# Patient Record
Sex: Female | Born: 1960
Health system: Southern US, Community
[De-identification: ages and names within clinical notes are randomized; demographics above are authoritative.]

## PROBLEM LIST (undated history)

## (undated) DIAGNOSIS — Z8742 Personal history of other diseases of the female genital tract: Secondary | ICD-10-CM

## (undated) DIAGNOSIS — F419 Anxiety disorder, unspecified: Secondary | ICD-10-CM

## (undated) DIAGNOSIS — G4730 Sleep apnea, unspecified: Secondary | ICD-10-CM

## (undated) DIAGNOSIS — E785 Hyperlipidemia, unspecified: Secondary | ICD-10-CM

## (undated) DIAGNOSIS — M1990 Unspecified osteoarthritis, unspecified site: Secondary | ICD-10-CM

## (undated) DIAGNOSIS — J45909 Unspecified asthma, uncomplicated: Secondary | ICD-10-CM

## (undated) DIAGNOSIS — F32A Depression, unspecified: Secondary | ICD-10-CM

## (undated) DIAGNOSIS — Z9889 Other specified postprocedural states: Secondary | ICD-10-CM

## (undated) DIAGNOSIS — T7840XA Allergy, unspecified, initial encounter: Secondary | ICD-10-CM

## (undated) DIAGNOSIS — M5030 Other cervical disc degeneration, unspecified cervical region: Secondary | ICD-10-CM

## (undated) DIAGNOSIS — Z9089 Acquired absence of other organs: Secondary | ICD-10-CM

## (undated) HISTORY — PX: ADENOIDECTOMY: SUR15

## (undated) HISTORY — DX: Unspecified asthma, uncomplicated: J45.909

## (undated) HISTORY — PX: TONSILLECTOMY: SUR1361

## (undated) HISTORY — DX: Unspecified osteoarthritis, unspecified site: M19.90

## (undated) HISTORY — DX: Other specified postprocedural states: Z87.42

## (undated) HISTORY — DX: Hyperlipidemia, unspecified: E78.5

## (undated) HISTORY — DX: Anxiety disorder, unspecified: F41.9

## (undated) HISTORY — PX: APPENDECTOMY: SHX54

## (undated) HISTORY — DX: Allergy, unspecified, initial encounter: T78.40XA

## (undated) HISTORY — DX: Other specified postprocedural states: Z98.890

## (undated) HISTORY — DX: Sleep apnea, unspecified: G47.30

## (undated) HISTORY — DX: Depression, unspecified: F32.A

## (undated) HISTORY — DX: Other cervical disc degeneration, unspecified cervical region: M50.30

## (undated) HISTORY — DX: Acquired absence of other organs: Z90.89

---

## 1999-11-20 ENCOUNTER — Other Ambulatory Visit: Admission: RE | Admit: 1999-11-20 | Discharge: 1999-11-20 | Payer: Self-pay | Admitting: Family Medicine

## 2002-02-10 ENCOUNTER — Other Ambulatory Visit: Admission: RE | Admit: 2002-02-10 | Discharge: 2002-02-10 | Payer: Self-pay | Admitting: Obstetrics and Gynecology

## 2004-07-26 ENCOUNTER — Inpatient Hospital Stay (HOSPITAL_COMMUNITY): Admission: EM | Admit: 2004-07-26 | Discharge: 2004-07-27 | Payer: Self-pay | Admitting: Internal Medicine

## 2005-02-19 ENCOUNTER — Other Ambulatory Visit: Admission: RE | Admit: 2005-02-19 | Discharge: 2005-02-19 | Payer: Self-pay | Admitting: Obstetrics and Gynecology

## 2005-04-01 ENCOUNTER — Encounter: Admission: RE | Admit: 2005-04-01 | Discharge: 2005-04-01 | Payer: Self-pay | Admitting: Obstetrics and Gynecology

## 2009-03-12 ENCOUNTER — Encounter: Admission: RE | Admit: 2009-03-12 | Discharge: 2009-03-12 | Payer: Self-pay | Admitting: Family Medicine

## 2009-03-12 ENCOUNTER — Other Ambulatory Visit: Admission: RE | Admit: 2009-03-12 | Discharge: 2009-03-12 | Payer: Self-pay | Admitting: Obstetrics and Gynecology

## 2009-04-30 ENCOUNTER — Emergency Department (HOSPITAL_COMMUNITY): Admission: EM | Admit: 2009-04-30 | Discharge: 2009-04-30 | Payer: Self-pay | Admitting: Emergency Medicine

## 2010-05-24 ENCOUNTER — Emergency Department (HOSPITAL_COMMUNITY): Admission: EM | Admit: 2010-05-24 | Discharge: 2010-05-24 | Payer: Self-pay | Admitting: Emergency Medicine

## 2010-12-07 ENCOUNTER — Encounter: Payer: Self-pay | Admitting: Obstetrics and Gynecology

## 2011-04-03 NOTE — Discharge Summary (Signed)
Connie Cook, Connie Cook                  ACCOUNT NO.:  [id number]   MEDICAL RECORD NO.:  [id number]                   PATIENT TYPE:  INP   LOCATION:  0473                                 FACILITY:  [facility]   PHYSICIAN:  Connie Quarry, MD                   DATE OF BIRTH:  [birth year]   DATE OF ADMISSION:  07/25/2004  DATE OF DISCHARGE:                                 DISCHARGE SUMMARY   Connie Cook is a 50 year old lady who initially presented to [facility] on July 25, 2004, with a one-week history of increasing  shortness of breath, wheezing, and cough which was productive of yellowish  phlegm. This was associated with chills, but no apparent objective fever.  The patient had been treated as an outpatient with antibiotic therapy and  albuterol with very limited improvement. A chest x-ray was obtained which  showed no evidence of pneumonia. On July 25, 2004, the patient was  admitted for inpatient treatment of these symptoms.  Physical exam at the  time of admission is described by Dr. Julio Cook to have a blood pressure of  136/86, pulse 91. The patient was afebrile. HEENT exam was within normal  limits. Examination of the chest revealed diffuse wheezing with prolongation  of expiratory phase. There were scattered rhonchi. Cardiovascular exam  revealed a mild tachycardia. There were no murmurs, rubs, or gallops. The  abdomen was benign. Normal bowel sounds without masses, tenderness, or  organomegaly. Neurologic testing and examination of extremities was normal.   On laboratory testing his sodium was 140, potassium 3.6, creatinine 0.8, BUN  12. Liver profile was normal. Hemoglobin was 12.3. BNP 44.6.   Chest x-ray was suggestive of bronchitis on admission. The patient was  placed on nebulizer treatments with albuterol. She was given Solu-Medrol  initially at a dose of 125 mg q.8h. Zithromax was administered one IV dose  and then 250 mg orally. The patient was  given Protonix 40 mg daily as  empiric therapy. The patient's condition gradually improved. I noted on her  physical examination that she had a grade 2/6 systolic murmur best heard at  the left sternal border. I suggested that an echocardiogram be performed to  characterize this, but this could not be done over the weekend. This might  be a consideration as an outpatient. By July 27, 2004, the patient was  much improved, and it was felt reasonable to go ahead and discharge her.   DISCHARGE DIAGNOSES:  1.  Acute asthmatic bronchitis.  2.  History of allergic rhinitis.  3.  Chronic anxiety.   DISCHARGE MEDICATIONS:  The patient will take Zithromax 250 mg daily for  five additional days. She will continue albuterol meter dose inhaler three  puffs q.i.d. She will follow a tapering schedule of prednisone 40 times  three, 30 times three, 20 times three, 10 times three, and then stop. She  was encouraged to follow up with Dr. Kevan Cook  in three to five days. I suggested that she could discuss her status with  Dr. Kevan Cook at that time and decision could be made about return to work.  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   CONDITION AT THE TIME OF DISCHARGE:  Good.                                               Connie Quarry, MD    SY/MEDQ  D:  07/27/2004  T:  07/27/2004  Job:  [id number]   cc:   Connie Cook, M.D.  [address]  [place]  [place] [address]  Fax: [phone]

## 2011-07-11 IMAGING — CT CT EXTREM LOW W/O CM*L*
2 of 3 series · 9 of 14 positions shown, 10 images · non-contrast
Comparison: Plain films earlier this same date.

CLINICAL DATA: Twisting injury and fall.  Pain.

CT LEFT FOOT WITHOUT CONTRAST
TECHNIQUE: Multidetector CT imaging of the left foot was performed
according to the standard protocol without intravenous contrast.
Multiplanar CT image reconstructions were also generated.

[Series 7: extremity 3.0 u90u bone · axial · 0.37mm/px · z∈[-1016,-946]mm · 4 of 39 slices shown, 5 images]
[im 8/39  soft-tissue]
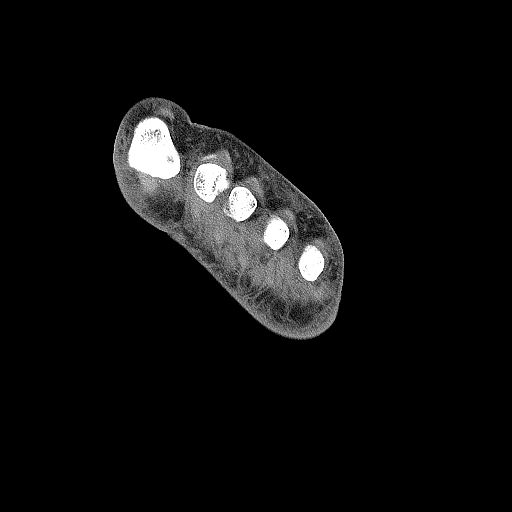
[im 8/39  bone]
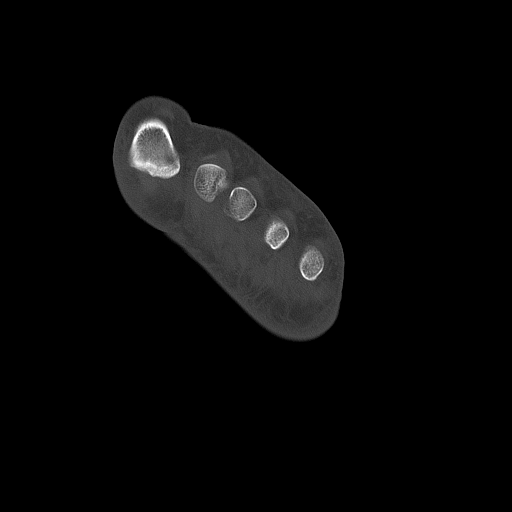
[im 16/39  bone]
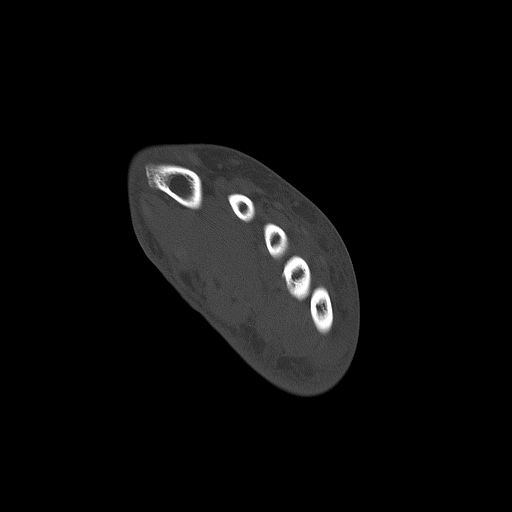
[im 23/39  bone]
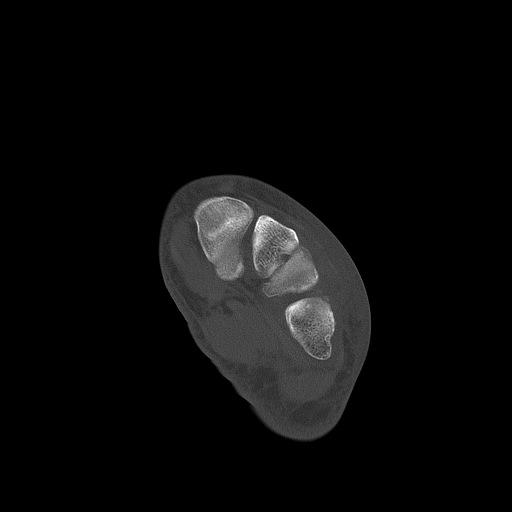
[im 31/39  bone]
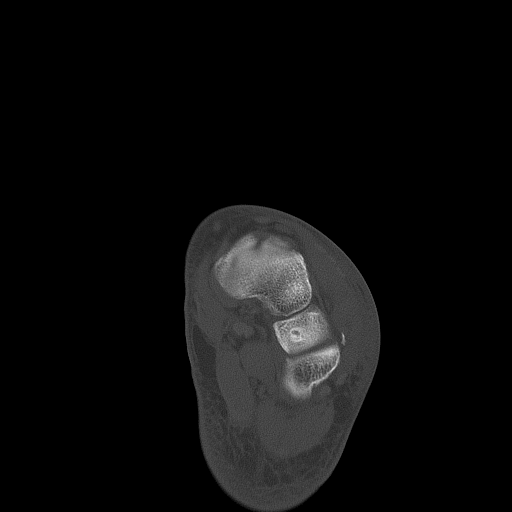

[Series 604: axial metatarsals · axial · 0.37mm/px · z∈[-985,-937]mm · 5 of 39 slices shown]
[im 7/39  bone]
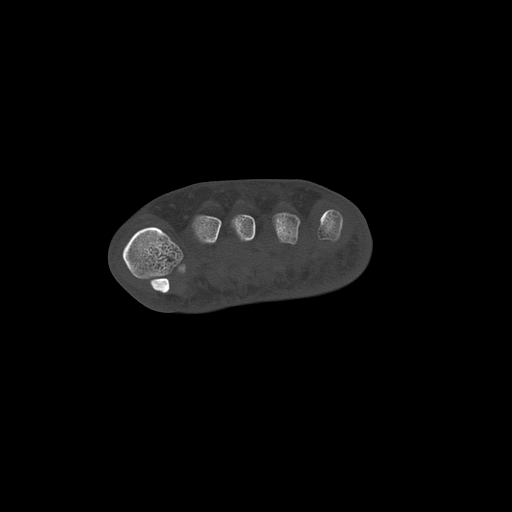
[im 13/39  bone]
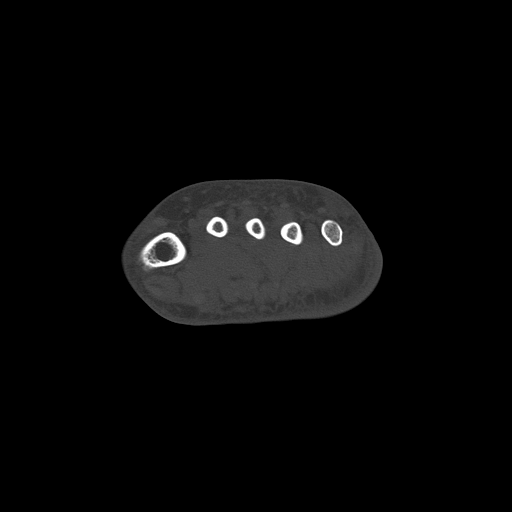
[im 20/39  bone]
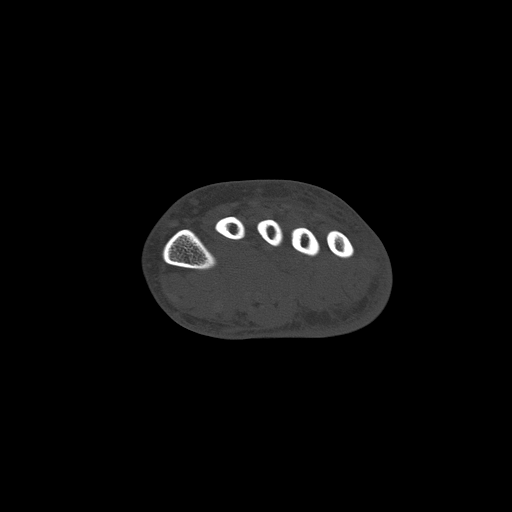
[im 26/39  bone]
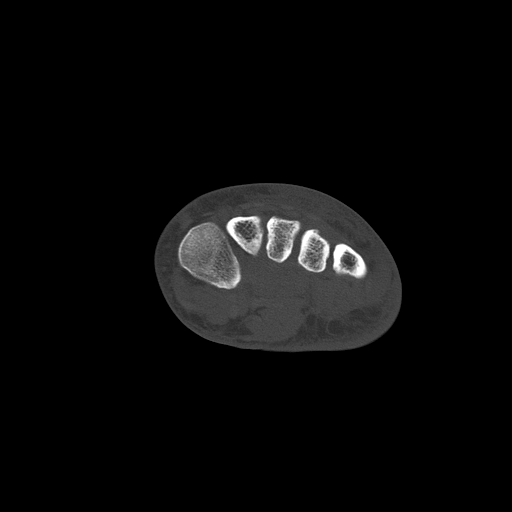
[im 32/39  bone]
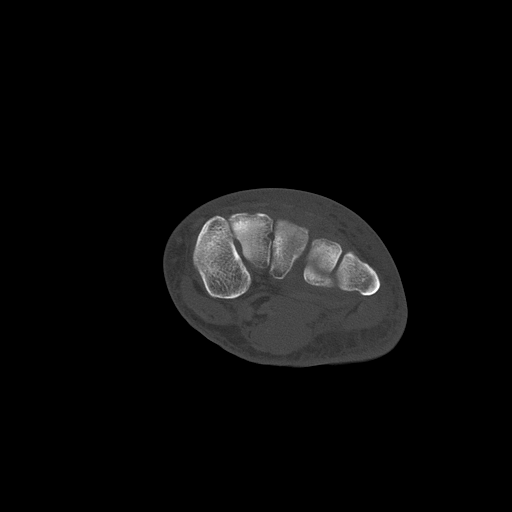

[9 of 14 positions shown; findings below may reference images not displayed]

FINDINGS: A tiny chip fracture seen off the distal aspect of the
calcaneus laterally at the expected location of the origin of the
extensor digitorum brevis  There is no other fracture.  No
dislocation.  Soft tissues of the dorsum of the foot are swollen.
IMPRESSION: Tiny chip fracture off of the distal calcaneus laterally at the
expected location of the origin of the extensor digitorum brevis.
No other fracture.  Soft tissue swelling noted.

## 2011-07-11 IMAGING — CR DG FOOT COMPLETE 3+V*L*
3 series · 3 of 3 positions shown · non-contrast
Comparison: None.

CLINICAL DATA: Injury, pain.

LEFT FOOT - COMPLETE 3+ VIEW

[t foot ap left]
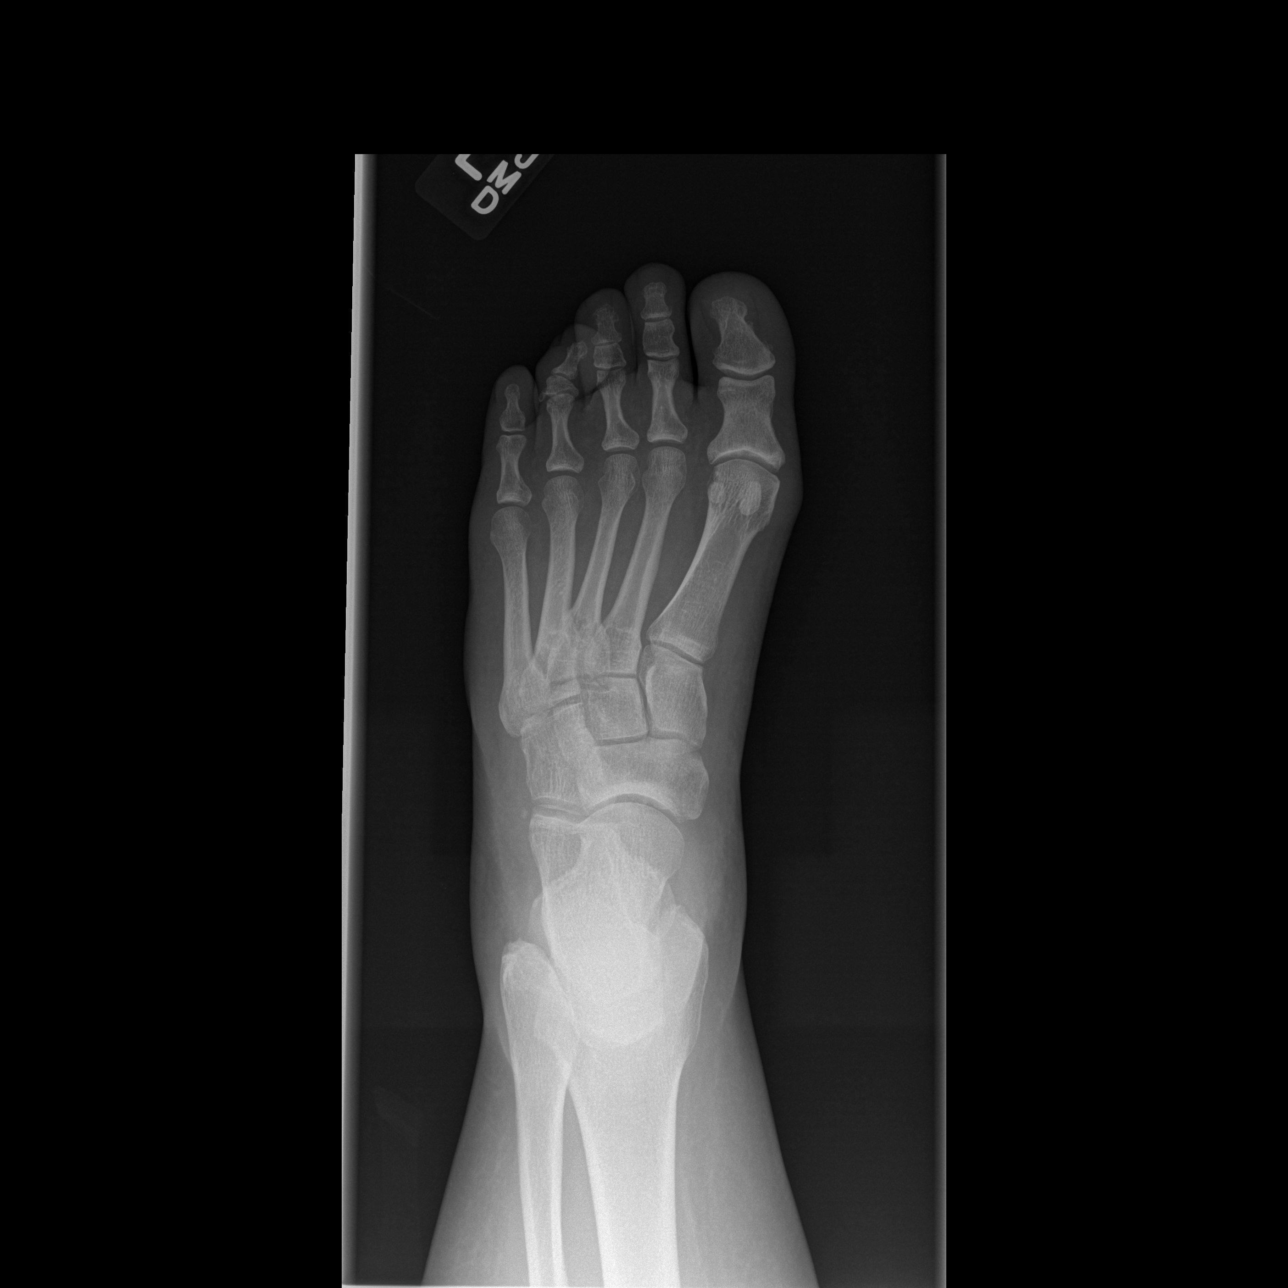

[t foot oblique left]
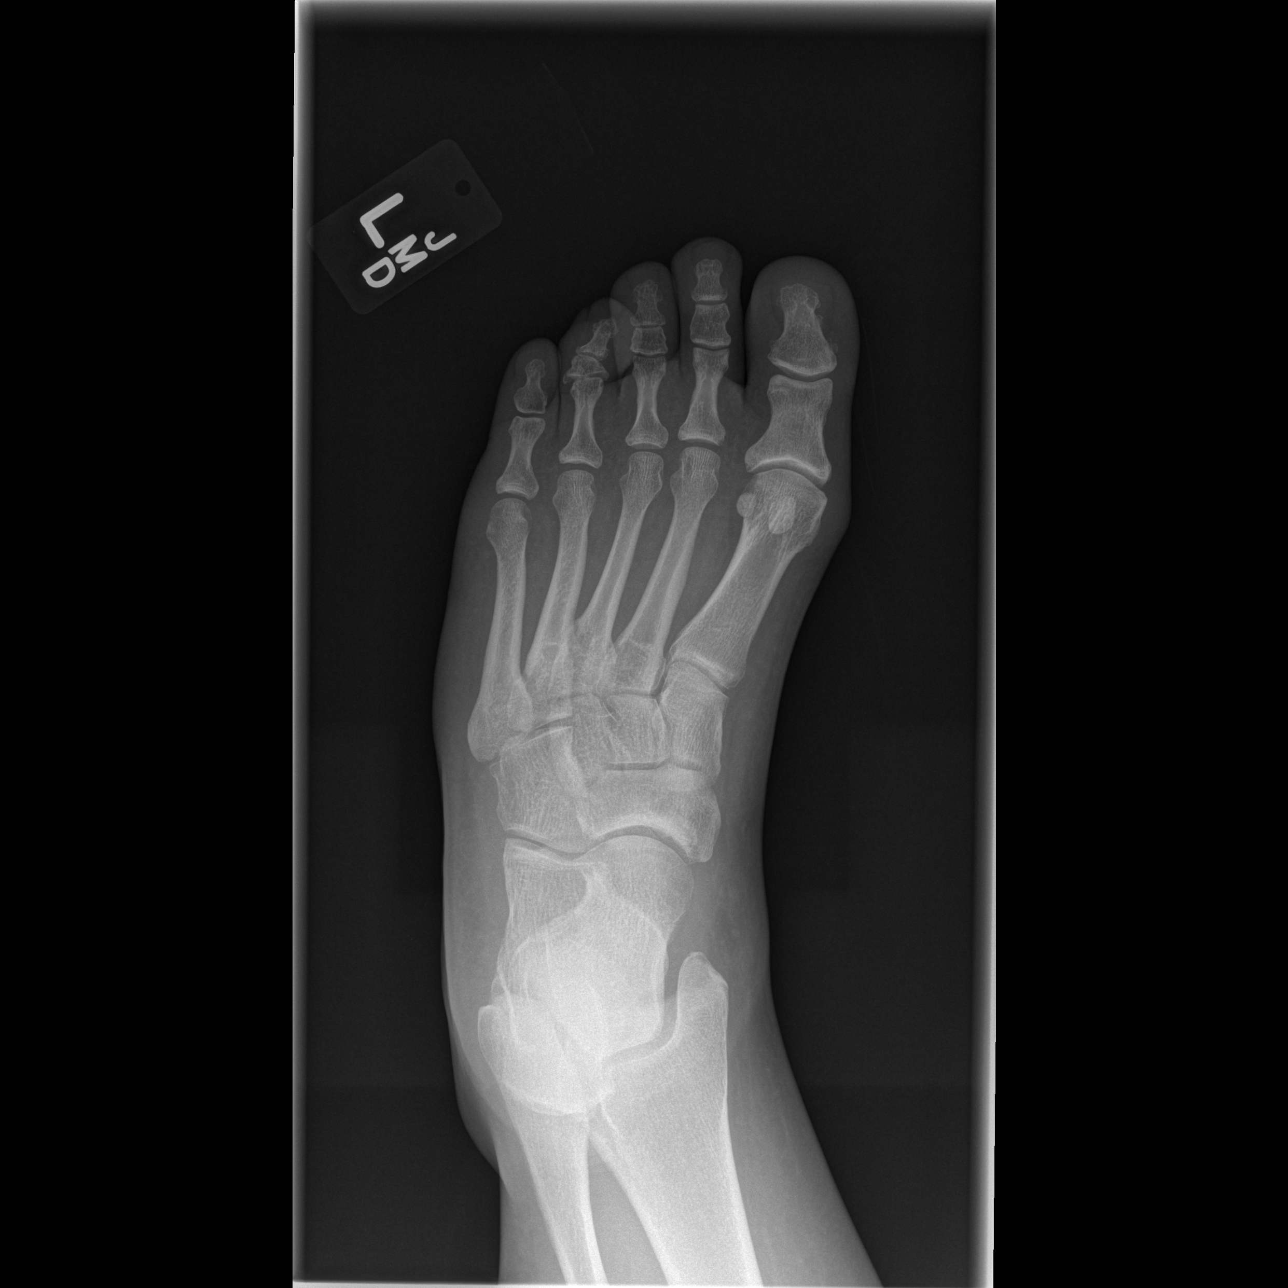

[t foot lat left]
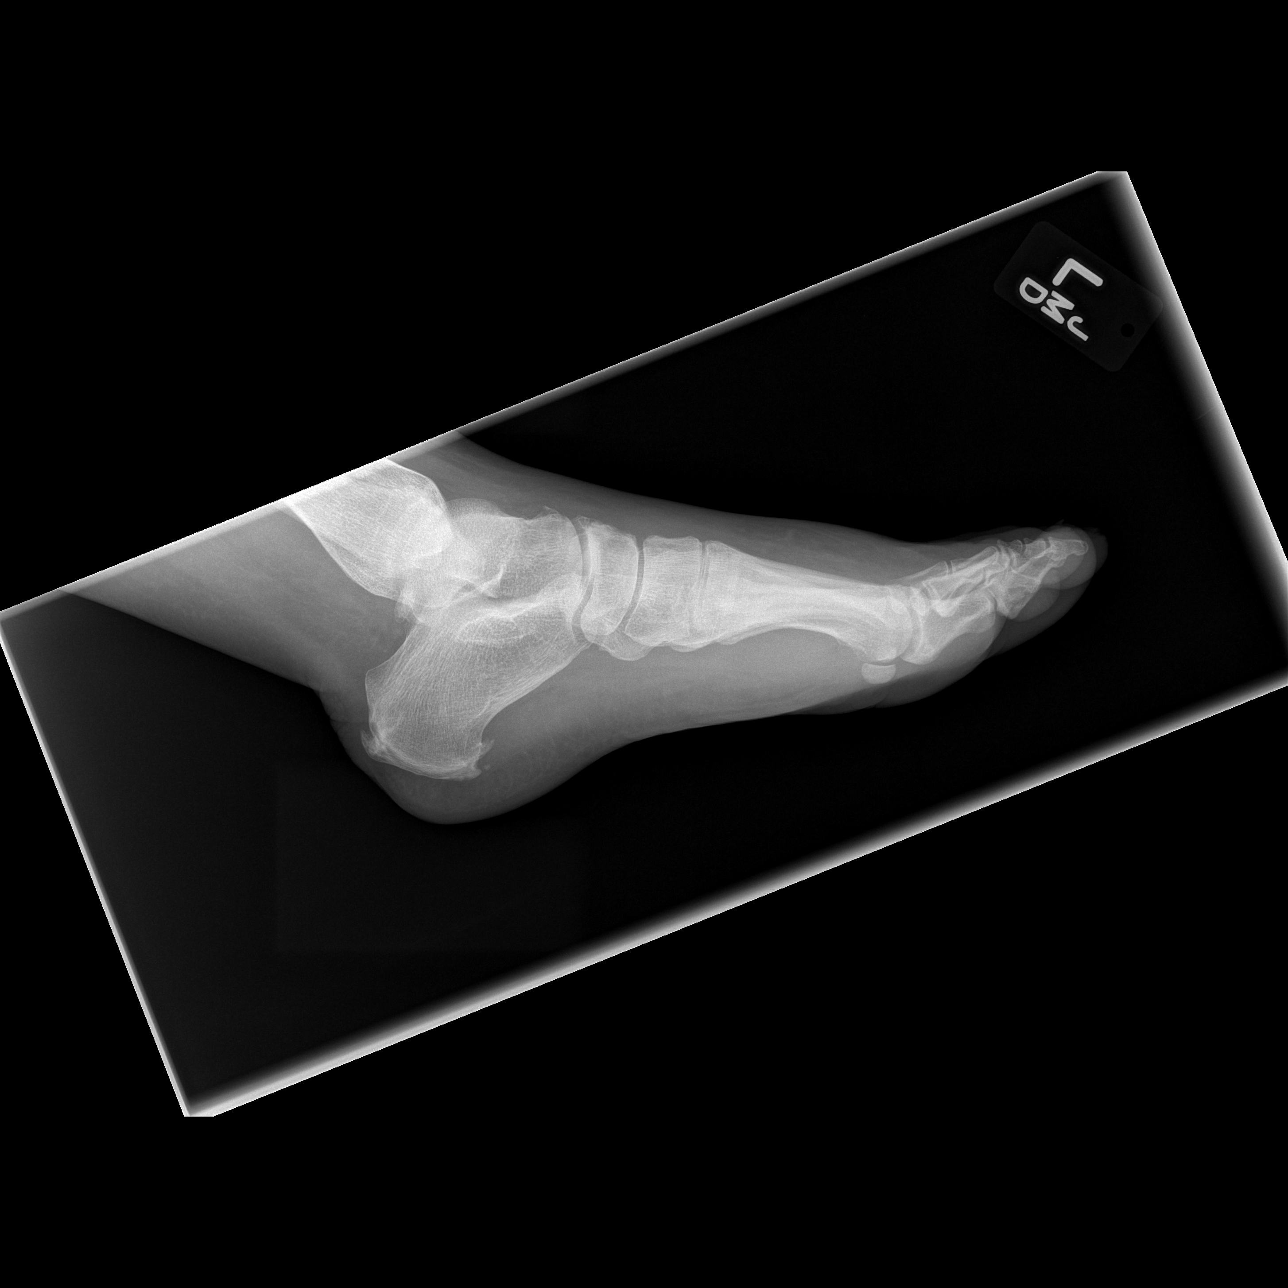

[3 of 3 positions shown; findings below may reference images not displayed]

FINDINGS: There is cortical irregularity at the bases of the second
third and fourth metatarsals.  Imaged bones otherwise unremarkable.
There is some soft tissue swelling of the dorsum of the foot.
IMPRESSION: Possible nondisplaced fractures of the bases of the second, third
and fourth metatarsals.  Clinical correlation for focal tenderness
recommended.  Finding may be due to overlapping structures.

## 2014-07-24 ENCOUNTER — Ambulatory Visit: Payer: Self-pay

## 2014-07-27 ENCOUNTER — Ambulatory Visit (INDEPENDENT_AMBULATORY_CARE_PROVIDER_SITE_OTHER): Payer: Commercial Managed Care - PPO

## 2014-07-27 DIAGNOSIS — M79675 Pain in left toe(s): Secondary | ICD-10-CM

## 2014-07-27 DIAGNOSIS — M2042 Other hammer toe(s) (acquired), left foot: Secondary | ICD-10-CM

## 2014-07-27 DIAGNOSIS — M2040 Other hammer toe(s) (acquired), unspecified foot: Secondary | ICD-10-CM

## 2014-07-27 DIAGNOSIS — M79609 Pain in unspecified limb: Secondary | ICD-10-CM

## 2014-07-27 NOTE — Progress Notes (Signed)
   Subjective:    Patient ID: Connie Cook, female    DOB: [birth year], 53 y.o.   MRN: [id number]  HPI ''LT FOOT 4TH TOE IS PAINFUL.''  PT STATED LT FOOT 4TH TOE IS BEEN PAINFUL FOR 3 MONTHS. THE TOE GETTING WORSE AND DR. Mercy Riding DIAGNOSED SPUR INSIDE OF THE TOE. THE TOE GET AGGRAVATED BY PRESSURE. TREAT WITH CORTISONE SHOT BUT NO HELP.  Review of Systems  Constitutional: Positive for activity change.  Musculoskeletal: Positive for back pain and joint swelling.  All other systems reviewed and are negative.      Objective:   Physical Exam 53 year old white female well-developed well-nourished oriented x3 presents this time a referral from Dr. Elijah Birk. Patient has a history of painful fourth toe left foot patient's x-rays confirm at this time there is some digital contracture hammertoe deformity with posse exostosis) and associated corn lateral fourth toe. The extremity objective findings vascular status appears to be intact with pedal pulses palpable DP and PT +2/4 bilateral capillary refill time 3 seconds all digits. Epicritic and proprioceptive sensations intact and symmetric bilateral normal plantar response DTRs not listed neurologically skin color pigment normal hair growth absent nails unremarkable there is keratoses lateral fifth digit compression lateral fourth digit left foot over the IP joint proximally. Orthopedic biomechanical exam reveals hammertoe deformity fourth and fifth toes with under lapping fourth toe adductovarus rotation and fifth toes were rectus x-rays confirm hammertoe deformity with some arthrosis of the middle phalanx and temperature to a proximal phalanx head. Again hammertoe deformity with associated overlying keratoses noted painful symptomatic patients tried padding changing shoes cushioning although or no success and is requesting surgery at this time. There is no open wound ulcer no secondary infection mild keratoses first 2 foam padding is dispensed at this  time.       Assessment & Plan:  Assessment hammertoe deformity with mild arthrosis of the fourth digit proximal IP joint and middle phalanx at this time is for hammertoe arthroplasty/arthrodesis with pin fixation fourth digit left foot risk and complications are reviewed with the patient all questions asked medication are answered there no contraindications to the proposed surgery and surgery proceeded scheduled at this time per patient request again likely hammertoe repair with K wire fixation under IV sedation local anesthetic block schedule surgery maintain tube foam padding in the interim will likely need out of work for proxy 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

## 2014-07-27 NOTE — Patient Instructions (Signed)
Pre-Operative Instructions  Congratulations, you have decided to take an important step to improving your quality of life.  You can be assured that the doctors of [facility] will be with you every step of the way.  1. Plan to be at the surgery center/hospital at least 1 (one) hour prior to your scheduled time unless otherwise directed by the surgical center/hospital staff.  You must have a responsible adult accompany you, remain during the surgery and drive you home.  Make sure you have directions to the surgical center/hospital and know how to get there on time. 2. For hospital based surgery you will need to obtain a history and physical form from your family physician within 1 month prior to the date of surgery- we will give you a form for you primary physician.  3. We make every effort to accommodate the date you request for surgery.  There are however, times where surgery dates or times have to be moved.  We will contact you as soon as possible if a change in schedule is required.   4. No Aspirin/Ibuprofen for one week before surgery.  If you are on aspirin, any non-steroidal anti-inflammatory medications (Mobic, Aleve, Ibuprofen) you should stop taking it 7 days prior to your surgery.  You make take Tylenol  For pain prior to surgery.  5. Medications- If you are taking daily heart and blood pressure medications, seizure, reflux, allergy, asthma, anxiety, pain or diabetes medications, make sure the surgery center/hospital is aware before the day of surgery so they may notify you which medications to take or avoid the day of surgery. 6. No food or drink after midnight the night before surgery unless directed otherwise by surgical center/hospital staff. 7. No alcoholic beverages 24 hours prior to surgery.  No smoking 24 hours prior to or 24 hours after surgery. 8. Wear loose pants or shorts- loose enough to fit over bandages, boots, and casts. 9. No slip on shoes, sneakers are best. 10. Bring  your boot with you to the surgery center/hospital.  Also bring crutches or a walker if your physician has prescribed it for you.  If you do not have this equipment, it will be provided for you after surgery. 11. If you have not been contracted by the surgery center/hospital by the day before your surgery, call to confirm the date and time of your surgery. 12. Leave-time from work may vary depending on the type of surgery you have.  Appropriate arrangements should be made prior to surgery with your employer. 13. Prescriptions will be provided immediately following surgery by your doctor.  Have these filled as soon as possible after surgery and take the medication as directed. 14. Remove nail polish on the operative foot. 15. Wash the night before surgery.  The night before surgery wash the foot and leg well with the antibacterial soap provided and water paying special attention to beneath the toenails and in between the toes.  Rinse thoroughly with water and dry well with a towel.  Perform this wash unless told not to do so by your physician.  Enclosed: 1 Ice pack (please put in freezer the night before surgery)   1 Hibiclens skin cleaner   Pre-op Instructions  If you have any questions regarding the instructions, do not hesitate to call our office.  [place]: [address] [place], [place] [address] [phone]  [place]: [address] [place], [place] [address] [phone]  [place]: [address]  , [place] [address] [phone]  Dr. Mollyann Halbert   Tuchman DPM, Dr. Norman Regal DPM Dr. Josiah Wojtaszek DPM, Dr. M. Todd Hyatt DPM, Dr. Kathryn Egerton DPM 

## 2014-07-30 ENCOUNTER — Telehealth: Payer: Self-pay | Admitting: *Deleted

## 2014-07-30 NOTE — Telephone Encounter (Signed)
I want to know if it's possible that Dr. Ralene Cork does surgeries at the hospital opposed to the surgical center?  Just want to know because I know the hospital makes payment arrangements.  Thank you, bye bye.  I called and left her a message that Dr. Ralene Cork does surgery at [facility] on rare occasions.  If you decide to go that route, you will have to have a physical done 7 days prior to surgery and you will have to see Dr. Ralene Cork again for a consultation to sign another consent form for [facility].  Call on tomorrow if you have any further questions.

## 2014-08-06 DIAGNOSIS — M2040 Other hammer toe(s) (acquired), unspecified foot: Secondary | ICD-10-CM

## 2014-08-08 ENCOUNTER — Telehealth: Payer: Self-pay | Admitting: *Deleted

## 2014-08-08 NOTE — Telephone Encounter (Signed)
I want to know if I can drive.  I have a doctor's appointment, that was set up ahead of time, tomorrow for a physical.  If I need to cancel it I will.  Give me a call.    I called and left the patient a message that she can drive but not under the influence of the narcotic.  Okay to drive being that it is your left foot.

## 2014-08-10 ENCOUNTER — Telehealth: Payer: Self-pay

## 2014-08-10 NOTE — Telephone Encounter (Signed)
Spoke with pt regarding post operative status, she states that she is doing well and is managing her pain effectively. Advised to remain in boot/shoe at all times and t o ice and elevate.

## 2014-08-14 ENCOUNTER — Ambulatory Visit (INDEPENDENT_AMBULATORY_CARE_PROVIDER_SITE_OTHER): Payer: Commercial Managed Care - PPO

## 2014-08-14 VITALS — BP 156/90 | HR 93 | Resp 14

## 2014-08-14 DIAGNOSIS — Z09 Encounter for follow-up examination after completed treatment for conditions other than malignant neoplasm: Secondary | ICD-10-CM

## 2014-08-14 DIAGNOSIS — M2040 Other hammer toe(s) (acquired), unspecified foot: Secondary | ICD-10-CM

## 2014-08-14 NOTE — Patient Instructions (Signed)

## 2014-08-14 NOTE — Progress Notes (Signed)
   Subjective:    Patient ID: Connie RutherfordPatsy [name] Cook, female    DOB: 10/24/1961, 53 y.o.   MRN: [id number]  HPI Comments: DOS 08/06/2014 left 4th hammer toe repair with pin.  Pt states she is doing fine, but has a cold.     Review of Systems no new findings or systemic changes noted     Objective:   Physical Exam Neurovascular status appears to be intact pedal pulses palpable dressings intact and dry however should note that the pin and the fourth toe left foot has backed out not in position [facility] taken at this time demonstrated the pain is only in the distal phalanx and middle phalanx is then extended to the proximal not providing a stability this time the pain is easily removed from the end of the toe minimal or no drainage or bleeding noted minimal edema and ecchymosis consistent with postop course at this time and dressed to compressive dressing was reapplied to the left foot maintaining the toe in a rectus position with with wrapping and Coflex patient will maintain Darco shoe as instructed. Should note neurovascular status is intact pedal pulses palpable adequate resection of prominent bone was noted on x-ray although fixation did back out prematurely removed.       Assessment & Plan:  Assessment postop progress following hammertoe repair excision portion middle phalanx fourth toe left foot. Coflex wrap in reapplied in dry sterile dressing reapplied to left foot return weekly for suture removal at that time the dressings intact and dry he is having minimal or no pain or discomfort clinical good appearance to the digit is noted. Dressing reapplied recheck in one week suture removal plan at that time  Alvan Dameichard Evah Rashid DPM

## 2014-08-17 ENCOUNTER — Telehealth: Payer: Self-pay | Admitting: *Deleted

## 2014-08-17 NOTE — Telephone Encounter (Signed)
The pin he put in my toe had come out at my last visit.  I just want to make sure my toe is going to be okay.  Will he go in and put another pin in it?  I told her probably not.  I told her it should be okay.  She stated, "Okay thank you."

## 2014-08-21 ENCOUNTER — Ambulatory Visit (INDEPENDENT_AMBULATORY_CARE_PROVIDER_SITE_OTHER): Payer: Commercial Managed Care - PPO

## 2014-08-21 VITALS — Resp 14 | Ht 60.5 in | Wt 200.0 lb

## 2014-08-21 DIAGNOSIS — M2042 Other hammer toe(s) (acquired), left foot: Secondary | ICD-10-CM

## 2014-08-21 DIAGNOSIS — Z09 Encounter for follow-up examination after completed treatment for conditions other than malignant neoplasm: Secondary | ICD-10-CM

## 2014-08-21 NOTE — Patient Instructions (Signed)
ANTIBACTERIAL SOAP INSTRUCTIONS  THE DAY AFTER PROCEDURE  Please follow the instructions your doctor has marked.   Shower as usual. Before getting out, place a drop of antibacterial liquid soap (Dial) on a wet, clean washcloth.  Gently wipe washcloth over affected area.  Afterward, rinse the area with warm water.  Blot the area dry with a soft cloth and cover with antibiotic ointment (neosporin, polysporin, bacitracin) and band aid or gauze and tape  Place 3-4 drops of antibacterial liquid soap in a quart of warm tap water.  Submerge foot into water for 20 minutes.  If bandage was applied after your procedure, leave on to allow for easy lift off, then remove and continue with soak for the remaining time.  Next, blot area dry with a soft cloth and cover with a bandage.  Apply other medications as directed by your doctor, such as cortisporin otic solution (eardrops) or neosporin antibiotic ointment   May resume normal bathing or showering. Maintain Coflex wrap in of toes 34 and 5-6 maintain stability of toe for at least another 3 or 4 week

## 2014-08-21 NOTE — Progress Notes (Signed)
   Subjective:    Patient ID: Connie RutherfordPatsy [name] Cook, female    DOB: 09/04/1961, 53 y.o.   MRN: [id number]  HPI Comments: DOS 08/06/2014 left 4th hammer toe repair with pin.  Pt present for suture removal today, not complains except some tenderness, from possibly being up too long on it at work yesterday.     Review of Systems no new findings or systemic changes noted    Objective:   Physical Exam Neurovascular status is intact good postop progress sutures are intact and the fourth toe left foot sutures removed at this time. Neosporin and Coflex wrapping of the toe is carried out to maintain stability as patient's K wires mild early. No pain or discomfort noted no dehiscence no discharge no signs of infection       Assessment & Plan:  Assessment good postop progress neurovascular status is intact maintain Coflex buddy wrap in of toes 34 and 5 to stabilize the fourth toe. May resume normal bathing and hygiene starting tomorrow and maintain Neosporin cocoa butter to the incision recheck in 4 weeks for long-term followup and x-ray  Alvan Dameichard Loraine Bhullar DPM

## 2014-08-22 NOTE — Progress Notes (Signed)
Dr Blenda Mounts performed a left 4th met hammertoe repair on 08/06/14

## 2014-09-18 ENCOUNTER — Ambulatory Visit (INDEPENDENT_AMBULATORY_CARE_PROVIDER_SITE_OTHER): Payer: Commercial Managed Care - PPO

## 2014-09-18 VITALS — BP 152/89 | HR 75 | Resp 14

## 2014-09-18 DIAGNOSIS — M2042 Other hammer toe(s) (acquired), left foot: Secondary | ICD-10-CM

## 2014-09-18 DIAGNOSIS — Z09 Encounter for follow-up examination after completed treatment for conditions other than malignant neoplasm: Secondary | ICD-10-CM

## 2014-09-18 NOTE — Progress Notes (Signed)
   Subjective:    Patient ID: Connie RutherfordPatsy [name] Cook, female    DOB: 12/10/1960, 53 y.o.   MRN: [id number]  HPI Comments: Frederich ChaDos 08/06/2014 left 4th hammer toe repair with pin.  Pt states she is doing well and continuess to buddy tape left 2, 3, 4th toes.     Review of Systemsno new findings or systemic changes noted     Objective:   Physical Exam Patient presents this time 6 week status post hammertoe repair fourth digit left foot incisions well coapted still some mild edema noted patient is been buddy wrapping her toes with Coflex as instructed x-rays reveal good position of the digit with adequate resection of bone of the middle phalanx proximal phalanx head is good derotation of the digit noted clinically and radiographically. 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 Sedation well coapted no pain or discomfort mild edema still present       Assessment & Plan:  Assessment good postop progress following hammertoe repair fourth left plan at this time Coflex wrap and applied to the toe reappointed in approximately 2 months for long-term postop follow-up a discontinue Darco or surgical shoe and return to good walking tennis or athletic shoe however no flimsy shoes no flip-flops no barefoot walking maintain a stable shoe with a solid shank or arch contact us visiting changes or exacerbations begin recheck in approximately 2 months for postop follow-up and likely discharged thereafter. Next  Alvan Dameichard Kirtan Sada DPM

## 2014-09-18 NOTE — Patient Instructions (Signed)

## 2014-11-13 ENCOUNTER — Ambulatory Visit (INDEPENDENT_AMBULATORY_CARE_PROVIDER_SITE_OTHER): Payer: Commercial Managed Care - PPO

## 2014-11-13 VITALS — BP 119/74 | HR 80 | Resp 12

## 2014-11-13 DIAGNOSIS — M2042 Other hammer toe(s) (acquired), left foot: Secondary | ICD-10-CM

## 2014-11-13 DIAGNOSIS — Z09 Encounter for follow-up examination after completed treatment for conditions other than malignant neoplasm: Secondary | ICD-10-CM

## 2014-11-13 NOTE — Patient Instructions (Signed)
ICE INSTRUCTIONS  Apply ice or cold pack to the affected area at least 3 times a day for 10-15 minutes each time.  You should also use ice after prolonged activity or vigorous exercise.  Do not apply ice longer than 20 minutes at one time.  Always keep a cloth between your skin and the ice pack to prevent burns.  Being consistent and following these instructions will help control your symptoms.  We suggest you purchase a gel ice pack because they are reusable and do bit leak.  Some of them are designed to wrap around the area.  Use the method that works best for you.  Here are some other suggestions for icing.   Use a frozen bag of peas or corn-inexpensive and molds well to your body, usually stays frozen for 10 to 20 minutes.  Wet a towel with cold water and squeeze out the excess until it's damp.  Place in a bag in the freezer for 20 minutes. Then remove and use.  Postoperative swelling can last as long as 3-6 months after surgery on the toe. Maintain compression wrap and maintain ice to help reduce swelling as needed

## 2014-11-13 NOTE — Progress Notes (Signed)
   Subjective:    Patient ID: Connie RutherfordPatsy [name] Cook, female    DOB: 07/10/1961, 53 y.o.   MRN: [id number]  HPI  Dos 08/06/2014 left 4th hammer toe repair with pin. ''LT FOOT 4TH TOE STILL LITTLE SORE BUT LOOKING MUCH BETTER.''  Review of Systems No new findings or systemic changes    Objective:   Physical Exam Neurovascular status is intact with pedal pulses palpable epicritic and proprioceptive sensations intact and symmetric there is normal plantar response DTRs not elicited still mild edema fourth toe consistent with postop course due to last 3-6 months postoperatively. Good clinical and radiographic alignment identified at this time. No open wounds no ulcers no dehiscence no secondary infections       Assessment & Plan:  Assessment this time good postop progress resolution following hammertoe repair fourth digit left foot continue following the future and as-needed basis there is any further exacerbations or difficulties Brooke DareKing is elevation ice and maintain Coflex wrapping the future to help with edema if needed otherwise discharged from our care for further follow-up  Alvan Dameichard Dorise Gangi DPM

## 2015-10-03 ENCOUNTER — Other Ambulatory Visit: Payer: Self-pay

## 2015-10-03 DIAGNOSIS — Z1231 Encounter for screening mammogram for malignant neoplasm of breast: Secondary | ICD-10-CM

## 2015-10-14 ENCOUNTER — Other Ambulatory Visit: Payer: Self-pay | Admitting: Internal Medicine

## 2015-10-14 DIAGNOSIS — M25512 Pain in left shoulder: Secondary | ICD-10-CM

## 2015-10-19 ENCOUNTER — Ambulatory Visit
Admission: RE | Admit: 2015-10-19 | Discharge: 2015-10-19 | Disposition: A | Discharge status: Home / Self Care | Payer: Commercial Managed Care - PPO | Source: Ambulatory Visit | Attending: Internal Medicine | Admitting: Internal Medicine

## 2015-10-19 DIAGNOSIS — M25512 Pain in left shoulder: Secondary | ICD-10-CM

## 2015-10-28 ENCOUNTER — Ambulatory Visit: Payer: Self-pay

## 2017-03-10 ENCOUNTER — Ambulatory Visit (HOSPITAL_COMMUNITY): Payer: Commercial Managed Care - PPO | Admitting: Psychiatry

## 2017-06-28 ENCOUNTER — Ambulatory Visit (HOSPITAL_COMMUNITY): Payer: Commercial Managed Care - PPO | Admitting: Psychiatry

## 2017-07-16 ENCOUNTER — Other Ambulatory Visit: Payer: Self-pay | Admitting: Family Medicine

## 2017-07-16 DIAGNOSIS — Z1231 Encounter for screening mammogram for malignant neoplasm of breast: Secondary | ICD-10-CM

## 2017-07-22 ENCOUNTER — Ambulatory Visit
Admission: RE | Admit: 2017-07-22 | Discharge: 2017-07-22 | Disposition: A | Discharge status: Home / Self Care | Payer: Managed Care, Other (non HMO) | Source: Ambulatory Visit | Attending: Family Medicine | Admitting: Family Medicine

## 2017-07-22 DIAGNOSIS — Z1231 Encounter for screening mammogram for malignant neoplasm of breast: Secondary | ICD-10-CM

## 2020-01-16 ENCOUNTER — Ambulatory Visit: Payer: Managed Care, Other (non HMO)

## 2020-01-22 ENCOUNTER — Ambulatory Visit: Payer: Self-pay

## 2020-01-29 ENCOUNTER — Ambulatory Visit: Payer: Managed Care, Other (non HMO) | Attending: Internal Medicine

## 2020-01-29 DIAGNOSIS — Z23 Encounter for immunization: Secondary | ICD-10-CM

## 2020-01-29 NOTE — Progress Notes (Signed)
   Covid-19 Vaccination Clinic  Name:  Shaterrica Territo    MRN: [id number] DOB: [birth year]  01/29/2020  Ms. Placide was observed post Covid-19 immunization for 15 minutes without incident. She was provided with Vaccine Information Sheet and instruction to access the V-Safe system.   Ms. Noy was instructed to call 911 with any severe reactions post vaccine: Marland Kitchen Difficulty breathing  . Swelling of face and throat  . A fast heartbeat  . A bad rash all over body  . Dizziness and weakness   Immunizations Administered    Name Date Dose VIS Date Route   Pfizer COVID-19 Vaccine 01/29/2020  1:20 PM 0.3 mL 10/27/2019 Intramuscular   Manufacturer: [employer], [employer]   Lot: [id number]   NDC: [address]-0

## 2020-02-20 ENCOUNTER — Ambulatory Visit: Payer: Self-pay

## 2020-02-20 ENCOUNTER — Ambulatory Visit: Payer: Managed Care, Other (non HMO) | Attending: Internal Medicine

## 2020-02-20 DIAGNOSIS — Z23 Encounter for immunization: Secondary | ICD-10-CM

## 2020-02-20 NOTE — Progress Notes (Signed)
   Covid-19 Vaccination Clinic  Name:  Connie Cook    MRN: [id number] DOB: 25-Nov-1960  02/20/2020  Connie Cook was observed post Covid-19 immunization for 15 minutes without incident. She was provided with Vaccine Information Sheet and instruction to access the V-Safe system.   Connie Cook was instructed to call 911 with any severe reactions post vaccine: Marland Kitchen Difficulty breathing  . Swelling of face and throat  . A fast heartbeat  . A bad rash all over body  . Dizziness and weakness   Immunizations Administered    Name Date Dose VIS Date Route   Pfizer COVID-19 Vaccine 02/20/2020  3:50 PM 0.3 mL 10/27/2019 Intramuscular   Manufacturer: [employer], [employer]   Lot: [id number]   NDC: [address]-2

## 2020-11-19 ENCOUNTER — Ambulatory Visit (HOSPITAL_COMMUNITY)
Admission: RE | Admit: 2020-11-19 | Discharge: 2020-11-19 | Disposition: A | Discharge status: Home / Self Care | Payer: Self-pay | Attending: Psychiatry | Admitting: Psychiatry

## 2020-11-19 DIAGNOSIS — G479 Sleep disorder, unspecified: Secondary | ICD-10-CM | POA: Insufficient documentation

## 2020-11-19 DIAGNOSIS — R450 Nervousness: Secondary | ICD-10-CM | POA: Insufficient documentation

## 2020-11-19 DIAGNOSIS — F411 Generalized anxiety disorder: Secondary | ICD-10-CM | POA: Insufficient documentation

## 2020-11-19 DIAGNOSIS — F332 Major depressive disorder, recurrent severe without psychotic features: Secondary | ICD-10-CM | POA: Insufficient documentation

## 2020-11-19 DIAGNOSIS — F341 Dysthymic disorder: Secondary | ICD-10-CM | POA: Insufficient documentation
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
## 2020-11-19 NOTE — H&P (Signed)
Behavioral Health Medical Screening Exam  Connie Cook is an 60 y.o. female. She reports history of depression for years with passive SI partly related to stress from a new job. She reports low energy and motivation. She has taken Zoloft and PRN Ativan for years from her PCP. She denies current SI and contracts for safety. Denies HI/AVH. Denies drug/alcohol use. She reports having a good friend and sister who she can reach out to for help in case of a crisis. Patient is also provided with suicide hotline information. Discussed PHP through [facility], and patient is agreeable to this plan.  Total Time spent with patient: 30 minutes  Psychiatric Specialty Exam: Physical Exam Constitutional:      Appearance: She is well-developed and well-nourished.  Pulmonary:     Effort: Pulmonary effort is normal.  Musculoskeletal:        General: Normal range of motion.  Neurological:     Mental Status: She is alert and oriented to person, place, and time.    Review of Systems  Constitutional: Negative.   Respiratory: Negative for cough and shortness of breath.   Psychiatric/Behavioral: Positive for decreased concentration, dysphoric mood and sleep disturbance (inconsistent sleep patterns). Negative for agitation, behavioral problems, confusion, hallucinations, self-injury and suicidal ideas. The patient is nervous/anxious. The patient is not hyperactive.    There were no vitals taken for this visit.There is no height or weight on file to calculate BMI. General Appearance: Casual Eye Contact:  Good Speech:  Normal Rate Volume:  Normal Mood:  Anxious Affect:  Congruent Thought Process:  Coherent and Goal Directed Orientation:  Full (Time, Place, and Person) Thought Content:  Logical Suicidal Thoughts:  No Homicidal Thoughts:  No Memory:  Immediate;   Good Recent;   Good Remote;   Good Judgement:  Intact Insight:  Fair Psychomotor Activity:  Normal Concentration: Concentration: Good and  Attention Span: Good Recall:  Good Fund of Knowledge:Fair Language: Good Akathisia:  No Handed:  Right AIMS (if indicated):    Assets:  Communication Skills Desire for Improvement Housing Resilience Social Support Sleep:     Musculoskeletal: Strength & Muscle Tone: within normal limits Gait & Station: normal Patient leans: N/A  There were no vitals taken for this visit.  Recommendations: Based on my evaluation the patient does not appear to have an emergency medical condition.  Partial hospitalization program.  Aldean Baker, NP 11/19/2020, 4:16 PM

## 2020-11-20 ENCOUNTER — Telehealth (HOSPITAL_COMMUNITY): Payer: Self-pay | Admitting: Professional

## 2020-11-22 ENCOUNTER — Ambulatory Visit (HOSPITAL_COMMUNITY): Payer: No Payment, Other | Admitting: Professional

## 2020-11-22 ENCOUNTER — Other Ambulatory Visit: Payer: Self-pay

## 2020-11-22 DIAGNOSIS — F332 Major depressive disorder, recurrent severe without psychotic features: Secondary | ICD-10-CM

## 2020-11-25 ENCOUNTER — Ambulatory Visit (INDEPENDENT_AMBULATORY_CARE_PROVIDER_SITE_OTHER): Payer: No Payment, Other | Admitting: Professional

## 2020-11-25 ENCOUNTER — Other Ambulatory Visit: Payer: Self-pay

## 2020-11-25 DIAGNOSIS — F332 Major depressive disorder, recurrent severe without psychotic features: Secondary | ICD-10-CM | POA: Diagnosis not present
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
## 2020-11-26 ENCOUNTER — Encounter (HOSPITAL_COMMUNITY): Payer: Self-pay | Admitting: Professional

## 2020-11-26 ENCOUNTER — Ambulatory Visit (INDEPENDENT_AMBULATORY_CARE_PROVIDER_SITE_OTHER): Payer: No Payment, Other | Admitting: Professional

## 2020-11-26 DIAGNOSIS — F332 Major depressive disorder, recurrent severe without psychotic features: Secondary | ICD-10-CM | POA: Diagnosis not present

## 2020-11-26 NOTE — Progress Notes (Signed)
Spiritual care group    11:00-12:00  Group met via web-ex due to COVID-19 precautions.  Group facilitated by Simone Curia, MDiv, BCC   Group focused on topic of "self-care"  Patients engaged in facilitated discussion about topic.  Explored quotes related to self care and chose one which they agreed with and one which they disliked.  Engaged in discussion around quote choices and their experience / understanding of care for themselves.    Connie Cook was present throughout group. Identified self care as being "well rounded" and caring for self in all aspects of life.  Noted that this has been challenging.  Pt expressed affirmation of another group member's description of isolating because its challenging to explain to others how one is feeling.  Described relationship with sister, who "asks questions" and "tries to sugar coat things."  Noted that this feels isolating and reframed that she needs empathy.  She described not addressing this directly with sister, but rather not answering sister's questions as a way of setting boundary.   She resonated with a quote "look into your heart and if you find nothing wrong there, what is there to worry about..."   Spoke about being connected with "trying my best" and "doing what is right for me" and that this can help feelings of "shame and guilt."

## 2020-11-27 ENCOUNTER — Other Ambulatory Visit: Payer: Self-pay

## 2020-11-27 ENCOUNTER — Ambulatory Visit (INDEPENDENT_AMBULATORY_CARE_PROVIDER_SITE_OTHER): Payer: No Payment, Other | Admitting: Professional

## 2020-11-27 DIAGNOSIS — F332 Major depressive disorder, recurrent severe without psychotic features: Secondary | ICD-10-CM

## 2020-11-28 ENCOUNTER — Ambulatory Visit (INDEPENDENT_AMBULATORY_CARE_PROVIDER_SITE_OTHER): Payer: No Payment, Other | Admitting: Professional

## 2020-11-28 DIAGNOSIS — F332 Major depressive disorder, recurrent severe without psychotic features: Secondary | ICD-10-CM

## 2020-11-29 ENCOUNTER — Telehealth (HOSPITAL_COMMUNITY): Payer: Self-pay | Admitting: Professional

## 2020-11-29 ENCOUNTER — Other Ambulatory Visit: Payer: Self-pay

## 2020-11-29 ENCOUNTER — Encounter (HOSPITAL_COMMUNITY): Payer: Self-pay

## 2020-11-29 ENCOUNTER — Ambulatory Visit (INDEPENDENT_AMBULATORY_CARE_PROVIDER_SITE_OTHER): Payer: No Payment, Other | Admitting: Professional

## 2020-11-29 DIAGNOSIS — F332 Major depressive disorder, recurrent severe without psychotic features: Secondary | ICD-10-CM

## 2020-11-29 NOTE — Progress Notes (Signed)
Spoke with patient via Webex video call, used 2 identifiers to correctly identify patient. She was referred to [facility] by [facility]She was feeling suicidal and dealing with depression and anxiety. This is her first time in the program. Groups are going well. No side effects from medication. Denies SI/HI or AV hallucinations. On scale 1-10 as 10 being worst she rates depression at 7 and anxiety at 6. PHQ9=22. No issues or complaints.

## 2020-12-02 ENCOUNTER — Ambulatory Visit (HOSPITAL_COMMUNITY): Payer: No Payment, Other

## 2020-12-02 NOTE — Psych (Signed)
Virtual Visit via Video Note  I connected with Connie Cook on 11/28/20 at  9:00 AM EST by a video enabled telemedicine application and verified that I am speaking with the correct person using two identifiers.  Location: Patient: Home Provider: Clinical Home Office   I discussed the limitations of evaluation and management by telemedicine and the availability of in person appointments. The patient expressed understanding and agreed to proceed.  Follow Up Instructions:    I discussed the assessment and treatment plan with the patient. The patient was provided an opportunity to ask questions and all were answered. The patient agreed with the plan and demonstrated an understanding of the instructions.   The patient was advised to call back or seek an in-person evaluation if the symptoms worsen or if the condition fails to improve as anticipated.  I provided 240 minutes of non-face-to-face time during this encounter.   Quinn Axe, [facility]    [facility] PHP THERAPIST PROGRESS NOTE  Connie Cook [id number]  Session Time: 9-1  Participation Level: Active  Behavioral Response: CasualAlertAnxious and Depressed  Type of Therapy: Group Therapy  Treatment Goals addressed: Coping  Interventions: CBT, DBT, Solution Focused, Strength-based, Supportive and Reframing  Summary:  Clinician led check-in regarding current stressors and situation, and review of patient completed daily inventory. Clinician utilized active listening and empathetic response and validated patient emotions. Clinician facilitated processing group on pertinent issues.  Therapist Response: Connie Cook is a 60 y.o. female who presents with depression and anxiety symptoms. Patient arrived within time allowed and reports that she is feeling "ok but tired." Patient rates her mood at a 4 on a scale of 1-10 with 10 being great. Pt rates her depression at a 5 on a scale of 1-10, 1 being low and  10 being high; and her anxiety at a 7, 1 being low and 10 being high. Pt reports she did not sleep well which is clouding mood. Pt reports she had a lot of ruminations about jobs over lifetime and "mistakes" made. Pt able to process. Pt engaged in discussion.     Session Time: 10:00 -11:00   Participation Level: Active   Behavioral Response: CasualAlertDepressed   Type of Therapy: Group Therapy   Treatment Goals addressed: Coping   Interventions: CBT, DBT, Solution Focused, Supportive and Reframing   Summary:  Clinician introduced topic of "Radical Acceptance."  Group discussed how and when radical acceptance can be used and helpful. Therapist Response: Pt engaged in discussion. Pt able to identify specific areas of life where Radical Acceptance could be beneficial.        Session Time: 11:00 -12:00   Participation Level: Active   Behavioral Response: CasualAlertDepressed   Type of Therapy: Group Therapy   Treatment Goals addressed: Coping   Interventions: CBT, DBT, Solution Focused, Supportive and Reframing   Summary:  Clinician introduced topic of "Positive Psychology". Group watched "Positive Psychology" Ted-Talk. Patients discussed how their "lens" of life effects the way they feel.  Therapist Response: Pt engaged in discussion. Pt able to identify how changing lens could be beneficial.         Session Time: 12:00 -1:00   Participation Level: Active   Behavioral Response: CasualAlertDepressed   Type of Therapy: Group therapy   Treatment Goals addressed: Coping   Interventions: CBT; Solution focused; Supportive; Reframing   Summary: 12:00 - 12:50: Cln continued topic of "Positive Psychology." Group discussed 5 strategies to help change lens. Patients identified one strategy they  would be willing to try to change their "lens" for at least 21 days to create a new habit. 12:50 -1:00 Clinician led check-out. Clinician assessed for immediate needs, medication  compliance and efficacy, and safety concerns   Therapist Response: 12:00 - 12:50: Pt engaged in discussion. Pt reports wanting to try exercise to change lens. 12:50 - 1:00: At check-out, patient rates her mood at a 7 on a scale of 1-10 with 10 being great. Pt reports afternoon plans of going to work. Patient demonstrates some progress as evidenced by ability to identify ways to combat ruminations, though pt has not employed methods when needed, yet. Patient denies SI/HI/self-harm at the end of group.  Suicidal/Homicidal: Nowithout intent/plan  Plan: Pt will continue in PHP while working to decrease anxiety and depression symptoms, increase ability to manage symptoms in a healthy manner, decrease SI, and decrease negative effect on daily functioning.  Diagnosis: Severe episode of recurrent major depressive disorder, without psychotic features (HCC) [F33.2]    1. Severe episode of recurrent major depressive disorder, without psychotic features [facility])     Quinn Axe, [facility] 11/28/20

## 2020-12-02 NOTE — Psych (Signed)
Virtual Visit via Video Note  I connected with Connie Cook on 11/27/20 at  9:00 AM EST by a video enabled telemedicine application and verified that I am speaking with the correct person using two identifiers.  Location: Patient: Home Provider: Clinical Home Office   I discussed the limitations of evaluation and management by telemedicine and the availability of in person appointments. The patient expressed understanding and agreed to proceed.  Follow Up Instructions:    I discussed the assessment and treatment plan with the patient. The patient was provided an opportunity to ask questions and all were answered. The patient agreed with the plan and demonstrated an understanding of the instructions.   The patient was advised to call back or seek an in-person evaluation if the symptoms worsen or if the condition fails to improve as anticipated.  I provided 240 minutes of non-face-to-face time during this encounter.   Quinn Axe, [facility]    [facility] PHP THERAPIST PROGRESS NOTE  Connie Cook [id number]  Session Time: 9-1  Participation Level: Active  Behavioral Response: CasualAlertAnxious and Depressed  Type of Therapy: Group Therapy  Treatment Goals addressed: Coping  Interventions: CBT, DBT, Solution Focused, Strength-based, Supportive and Reframing  Summary:  Clinician led check-in regarding current stressors and situation, and review of patient completed daily inventory. Clinician utilized active listening and empathetic response and validated patient emotions. Clinician facilitated processing group on pertinent issues.  Therapist Response: Connie Cook is a 60 y.o. female who presents with depression and anxiety symptoms. Patient arrived within time allowed and reports that she is feeling "fine." Patient rates her mood at a 7 on a scale of 1-10 with 10 being great. Pt rates her depression at a 3 on a scale of 1-10, 1 being low and 10 being  high; and her anxiety at a 3, 1 being low and 10 being high. Pt reports she is not enjoying her new role at work and wants to look into other job opportunities. Pt able to process. Pt engaged in discussion.     Session Time: 10:00 -11:00   Participation Level: Active   Behavioral Response: CasualAlertDepressed   Type of Therapy: Group Therapy   Treatment Goals addressed: Coping   Interventions: CBT, DBT, Solution Focused, Supportive and Reframing   Summary:  Clinician introduced topic of the different parts of the mind. Group discussed the different parts of the mind (wise, rational, emotional) and how these parts affect the way we handle feelings and emotions. Clinician led psychoeducation group on distress tolerance. The STOP skill was introduced, and patients discussed how to utilize it. Patients identified when this technique may be helpful in their personal lives.  Therapist Response: Pt engaged in discussion. Pt able to identify different parts of the mind. Pt able to identify when STOP could be helpful.        Session Time: 11:00 -12:00   Participation Level: Active   Behavioral Response: CasualAlertDepressed   Type of Therapy: Group Therapy   Treatment Goals addressed: Coping   Interventions: CBT, DBT, Solution Focused, Supportive and Reframing   Summary:  Clinician continued topic of "Distress Tolerance". Group discussed "TIPS" and how/when patients can employ this method to help.  Patients identified when this technique may be helpful in their personal lives. Clinician discussed "Accepts" skill and how/when patients can employ this method to help.  Patients identified when these techniques may be helpful in their personal lives. Pt's participated in creating Top 5 lists for skills. Therapist  Response: Pt engaged in discussion. Pt able to identify skills that will be useful and participated in creating Top 5 lists.         Session Time: 12:00 -1:00   Participation  Level: Active   Behavioral Response: CasualAlertDepressed   Type of Therapy: Group therapy   Treatment Goals addressed: Coping   Interventions: CBT; Solution focused; Supportive; Reframing   Summary: 12:00 - 12:50: Clinician continued topic of "Distress Tolerance".  Clinician discussed Self-Soothe and how to utilize. Pt's identified ways to incorporate each of the five senses in a relevant way to them.   12:50 -1:00 Clinician led check-out. Clinician assessed for immediate needs, medication compliance and efficacy, and safety concerns   Therapist Response: 12:00 - 12:50: Pt engaged in discussion. Pt able to vision and taste as most helpful senses to use to self-soothe. 12:50 - 1:00: At check-out, patient rates her mood at a 6 on a scale of 1-10 with 10 being great. Pt reports she is not working and has plans on eating with her sister. Patient demonstrates some progress as evidenced by ability to identify cognitive distortions around thinking about current job and future job possibilities. Patient denies SI/HI/self-harm at the end of group.   Suicidal/Homicidal: Nowithout intent/plan  Plan: Pt will continue in PHP while working to decrease anxiety and depression symptoms, increase ability to manage symptoms in a healthy manner, decrease SI, and decrease negative effect on daily functioning.  Diagnosis: Severe episode of recurrent major depressive disorder, without psychotic features (HCC) [F33.2]    1. Severe episode of recurrent major depressive disorder, without psychotic features [facility])     Quinn Axe, [facility] 11/27/20

## 2020-12-02 NOTE — Psych (Signed)
Virtual Visit via Video Note  I connected with Lecretia [name] Guereca on 11/25/20 at  9:00 AM EST by a video enabled telemedicine application and verified that I am speaking with the correct person using two identifiers.  Location: Patient: Home Provider: Clinical Home Office   I discussed the limitations of evaluation and management by telemedicine and the availability of in person appointments. The patient expressed understanding and agreed to proceed.  Follow Up Instructions:    I discussed the assessment and treatment plan with the patient. The patient was provided an opportunity to ask questions and all were answered. The patient agreed with the plan and demonstrated an understanding of the instructions.   The patient was advised to call back or seek an in-person evaluation if the symptoms worsen or if the condition fails to improve as anticipated.  I provided 240 minutes of non-face-to-face time during this encounter.   Quinn Axe, [facility]    [facility] PHP THERAPIST PROGRESS NOTE  Deretha Ertle [id number]  Session Time: 9-1  Participation Level: Active  Behavioral Response: CasualAlertAnxious and Depressed  Type of Therapy: Group Therapy  Treatment Goals addressed: Coping  Interventions: CBT, DBT, Solution Focused, Strength-based, Supportive and Reframing  Summary:  Clinician led check-in regarding current stressors and situation, and review of patient completed daily inventory. Clinician utilized active listening and empathetic response and validated patient emotions. Clinician facilitated processing group on pertinent issues.  Therapist Response: Verda Adiyah Lame is a 60 y.o. female who presents with depression and anxiety symptoms. Patient arrived within time allowed and reports that she is feeling "worried." Patient rates her mood at a 5 on a scale of 1-10 with 10 being great. Pt reports high levels of depression and anxiety today; Pt rates her  depression at a 7 on a scale of 1-10, 1 being low and 10 being high; and her anxiety at an 8, 1 being low and 10 being high. Pt reports she is anxious about moving, finances, and work. Pt reports "I can't stop thinking about it." Pt able to process. Pt engaged in discussion.      Session Time: 10:00 -11:00   Participation Level: Active   Behavioral Response: CasualAlertDepressed   Type of Therapy: Group Therapy   Treatment Goals addressed: Coping   Interventions: CBT, DBT, Solution Focused, Supportive and Reframing   Summary:  Clinician led the group in completing and discussing the needs assessment. Patients identified and discussed areas of life where improvements need to be made. Therapist Response: Pt engaged in discussion. Pt identified health, hobbies, and job skills as some areas that need to be improved.       Session Time: 11:00 -12:00   Participation Level: Active   Behavioral Response: CasualAlertDepressed   Type of Therapy: Group Therapy   Treatment Goals addressed: Coping   Interventions: CBT, DBT, Solution Focused, Supportive and Reframing   Summary:  Clinician continued the group in completing and discussing the needs assessment. Patients identified and discussed areas of life where improvements need to be made. Therapist Response: Pt engaged in discussion. Pt identified work/career, social justice, and dignity as areas that need improvement.        Session Time: 12:00 -1:00   Participation Level: Active   Behavioral Response: CasualAlertDepressed   Type of Therapy: Group therapy   Treatment Goals addressed: Coping   Interventions: CBT; Solution focused; Supportive; Reframing   Summary: 12:00 - 12:50: Clinician led the group in completing and discussing the self-care needs assessment. Patients  identified and discussed areas of self-care where improvements need to be made. 12:50 -1:00 Clinician led check-out. Clinician assessed for immediate needs,  medication compliance and efficacy, and safety concerns   Therapist Response: 12:00 - 12:50: Pt engaged in discussion. Pt identifies psychological and physical self-care as areas that need improvement. 12:50 - 1:00: At check-out, patient rates her mood at an 8 on a scale of 1-10 with 10 being great. Pt reports afternoon plans of going to work. Patient demonstrates some progress as evidenced by willingness to discuss and participate in first day of group. Patient denies SI/HI/self-harm at the end of group.   Suicidal/Homicidal: Nowithout intent/plan  Plan: Pt will continue in PHP while working to decrease anxiety and depression symptoms, increase ability to manage symptoms in a healthy manner, decrease SI, and decrease negative effect on daily functioning.  Diagnosis: Severe episode of recurrent major depressive disorder, without psychotic features (HCC) [F33.2]    1. Severe episode of recurrent major depressive disorder, without psychotic features [facility])     Quinn Axe, [facility] 11/25/20

## 2020-12-02 NOTE — Psych (Signed)
Virtual Visit via Video Note  I connected with Connie Cook on 11/26/20 at  9:00 AM EST by a video enabled telemedicine application and verified that I am speaking with the correct person using two identifiers.  Location: Patient: Home Provider: Clinical Home Office   I discussed the limitations of evaluation and management by telemedicine and the availability of in person appointments. The patient expressed understanding and agreed to proceed.  Follow Up Instructions:    I discussed the assessment and treatment plan with the patient. The patient was provided an opportunity to ask questions and all were answered. The patient agreed with the plan and demonstrated an understanding of the instructions.   The patient was advised to call back or seek an in-person evaluation if the symptoms worsen or if the condition fails to improve as anticipated.  I provided 240 minutes of non-face-to-face time during this encounter.   Quinn Axe, [facility]    [facility] PHP THERAPIST PROGRESS NOTE  Connie Cook [id number]  Session Time: 9-1  Participation Level: Active  Behavioral Response: CasualAlertAnxious and Depressed  Type of Therapy: Group Therapy  Treatment Goals addressed: Coping  Interventions: CBT, DBT, Solution Focused, Strength-based, Supportive and Reframing  Summary:  Clinician led check-in regarding current stressors and situation, and review of patient completed daily inventory. Clinician utilized active listening and empathetic response and validated patient emotions. Clinician facilitated processing group on pertinent issues.  Therapist Response: Connie Cook is a 60 y.o. female who presents with depression and anxiety symptoms.Patient arrived within time allowed and reports that she is feeling "good." Patient rates her mood at a 7 on a scale of 1-10 with 10 being great. Pt rates her depression at a 6 on a scale of 1-10, 1 being low and 10 being  high; and her anxiety at a 6, 1 being low and 10 being high. Pt reports she had a good afternoon. 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 Pt is unsure how she feels about this new role. Pt able to process. Pt engaged in discussion.     Session Time: 10:00 -11:00   Participation Level: Active   Behavioral Response: CasualAlertDepressed   Type of Therapy: Group Therapy   Treatment Goals addressed: Coping   Interventions: CBT, DBT, Solution Focused, Supportive and Reframing   Summary:  Clinician introduced topic of "Cognitive Distortions". Patients identified cognitive distortions they often have and ways to combat these distortions. Therapist Response: Pt engaged in discussion. Pt able to identify cognitive distortions she often engages in.       Session Time: 11:00 -12:00   Participation Level: Active   Behavioral Response: CasualAlertDepressed   Type of Therapy: Group Therapy   Treatment Goals addressed: Coping   Interventions:  Supportive and Reframing   Summary:  Spiritual care group.  Therapist Response: Pt engaged in discussion. See Chaplin note.        Session Time: 12:00 -1:00   Participation Level: Active   Behavioral Response: CasualAlertDepressed   Type of Therapy: Group therapy   Treatment Goals addressed: Coping   Interventions: CBT; Solution focused; Supportive; Reframing   Summary: 12:00 - 12:50: Clinician continued topic of "Cognitive Distortions." Pts continued to identify common cognitive distortions they often experience and ways to combat those distortions. Cln educated group on skills of checking the facts, feelings don't equal fact, and best friend test.  12:50 -1:00 Clinician led check-out. Clinician assessed for immediate needs, medication compliance and efficacy, and  safety concerns   Therapist Response: 12:00 - 12:50: Pt engaged in discussion. Pt able to identify cognitive distortions and how to  combat. 12:50 - 1:00: At check-out, patient rates her mood at an 6 on a scale of 1-10 with 10 being great. Pt reports her back is hurting so she feels "a little off." Pt shares afternoon plans of going to work. Patient demonstrates some progress as evidenced by ability to provide examples of cognitive distortions in own life and identify how to combat in the future. Patient denies SI/HI/self-harm at the end of group.   Suicidal/Homicidal: Nowithout intent/plan  Plan: Pt will continue in PHP while working to decrease anxiety and depression symptoms, increase ability to manage symptoms in a healthy manner, decrease SI, and decrease negative effect on daily functioning.  Diagnosis: Severe episode of recurrent major depressive disorder, without psychotic features (HCC) [F33.2]    1. Severe episode of recurrent major depressive disorder, without psychotic features [facility])     Quinn Axe, [facility] 11/26/20

## 2020-12-02 NOTE — Psych (Signed)
Virtual Visit via Video Note  I connected with Connie Cook on 11/29/20 at  9:00 AM EST by a video enabled telemedicine application and verified that I am speaking with the correct person using two identifiers.  Location: Patient: Home Provider: Clinical Home Office   I discussed the limitations of evaluation and management by telemedicine and the availability of in person appointments. The patient expressed understanding and agreed to proceed.  Follow Up Instructions:    I discussed the assessment and treatment plan with the patient. The patient was provided an opportunity to ask questions and all were answered. The patient agreed with the plan and demonstrated an understanding of the instructions.   The patient was advised to call back or seek an in-person evaluation if the symptoms worsen or if the condition fails to improve as anticipated.  I provided 240 minutes of non-face-to-face time during this encounter.   Connie Cook, [facility]    [facility] PHP THERAPIST PROGRESS NOTE  Connie Cook [id number]  Session Time: 9-1  Participation Level: Active  Behavioral Response: CasualAlertAnxious and Depressed  Type of Therapy: Group Therapy  Treatment Goals addressed: Coping  Interventions: CBT, DBT, Solution Focused, Strength-based, Supportive and Reframing  Summary:  Clinician led check-in regarding current stressors and situation, and review of patient completed daily inventory. Clinician utilized active listening and empathetic response and validated patient emotions. Clinician facilitated processing group on pertinent issues.  Therapist Response: Connie Cook is a 60 y.o. female who presents with depression and anxiety symptoms. Patient arrived within time allowed and reports that she is feeling "ok." Patient rates her mood at a 4 on a scale of 1-10 with 10 being great. Pt rates her depression at a 4 on a scale of 1-10, 1 being low and 10 being  high; and her anxiety at a 3, 1 being low and 10 being high. Pt reports she had a panic attack yesterday afternoon and did not go to work. Pt reports she did not use any skills to manage symptoms. Pt reports triggers included job and financial anxiety. Pt able to process. Pt engaged in discussion.     Session Time: 10:00 -11:00   Participation Level: Active   Behavioral Response: CasualAlertDepressed   Type of Therapy: Group Therapy   Treatment Goals addressed: Coping   Interventions: CBT, DBT, Solution Focused, Supportive and Reframing   Summary:  Clinician introduced topic of "Grounding". Patients discussed how to use techniques for grounding during emotionally charged times or when feeling dissociated.  Patients practiced some ground techniques, including 5-4-3-2-1 and bilateral tapping.  Therapist Response: Pt engaged in discussion. Pt able to identify when grounding techniques could be helpful.        Session Time: 11:00 -12:00   Participation Level: Active   Behavioral Response: CasualAlertDepressed   Type of Therapy: Group Therapy   Treatment Goals addressed: Coping   Interventions: CBT, DBT, Solution Focused, Supportive and Reframing   Summary:  Clinician introduced "Mindfulness". Clinician showed Connie Cook on mindfulness and the group discussed.  Therapist Response: Pt engaged in discussion. Pt able to identify how mindfulness would be helpful in every day life.         Session Time: 12:00 -1:00   Participation Level: Minimal   Behavioral Response: CasualAlertDepressed   Type of Therapy: Group therapy   Treatment Goals addressed: Coping   Interventions: CBT; Solution focused; Supportive; Reframing   Summary: 12:00 - 12:50: Clinician continued "Mindfulness". Group discussed "What" and "How" skills of mindfulness.  Group discussed activities pts can use to practice mindfulness. 12:50 -1:00 Clinician led check-out. Clinician assessed for immediate needs,  medication compliance and efficacy, and safety concerns   Therapist Response: 12:00 - 12:50: Pt did not engage in discussion. Pt was absent from group for 30 minutes. Pt reports she felt like she needed to take a moment for herself. Pt denies SI/HI/AVH.  12:50 - 1:00: At check-out, patient rates her mood at a 4 on a scale of 1-10 with 10 being great. Pt reports afternoon plans of going to work. Patient demonstrates minimal progress as evidenced by not using skills learned to help manage symptoms outside of group. Patient denies SI/HI/self-harm at the end of group.   Suicidal/Homicidal: Nowithout intent/plan  Plan: Pt will continue in PHP while working to decrease anxiety and depression symptoms, increase ability to manage symptoms in a healthy manner, decrease SI, and decrease negative effect on daily functioning.  Diagnosis: Severe episode of recurrent major depressive disorder, without psychotic features (HCC) [F33.2]    1. Severe episode of recurrent major depressive disorder, without psychotic features [facility])     Connie Cook, [facility] 11/29/20

## 2020-12-03 ENCOUNTER — Ambulatory Visit (INDEPENDENT_AMBULATORY_CARE_PROVIDER_SITE_OTHER): Payer: No Payment, Other | Admitting: Professional

## 2020-12-03 ENCOUNTER — Encounter (HOSPITAL_COMMUNITY): Payer: Self-pay | Admitting: Professional

## 2020-12-03 ENCOUNTER — Other Ambulatory Visit: Payer: Self-pay

## 2020-12-03 ENCOUNTER — Other Ambulatory Visit (HOSPITAL_COMMUNITY): Payer: Self-pay | Admitting: Family

## 2020-12-03 DIAGNOSIS — F332 Major depressive disorder, recurrent severe without psychotic features: Secondary | ICD-10-CM | POA: Diagnosis not present

## 2020-12-03 MED ORDER — ARIPIPRAZOLE 5 MG PO TABS: 5.0000 mg | ORAL_TABLET | Freq: Every day | ORAL | 0 refills | Status: DC

## 2020-12-03 MED ORDER — ARIPIPRAZOLE 5 MG PO TABS: 5.0000 mg | ORAL_TABLET | Freq: Every day | ORAL | 2 refills | Status: DC

## 2020-12-03 NOTE — Psych (Signed)
Virtual Visit via Video Note  I connected with Connie Cook on 12/03/20 at  9:00 AM EST by a video enabled telemedicine application and verified that I am speaking with the correct person using two identifiers.  Location: Patient: Home Provider: Clinical Home Office   I discussed the limitations of evaluation and management by telemedicine and the availability of in person appointments. The patient expressed understanding and agreed to proceed.  Follow Up Instructions:    I discussed the assessment and treatment plan with the patient. The patient was provided an opportunity to ask questions and all were answered. The patient agreed with the plan and demonstrated an understanding of the instructions.   The patient was advised to call back or seek an in-person evaluation if the symptoms worsen or if the condition fails to improve as anticipated.  I provided 240 minutes of non-face-to-face time during this encounter.   Connie Cook, [facility]    [facility] PHP THERAPIST PROGRESS NOTE  Connie Cook [id number]  Session Time: 9-1  Participation Level: Active  Behavioral Response: CasualAlertAnxious and Depressed  Type of Therapy: Group Therapy  Treatment Goals addressed: Coping  Interventions: CBT, DBT, Solution Focused, Strength-based, Supportive and Reframing  Summary:  Clinician led check-in regarding current stressors and situation, and review of patient completed daily inventory. Clinician utilized active listening and empathetic response and validated patient emotions. Clinician facilitated processing group on pertinent issues.  Therapist Response: Connie Cook is a 60 y.o. female who presents with depression and anxiety symptoms. Patient arrived within time allowed and reports that she is feeling "not too good." Patient rates her mood at a 3 on a scale of 1-10 with 10 being great. Pt rates her depression at a 8 on a scale of 1-10, 1 being low and  10 being high; and his anxiety at a 8, 1 being low and 10 being high. Pt reports having thoughts of suicide last night and thought about taking pills. Pt reports she did not act on thoughts. Pt states she was triggered by thinking about her job and how miserable she is. Pt reports she tried to distract self with looking at other jobs on [employer] and looking at [employer]. Pt reports she took her sleeping meds to go to sleep. Pt states she is tired of feeling "this way." Pt able to process. Pt engaged in discussion.     Session Time: 10:00 -11:00   Participation Level: Active   Behavioral Response: CasualAlertDepressed   Type of Therapy: Group Therapy   Treatment Goals addressed: Coping   Interventions: CBT, DBT, Solution Focused, Supportive and Reframing   Summary:  Clinician introduced topic of "Boundaries". Patients discussed the difference between rigid, porous, and healthy boundaries.  Patients identified different areas in life where boundaries need to be addressed. Therapist Response: Pt engaged in discussion. Pt reports she has porous and rigid boundaries and would like to aim for healthy.        Session Time: 11:00 -12:00   Participation Level: Active   Behavioral Response: CasualAlertDepressed   Type of Therapy: Group Therapy   Treatment Goals addressed: Coping   Interventions: Spiritual Care Group   Summary:  See Chaplin note   Therapist Response: Pt engaged in discussion.          Session Time: 12:00 -1:00   Participation Level: Active   Behavioral Response: CasualAlertDepressed   Type of Therapy: Group therapy   Treatment Goals addressed: Coping   Interventions: CBT, DBT, Solution Focused,  Supportive and Reframing   Summary: 12:00 - 12:50: Clinician continued topic of "Boundaries". Patients identified types of boundaries, including emotional, physical, intellectual, material, and time. Patients identified which type of boundaries may need more examination  and change in own life. 12:50 -1:00 Clinician led check-out. Clinician assessed for immediate needs, medication compliance and efficacy, and safety concerns   Therapist Response: 12:00 - 12:50: Pt engaged in discussion. Pt was able to identify personal boundaries and identify specific areas in life where boundaries could be beneficial.  12:50 - 1:00: At check-out, patient rates her mood at a 4 on a scale of 1-10 with 10 being great. Pt reports afternoon plans of going to work and pick up meds. Cln spent extra time safety planning with pt due to SI last night. Pt reports she will try coping skills like 5-4-3-2-1 or distractions on [employer], call sister, or contact the Crisis Hotline number. Pt again denies current SI/HI/AVH. Patient demonstrates some progress as evidenced by attempting to use skills to help self during SI. Patient denies SI/HI/self-harm at the end of group.   Suicidal/Homicidal: Nowithout intent/plan  Plan: Pt will continue in PHP while working to decrease anxiety and depression symptoms, increase ability to manage symptoms in a healthy manner, decrease SI, and decrease negative effect on daily functioning.  Diagnosis: Severe episode of recurrent major depressive disorder, without psychotic features (HCC) [F33.2]    1. Severe episode of recurrent major depressive disorder, without psychotic features [facility])     Connie Cook, [facility] 12/03/20

## 2020-12-03 NOTE — Progress Notes (Signed)
Virtual Visit via Video Note  I connected with Claudett [name] Camps on 12/03/20 at  9:00 AM EST by a video enabled telemedicine application and verified that I am speaking with the correct person using two identifiers.  Location: Patient: home Provider: office   I discussed the limitations of evaluation and management by telemedicine and the availability of in person appointments. The patient expressed understanding and agreed to proceed.  I discussed the assessment and treatment plan with the patient. The patient was provided an opportunity to ask questions and all were answered. The patient agreed with the plan and demonstrated an understanding of the instructions.   The patient was advised to call back or seek an in-person evaluation if the symptoms worsen or if the condition fails to improve as anticipated.  I provided 15 minutes of non-face-to-face time during this encounter.   Oneta Rack, NP   [facility] MD/PA/NP OP Progress Note  12/03/2020 9:30 AM Iona Cylinda Santoli  MRN:  [id number]  Chancey via telemetry assessment.  She presents flat, guarded and depressed.  Citing suicidal ideations and decline in mood on last night.  Reports chronic ruminations " about my past and future."  Reports her work is currently her main stressor where she feels is too fast-paced.  Currently she is denying suicidal or homicidal ideations.  Denies auditory or visual hallucinations.  Rates her depression 8 out of 10 with 10 being the worst.  Reports she is prescribed Zoloft 200 mg daily however states she does not feel medications is helpful.  Discussed initiating Abilify 5 mg p.o. daily as adjunct therapy.  Patient was receptive to plan.  NP spoke to pharmacist Verna Czech medication samples.  Patient to follow-up at her outpatient [facility] clinic for medications.  Staff to continue to monitor for safety.  Discussed crisis hotline information and/or follow-up at local emergency department for safety.   Delora reports a good appetite.  States she is resting well throughout the night.  Support, encouragement and reassurance was provided.   Visit Diagnosis: No diagnosis found.  Past Psychiatric History:   Past Medical History:  Past Medical History:  Diagnosis Date  . Anxiety   . Arthritis   . DDD (degenerative disc disease), cervical   . Depression   . H/O adenoidectomy   . History of removal of ovarian cyst   . Hyperlipidemia     Past Surgical History:  Procedure Laterality Date  . APPENDECTOMY      Family Psychiatric History:   Family History:  Family History  Problem Relation Age of Onset  . Alcohol abuse Father   . Alcohol abuse Maternal Uncle   . Alcohol abuse Paternal Uncle     Social History:  Social History   Socioeconomic History  . Marital status: Single    Spouse name: Not on file  . Number of children: 0  . Years of education: Not on file  . Highest education level: Not on file  Occupational History  . Not on file  Tobacco Use  . Smoking status: Former Games developer  . Smokeless tobacco: Former Clinical biochemist  . Vaping Use: Never used  Substance and Sexual Activity  . Alcohol use: Never  . Drug use: Never  . Sexual activity: Not on file  Other Topics Concern  . Not on file  Social History Narrative  . Not on file   Social Determinants of Health   Financial Resource Strain: Not on file  Food Insecurity: Not on file  Transportation Needs: Not on file  Physical Activity: Not on file  Stress: Not on file  Social Connections: Not on file    Allergies:  Allergies  Allergen Reactions  . Codeine Shortness Of Breath  . Penicillins Rash    Metabolic Disorder Labs: No results found for: HGBA1C, MPG No results found for: PROLACTIN No results found for: CHOL, TRIG, HDL, CHOLHDL, VLDL, LDLCALC No results found for: TSH  Therapeutic Level Labs: No results found for: LITHIUM No results found for: VALPROATE No components found for:   CBMZ  Current Medications: Current Outpatient Medications  Medication Sig Dispense Refill  . ARIPiprazole (ABILIFY) 5 MG tablet Take 1 tablet (5 mg total) by mouth daily. 30 tablet 2  . cephALEXin (KEFLEX) 500 MG capsule Take 500 mg by mouth 4 (four) times daily. (Patient not taking: Reported on 11/29/2020)    . clindamycin (CLEOCIN) 150 MG capsule Take by mouth 3 (three) times daily. (Patient not taking: Reported on 11/29/2020)    . cyclobenzaprine (FLEXERIL) 5 MG tablet Take 5 mg by mouth 3 (three) times daily as needed for muscle spasms.    Marland Kitchen HYDROcodone-acetaminophen (NORCO) 10-325 MG per tablet Take 1 tablet by mouth every 6 (six) hours as needed. (Patient not taking: Reported on 11/29/2020)    . LORazepam (ATIVAN) 1 MG tablet Take 1 mg by mouth daily.    . Probiotic Product (PROBIOTIC DAILY PO) Take by mouth. (Patient not taking: Reported on 11/29/2020)    . rosuvastatin (CRESTOR) 20 MG tablet Take 20 mg by mouth daily. (Patient not taking: Reported on 11/29/2020)    . sertraline (ZOLOFT) 100 MG tablet Take 200 mg by mouth daily.     No current facility-administered medications for this visit.     Musculoskeletal: Strength & Muscle Tone: within normal limits Gait & Station: normal Patient leans: N/A  Psychiatric Specialty Exam: Review of Systems  There were no vitals taken for this visit.There is no height or weight on file to calculate BMI.  General Appearance: Casual  Eye Contact:  Good  Speech:  Clear and Coherent  Volume:  Normal  Mood:  Depressed  Affect:  Depressed and Flat  Thought Process:  Coherent  Orientation:  Full (Time, Place, and Person)  Thought Content: Rumination   Suicidal Thoughts:  Yes.  without intent/plan  Homicidal Thoughts:  No  Memory:  Immediate;   Fair Recent;   Fair  Judgement:  Fair  Insight:  Fair  Psychomotor Activity:  Normal  Concentration:  Concentration: Fair  Recall:  [employer] of Knowledge: Fair  Language: Good  Akathisia:  No   Handed:  Left  AIMS (if indicated): done  Assets:  Communication Skills Desire for Improvement Resilience Social Support  ADL's:  Intact  Cognition: WNL  Sleep:  Fair   Screenings: GAD-7   Advertising copywriter from 11/25/2020 in [facility]  Total GAD-7 Score 11    PHQ2-9   Flowsheet Row Counselor from 11/29/2020 in [facility] Counselor from 11/25/2020 in [facility]  PHQ-2 Total Score 6 6  PHQ-9 Total Score 22 20       Assessment and Plan:  Continue partial hospitalization programming at [facility] urgent care facility -Continue Zoloft 200 mg p.o. daily -Start Abilify 5 mg p.o. daily  Treatment plan was reviewed and agreed upon by NP T. Melvyn Neth and patient Momoko Slezak  need for continued group services  Oneta Rack, NP 12/03/2020,  9:30 AM

## 2020-12-04 ENCOUNTER — Ambulatory Visit (INDEPENDENT_AMBULATORY_CARE_PROVIDER_SITE_OTHER): Payer: No Payment, Other | Admitting: Professional

## 2020-12-04 ENCOUNTER — Other Ambulatory Visit: Payer: Self-pay

## 2020-12-04 DIAGNOSIS — F332 Major depressive disorder, recurrent severe without psychotic features: Secondary | ICD-10-CM

## 2020-12-04 NOTE — Progress Notes (Signed)
Spoke with patient via Webex video call, used 2 identifiers to correctly identify patient. States that groups are going well. She worked over the weekend but was let go yesterday after being honest about her depression. They told her they are unable to work with her in having days off. She was not upset about losing her job as she was not satisfied there. Sees it as another door will open. Has not yet started Abilify but hopes it will help with her anxiety and depression. She plans to start it tomorrow. On scale 1-10 as 10 being worst she rates depression at 7 and anxiety at 7. Denies SI/HI or AV hallucinations. PHQ9=23. No issues or complaints.

## 2020-12-05 ENCOUNTER — Ambulatory Visit (HOSPITAL_COMMUNITY): Payer: No Payment, Other

## 2020-12-05 ENCOUNTER — Telehealth (HOSPITAL_COMMUNITY): Payer: Self-pay | Admitting: Professional

## 2020-12-06 ENCOUNTER — Ambulatory Visit (INDEPENDENT_AMBULATORY_CARE_PROVIDER_SITE_OTHER): Payer: No Payment, Other | Admitting: Professional

## 2020-12-06 DIAGNOSIS — F332 Major depressive disorder, recurrent severe without psychotic features: Secondary | ICD-10-CM | POA: Diagnosis not present

## 2020-12-09 ENCOUNTER — Ambulatory Visit (HOSPITAL_COMMUNITY): Payer: No Payment, Other | Admitting: Licensed Clinical Social Worker

## 2020-12-09 DIAGNOSIS — F332 Major depressive disorder, recurrent severe without psychotic features: Secondary | ICD-10-CM

## 2020-12-09 NOTE — Psych (Signed)
Virtual Visit via Video Note  I connected with Connie Cook on 12/04/20 at  9:00 AM EST by a video enabled telemedicine application and verified that I am speaking with the correct person using two identifiers.  Location: Patient: Home Provider: Clinical Home Office   I discussed the limitations of evaluation and management by telemedicine and the availability of in person appointments. The patient expressed understanding and agreed to proceed.  Follow Up Instructions:    I discussed the assessment and treatment plan with the patient. The patient was provided an opportunity to ask questions and all were answered. The patient agreed with the plan and demonstrated an understanding of the instructions.   The patient was advised to call back or seek an in-person evaluation if the symptoms worsen or if the condition fails to improve as anticipated.  I provided 240 minutes of non-face-to-face time during this encounter.   Quinn Axe, [facility]    [facility] PHP THERAPIST PROGRESS NOTE  Connie Cook [id number]  Session Time: 9-1  Participation Level: Active  Behavioral Response: CasualAlertAnxious and Depressed  Type of Therapy: Group Therapy  Treatment Goals addressed: Coping  Interventions: CBT, DBT, Solution Focused, Strength-based, Supportive and Reframing  Summary:  Clinician led check-in regarding current stressors and situation, and review of patient completed daily inventory. Clinician utilized active listening and empathetic response and validated patient emotions. Clinician facilitated processing group on pertinent issues.  Therapist Response: Connie Cook is a 60 y.o. female who presents with depression and anxiety symptoms. Patient arrived within time allowed and reports that she is feeling "not too good." Patient rates her mood at a 5 on a scale of 1-10 with 10 being great. Pt rates her depression at a 5 on a scale of 1-10, 1 being low and  10 being high; and his anxiety at a 5, 1 being low and 10 being high. Pt reports her job "let me go" yesterday. Pt reports being at peace with the decision because she was not happy in the job. Pt states "this opens a door for me to find something new." Pt looked into classes at [employer] [facility] to help further education and job opportunities. Pt able to process. Pt engaged in discussion.     Session Time: 10:00 -11:00   Participation Level: Active   Behavioral Response: CasualAlertDepressed   Type of Therapy: Group Therapy   Treatment Goals addressed: Coping   Interventions: CBT, DBT, Solution Focused, Supportive and Reframing   Summary:  Clinician introduced topic of "Values". Patients participated in a fallout shelter activity that helped them identify their values.  Therapist Response: Pt engaged in discussion. Pt reports family and education are important values.        Session Time: 11:00 -12:00   Participation Level: Active   Behavioral Response: CasualAlertDepressed   Type of Therapy: Group Therapy   Treatment Goals addressed: Coping   Interventions: CBT, DBT, Solution Focused, Supportive and Reframing   Summary:  Clinician continued topic of "Values". Group discussed why values are important and how to find out what values are most important to self. Group completed a values worksheet to help.   Therapist Response: Pt engaged in discussion. Pt identified service to others, friendships, and spiritual care as most important values.          Session Time: 12:00 -1:00   Participation Level: Active   Behavioral Response: CasualAlertDepressed   Type of Therapy: Group therapy   Treatment Goals addressed: Coping   Interventions: CBT,  DBT, Solution Focused, Supportive and Reframing   Summary: 12:00 - 12:50: Group discussed SMART goals and completed the steps to create own SMART goal during group. 12:50 -1:00 Clinician led check-out. Clinician assessed for immediate needs,  medication compliance and efficacy, and safety concerns   Therapist Response: 12:00 - 12:50: Pt engaged in discussion. Pt was able to identify why SMART goals could be beneficial to setting goals. Pt set goal of becoming organized.  12:50 - 1:00: At check-out, patient rates her mood at a 7 on a scale of 1-10 with 10 being great. Pt reports afternoon plans of making calls and food shopping. Patient demonstrates some progress as evidenced by ability to reframe tough situations. Patient denies SI/HI/self-harm at the end of group.   Suicidal/Homicidal: Nowithout intent/plan  Plan: Pt will continue in PHP while working to decrease anxiety and depression symptoms, increase ability to manage symptoms in a healthy manner, decrease SI, and decrease negative effect on daily functioning.  Diagnosis: Severe episode of recurrent major depressive disorder, without psychotic features (HCC) [F33.2]    1. Severe episode of recurrent major depressive disorder, without psychotic features [facility])     Quinn Axe, [facility] 12/04/20

## 2020-12-10 ENCOUNTER — Encounter (HOSPITAL_COMMUNITY): Payer: Self-pay

## 2020-12-10 ENCOUNTER — Other Ambulatory Visit: Payer: Self-pay

## 2020-12-10 NOTE — Psych (Signed)
Virtual Visit via Video Note  I connected with Connie Cook on 12/06/20 at  9:00 AM EST by a video enabled telemedicine application and verified that I am speaking with the correct person using two identifiers.  Location: Patient: Home Provider: Clinical Home Office   I discussed the limitations of evaluation and management by telemedicine and the availability of in person appointments. The patient expressed understanding and agreed to proceed.  Follow Up Instructions:    I discussed the assessment and treatment plan with the patient. The patient was provided an opportunity to ask questions and all were answered. The patient agreed with the plan and demonstrated an understanding of the instructions.   The patient was advised to call back or seek an in-person evaluation if the symptoms worsen or if the condition fails to improve as anticipated.  I provided 240 minutes of non-face-to-face time during this encounter.   Quinn Axe, [facility]    [facility] PHP THERAPIST PROGRESS NOTE  Connie Cook [id number]  Session Time: 9-1  Participation Level: Active  Behavioral Response: CasualAlertAnxious and Depressed  Type of Therapy: Group Therapy  Treatment Goals addressed: Coping  Interventions: CBT, DBT, Solution Focused, Strength-based, Supportive and Reframing  Summary:  Clinician led check-in regarding current stressors and situation, and review of patient completed daily inventory. Clinician utilized active listening and empathetic response and validated patient emotions. Clinician facilitated processing group on pertinent issues.  Therapist Response: Rendi Marnie Fazzino is a 60 y.o. female who presents with depression and anxiety symptoms. Patient arrived within time allowed and reports that she is feeling "not too good." Patient rates her mood at a 5 on a scale of 1-10 with 10 being great. Pt rates her depression at a 5 on a scale of 1-10, 1 being low and  10 being high; and his anxiety at a 5, 1 being low and 10 being high. Pt reports she feels better than yesterday (she had a migraine and stomach issues). Pt shares she is moving in with her sister while she gets her life "sorted out." Pt reports she had anxiety yesterday and was able to manage by talking to her sister. Pt able to process. Pt engaged in discussion.     Session Time: 10:00 -11:00   Participation Level: Active   Behavioral Response: CasualAlertDepressed   Type of Therapy: Group Therapy   Treatment Goals addressed: Coping   Interventions: CBT, DBT, Solution Focused, Supportive and Reframing   Summary:  Clinician continued topic of "Communication". Patients discussed the importance of non-verbal communication and different aspects. Pts watched a Felicity Pellegrini Talk on non-verbal communication and discussed. Therapist Response: Pt engaged in discussion. Pt able to identify aspects of non-verbal communication and why it is important.       Session Time: 11:00 -12:00   Participation Level: Active   Behavioral Response: CasualAlertDepressed   Type of Therapy: Group Therapy   Treatment Goals addressed: Coping   Interventions: CBT, DBT, Solution Focused, Supportive and Reframing   Summary:  Clinician introduced topic of "fair fighting rules".  Patients identified which rules they already follow, and which rules they need to be more aware of for better communication in the future.  Therapist Response: Pt engaged in discussion. Pt identified rules that could help with assertive communication in the future.          Session Time: 12:00 -1:00   Participation Level: Active   Behavioral Response: CasualAlertDepressed   Type of Therapy: Group therapy   Treatment Goals  addressed: Coping   Interventions: CBT, DBT, Solution Focused, Supportive and Reframing   Summary: 12:00 - 12:50: Cln introduced active listening. Group learned tenets of active listening and its purpose. Group  discussed basic communication errors we typically fall into and how to resist making them.   12:50 -1:00 Clinician led check-out. Clinician assessed for immediate needs, medication compliance and efficacy, and safety concerns   Therapist Response: 12:00 - 12:50: Pt engaged in discussion. Pt was able to identify why active listening is important. Pt was able to practice active listening skills and identify in clips.  12:50 - 1:00: At check-out, patient rates her mood at a 7 on a scale of 1-10 with 10 being great. Pt reports weekend plans of making calls and getting boxes for her move. Patient demonstrates some progress as evidenced by ability to use support network. Patient denies SI/HI/self-harm at the end of group.   Suicidal/Homicidal: Nowithout intent/plan  Plan: Pt will continue in PHP while working to decrease anxiety and depression symptoms, increase ability to manage symptoms in a healthy manner, decrease SI, and decrease negative effect on daily functioning.  Diagnosis: Severe episode of recurrent major depressive disorder, without psychotic features (HCC) [F33.2]    1. Severe episode of recurrent major depressive disorder, without psychotic features [facility])     Quinn Axe, [facility] 12/06/20
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
## 2020-12-17 ENCOUNTER — Other Ambulatory Visit (HOSPITAL_COMMUNITY): Payer: Self-pay | Admitting: Family

## 2020-12-17 ENCOUNTER — Telehealth (HOSPITAL_COMMUNITY): Payer: Self-pay | Admitting: *Deleted

## 2020-12-17 MED ORDER — ARIPIPRAZOLE 5 MG PO TABS: 5.0000 mg | ORAL_TABLET | Freq: Every day | ORAL | 2 refills | Status: DC

## 2020-12-17 MED FILL — ARIPiprazole 5 MG TABS: 5 | 30 days supply | Qty: 30 | Fill #0

## 2020-12-17 NOTE — Addendum Note (Signed)
Addended by: Oneta Rack on: 12/17/2020 08:19 PM   Modules accepted: Orders

## 2020-12-17 NOTE — Progress Notes (Signed)
Refilled Prdx and was sent to [facility]

## 2020-12-17 NOTE — Telephone Encounter (Signed)
Call from patient to check on a way her Abilify could be more affordable. She is currently being seen in intensive outpatient by Chilton Greathouse NP but in late Feb will be seen here by Aldean Ast DMP. Suggested to her she use community [facility] pharmacy and explained the cost structure. She is open to this change. Sent a message to Alcario Drought to change her Rx location to community pharmacy.

## 2021-01-06 ENCOUNTER — Telehealth (INDEPENDENT_AMBULATORY_CARE_PROVIDER_SITE_OTHER): Payer: No Payment, Other | Admitting: Psychiatry

## 2021-01-06 ENCOUNTER — Encounter (HOSPITAL_COMMUNITY): Payer: Self-pay | Admitting: Psychiatry

## 2021-01-06 ENCOUNTER — Other Ambulatory Visit: Payer: Self-pay

## 2021-01-06 ENCOUNTER — Other Ambulatory Visit (HOSPITAL_COMMUNITY): Payer: Self-pay | Admitting: Psychiatry

## 2021-01-06 DIAGNOSIS — F411 Generalized anxiety disorder: Secondary | ICD-10-CM

## 2021-01-06 DIAGNOSIS — F332 Major depressive disorder, recurrent severe without psychotic features: Secondary | ICD-10-CM

## 2021-01-06 DIAGNOSIS — F4310 Post-traumatic stress disorder, unspecified: Secondary | ICD-10-CM

## 2021-01-06 MED ORDER — TRAZODONE HCL 50 MG PO TABS: 50.0000 mg | ORAL_TABLET | Freq: Every evening | ORAL | 2 refills | Status: DC | PRN

## 2021-01-06 MED ORDER — ARIPIPRAZOLE 10 MG PO TABS: 10.0000 mg | ORAL_TABLET | Freq: Every day | ORAL | 2 refills | Status: DC

## 2021-01-06 MED ORDER — HYDROXYZINE HCL 10 MG PO TABS: 10.0000 mg | ORAL_TABLET | Freq: Three times a day (TID) | ORAL | 2 refills | Status: DC | PRN

## 2021-01-06 MED ORDER — SERTRALINE HCL 100 MG PO TABS: 200.0000 mg | ORAL_TABLET | Freq: Every day | ORAL | 2 refills | Status: DC

## 2021-01-06 MED FILL — hydrOXYzine HCL 10 MG TABS: 10 | 30 days supply | Qty: 90 | Fill #0

## 2021-01-06 MED FILL — traZODone HCL 50 MG TABS: 50 | 30 days supply | Qty: 30 | Fill #0

## 2021-01-06 MED FILL — ARIPiprazole 10 MG TABS: 10 | 30 days supply | Qty: 30 | Fill #0

## 2021-01-06 MED FILL — SERTRALINE HCL 100 MG TAB: 100 | 30 days supply | Qty: 60 | Fill #0
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
## 2021-01-23 ENCOUNTER — Other Ambulatory Visit: Payer: Self-pay

## 2021-01-23 ENCOUNTER — Ambulatory Visit (HOSPITAL_COMMUNITY): Payer: No Payment, Other | Admitting: Licensed Clinical Social Worker

## 2021-02-04 ENCOUNTER — Other Ambulatory Visit: Payer: Self-pay

## 2021-02-04 ENCOUNTER — Ambulatory Visit (INDEPENDENT_AMBULATORY_CARE_PROVIDER_SITE_OTHER): Payer: No Payment, Other | Admitting: Licensed Clinical Social Worker

## 2021-02-04 DIAGNOSIS — F4310 Post-traumatic stress disorder, unspecified: Secondary | ICD-10-CM
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
## 2021-03-25 ENCOUNTER — Other Ambulatory Visit: Payer: Self-pay

## 2021-03-25 ENCOUNTER — Ambulatory Visit (INDEPENDENT_AMBULATORY_CARE_PROVIDER_SITE_OTHER): Payer: No Payment, Other | Admitting: Licensed Clinical Social Worker

## 2021-03-25 DIAGNOSIS — F4310 Post-traumatic stress disorder, unspecified: Secondary | ICD-10-CM

## 2021-03-25 MED FILL — Aripiprazole Tab 10 MG: ORAL | 30 days supply | Qty: 30 | Fill #0 | Status: AC

## 2021-03-25 MED FILL — Trazodone HCl Tab 50 MG: ORAL | 30 days supply | Qty: 30 | Fill #0 | Status: AC

## 2021-03-27 ENCOUNTER — Other Ambulatory Visit: Payer: Self-pay
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
## 2021-04-01 ENCOUNTER — Telehealth (INDEPENDENT_AMBULATORY_CARE_PROVIDER_SITE_OTHER): Payer: No Payment, Other | Admitting: Psychiatry

## 2021-04-01 ENCOUNTER — Encounter (HOSPITAL_COMMUNITY): Payer: Self-pay | Admitting: Psychiatry

## 2021-04-01 ENCOUNTER — Other Ambulatory Visit: Payer: Self-pay

## 2021-04-01 DIAGNOSIS — F411 Generalized anxiety disorder: Secondary | ICD-10-CM | POA: Diagnosis not present

## 2021-04-01 DIAGNOSIS — F4310 Post-traumatic stress disorder, unspecified: Secondary | ICD-10-CM | POA: Diagnosis not present

## 2021-04-01 DIAGNOSIS — F332 Major depressive disorder, recurrent severe without psychotic features: Secondary | ICD-10-CM

## 2021-04-01 MED ORDER — SERTRALINE HCL 100 MG PO TABS
ORAL_TABLET | Freq: Every day | ORAL | 2 refills | Status: DC
  Filled 2021-04-01: qty 60, 30d supply, fill #0

## 2021-04-01 MED ORDER — TRAZODONE HCL 50 MG PO TABS
ORAL_TABLET | Freq: Every evening | ORAL | 2 refills | Status: DC | PRN
  Filled 2021-04-01: qty 30, fill #0

## 2021-04-01 MED ORDER — HYDROXYZINE HCL 10 MG PO TABS
ORAL_TABLET | Freq: Three times a day (TID) | ORAL | 2 refills | Status: DC | PRN
  Filled 2021-04-01: qty 90, 30d supply, fill #0

## 2021-04-01 MED ORDER — ARIPIPRAZOLE 10 MG PO TABS
ORAL_TABLET | Freq: Every day | ORAL | 2 refills | Status: DC
  Filled 2021-04-01: qty 30, fill #0
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
## 2021-04-16 ENCOUNTER — Other Ambulatory Visit: Payer: Self-pay

## 2021-04-16 ENCOUNTER — Ambulatory Visit (INDEPENDENT_AMBULATORY_CARE_PROVIDER_SITE_OTHER): Payer: No Payment, Other | Admitting: Licensed Clinical Social Worker

## 2021-04-16 DIAGNOSIS — F4310 Post-traumatic stress disorder, unspecified: Secondary | ICD-10-CM
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
## 2021-05-02 ENCOUNTER — Ambulatory Visit (HOSPITAL_COMMUNITY): Payer: No Payment, Other | Admitting: Licensed Clinical Social Worker

## 2021-05-21 ENCOUNTER — Ambulatory Visit (INDEPENDENT_AMBULATORY_CARE_PROVIDER_SITE_OTHER): Payer: No Payment, Other | Admitting: Licensed Clinical Social Worker

## 2021-05-21 ENCOUNTER — Other Ambulatory Visit: Payer: Self-pay

## 2021-05-21 DIAGNOSIS — F4310 Post-traumatic stress disorder, unspecified: Secondary | ICD-10-CM

## 2021-05-23 NOTE — Progress Notes (Signed)
   THERAPIST PROGRESS NOTE   Virtual Visit via Video Note  I connected with Connie Cook on 05/21/21 at 10:00 AM EDT by a video enabled telemedicine application and verified that I am speaking with the correct person using two identifiers.  Location: Patient: Connie Cook's home, alone Provider: [facility]   I discussed the limitations of evaluation and management by telemedicine and the availability of in person appointments. The patient expressed understanding and agreed to proceed. I discussed the assessment and treatment plan with the patient. The patient was provided an opportunity to ask questions and all were answered. The patient agreed with the plan and demonstrated an understanding of the instructions.  I provided 20 minutes of non-face-to-face time during this encounter.  Participation Level: Minimal  Behavioral Response: CasualDrowsy and LethargicDepressed  Type of Therapy: Individual Therapy  Treatment Goals addressed: Communication: dep/anx/coping  Interventions: Other: Additional assessment  Summary: Connie Cook is a 60 y.o. female who presents with hx of PTSD. This date pt signs on for session late and is noted to be very drowsy. Pt reports she is dog sitting for a Connie Cook in [place]. She reports she took some allergy medicine and had fallen asleep. Pt has been dog sitting 2 wks and will continue to be there until the end of the month. She states how exhausted she is trying to keep up with the two dogs. She reports she was also watching another dog "down the road" going back and forth but those owners have come back home. LCSW assessed for pt taking her meds, particularly sleep med she was not using as much as she appeared to need it d/t sister's judgement. Pt reports she is using it more and reports "about 3 times a week". Pt is noted to be having a difficult time staying engaged, delayed in responses, talking with eyes closed. LCSW assessed for pt's need to end  session d/t her inability to stay alert and engaged. Pt states she would appreciate being able to lay back down. She insisted she was alright and again blamed the allergy medicine for her condition. Session ended prematurely.  Suicidal/Homicidal: Nowithout intent/plan  Therapist Response: Pt somewhat receptive to care.  Plan: Return again in ~5 weeks.  Diagnosis: Axis I: Post Traumatic Stress Disorder    Joppa Sink, LCSW 05/23/2021

## 2021-06-12 ENCOUNTER — Other Ambulatory Visit: Payer: Self-pay

## 2021-06-16 ENCOUNTER — Other Ambulatory Visit: Payer: Self-pay

## 2021-06-25 ENCOUNTER — Ambulatory Visit (INDEPENDENT_AMBULATORY_CARE_PROVIDER_SITE_OTHER): Payer: No Payment, Other | Admitting: Licensed Clinical Social Worker

## 2021-06-25 DIAGNOSIS — F4310 Post-traumatic stress disorder, unspecified: Secondary | ICD-10-CM

## 2021-06-27 NOTE — Progress Notes (Signed)
   THERAPIST PROGRESS NOTE   Virtual Visit via Video Note  I connected with Connie Cook on 06/25/21 at 11:00 AM EDT by a video enabled telemedicine application and verified that I am speaking with the correct person using two identifiers.  Location: Patient: [name] Provider: [facility]   I discussed the limitations of evaluation and management by telemedicine and the availability of in person appointments. The patient expressed understanding and agreed to proceed. I discussed the assessment and treatment plan with the patient. The patient was provided an opportunity to ask questions and all were answered. The patient agreed with the plan and demonstrated an understanding of the instructions.  I provided 45 minutes of non-face-to-face time during this encounter.  Participation Level: Active  Behavioral Response: CasualAlertAnxious and Depressed  Type of Therapy: Individual Therapy  Treatment Goals addressed: Communication: dep/anx/coping  Interventions: CBT, Solution Focused, and Supportive  Summary: Connie Cook is a 60 y.o. female who presents with hx of PTSD. This date pt signs on for video session. She is still dog sitting at a friend's house. Pt reports couple did come home for ~ a wk but have left again. Pt alert this session but is noted to have word finding problems intermittently throughout session. Pt states last session was "embarrassing" and she hardly recalls the abbreviated session. Assessment of overall emotional health reveals pt saying "I stay overwhelmed" and she feels "numb all the time". She reports fatigue, poor follow through, lack of motivation. Pt states she has to force herself to shower. She admits to emotional eating and is very displeased with self r/t weight. Pt states she is taking meds but forgets some times and thinks this could be as much as a couple times per wk. Pt also states she wonders if she needs a med adjustment to  a different regimen. Pt agrees to discuss this with med provider she sees in a wk. She agrees to start using pill box and will set alarm on phone to help with taking meds accurately. Much of session spent on pt's emotional eating and addressing strategies to alter behavior/thoughts. Pt agrees to write out a sustainable meal plan and use substitution on the sweets she takes in. Pt reflects on having anorexia/bulimia as a young girl and is dismayed at the shift in her behaviors/weight. She totally denies any current purging. Review of coping reveals pt has been doing some journaling and letter writing. Pt states it has been helpful to reduce anger. She states it's hard to get started but then she is able to write. LCSW encouraged exercise/walking, continue journaling. LCSW reviewed poc including scheduling prior to close of session. Pt states appreciation for care.   Suicidal/Homicidal: Nowithout intent/plan  Therapist Response: Pt remains receptive to care.  Plan: Return again in 4 weeks.  Diagnosis: Axis I: Post Traumatic Stress Disorder   Bethany Sink, LCSW 06/27/2021

## 2021-07-02 ENCOUNTER — Encounter (HOSPITAL_COMMUNITY): Payer: Self-pay | Admitting: Psychiatry

## 2021-07-02 ENCOUNTER — Other Ambulatory Visit: Payer: Self-pay

## 2021-07-02 ENCOUNTER — Telehealth (INDEPENDENT_AMBULATORY_CARE_PROVIDER_SITE_OTHER): Payer: No Payment, Other | Admitting: Psychiatry

## 2021-07-02 DIAGNOSIS — F332 Major depressive disorder, recurrent severe without psychotic features: Secondary | ICD-10-CM | POA: Diagnosis not present

## 2021-07-02 DIAGNOSIS — F411 Generalized anxiety disorder: Secondary | ICD-10-CM | POA: Diagnosis not present

## 2021-07-02 MED ORDER — ARIPIPRAZOLE 15 MG PO TABS
15.0000 mg | ORAL_TABLET | Freq: Every day | ORAL | 2 refills | Status: DC
  Filled 2021-07-02 – 2021-07-16 (×2): qty 30, 30d supply, fill #0

## 2021-07-02 MED ORDER — TRAZODONE HCL 50 MG PO TABS
50.0000 mg | ORAL_TABLET | Freq: Every evening | ORAL | 2 refills | Status: DC | PRN
Start: 2021-07-02 — End: 2021-09-24
  Filled 2021-07-02: qty 60, 30d supply, fill #0

## 2021-07-02 MED ORDER — HYDROXYZINE HCL 25 MG PO TABS
25.0000 mg | ORAL_TABLET | Freq: Three times a day (TID) | ORAL | 2 refills | Status: DC | PRN
  Filled 2021-07-02 – 2021-07-16 (×2): qty 90, 30d supply, fill #0

## 2021-07-02 MED ORDER — SERTRALINE HCL 100 MG PO TABS: ORAL_TABLET | Freq: Every day | ORAL | 2 refills | Status: DC

## 2021-07-02 NOTE — Progress Notes (Signed)
BH MD/PA/NP OP Progress Note Virtual Visit via Video Note  I connected with Connie Cook on 07/02/21 at  2:30 PM EDT by a video enabled telemedicine application and verified that I am speaking with the correct person using two identifiers.  Location: Patient: Home Provider: Clinic   I discussed the limitations of evaluation and management by telemedicine and the availability of in person appointments. The patient expressed understanding and agreed to proceed.  I provided 30 minutes of non-face-to-face time during this encounter.    07/02/2021 2:45 PM Connie Cook  MRN:  [id number]  Chief Complaint: "I have no motivation"  HPI: 60 year old female seen today for follow up psychiatric evaluation. She has a psychiatric history of SI, depression, and anxiety. She is currently managed on Zoloft 200 mg daily, hydroxyzine 10 mg 3 times daily, and trazodone 25 to 50 mg nightly as needed and Abilify 10 mg daily. She notes her medications are somewhat effective in managing her psychiatric conditions.   Today she was well groomed, pleasant, cooperative, and engaged in conversation. She informed provider that since her last visit she has not has motivation to do things. She notes that she has difficulty with bathing and brushing her teeth. She also notes that she has gained over ten pounds and notes that she is discouraged about her weight. She notes that she went from 217 pounds to about 228 pounds. Provider asked patient if she was eating healthy meals and she notes that she has not. She also notes that she is less active. Provider discussed the importance of a balanced diet and exercise. She reports that she wants to do Weight Watchers but lack to motivation to prepare her meals. Provider asked patient if she would be interested in a referral to a nutritionist however she notes that she was not interested. She asked writer if her PCP maybe able to give her a pill to lose weight.  Provider notes that she would have to discuss that with her PCP. She endorsed understanding and agreed.  Patient notes that the above worseness her anxiety and depression. Today provider conducted a GAD-7 and patient scored a 16, at her last visit she scored a 15.  Provider also conducted a PHQ-9 and patient scored a 24, at her last visit she scored a 13.  She endorses poor sleep noting that she sleeps 5 or less hours nightly.  Today she denies SI/HI/VAH or paranoia.   Today she is agreeable to increasing Abilify 10 mg to 15 mg to help manage depressive symptoms. She is also agreeable to increase hydroxyzine 10 mg three times daily to 25 mg TID. She will increase Trazodone 25 mg-50 mg to 50-100 mg to help manage sleep.  She will follow up with outpatient counseling for therapy. No other concerns noted at this time.  Visit Diagnosis:    ICD-10-CM   1. Severe episode of recurrent major depressive disorder, without psychotic features (HCC)  F33.2 ARIPiprazole (ABILIFY) 15 MG tablet    sertraline (ZOLOFT) 100 MG tablet    traZODone (DESYREL) 50 MG tablet    2. Generalized anxiety disorder  F41.1 hydrOXYzine (ATARAX/VISTARIL) 25 MG tablet      Past Psychiatric History:  SI, depression, and anxiety  Past Medical History:  Past Medical History:  Diagnosis Date   Anxiety    Arthritis    DDD (degenerative disc disease), cervical    Depression    H/O adenoidectomy    History of removal of ovarian cyst  Hyperlipidemia     Past Surgical History:  Procedure Laterality Date   APPENDECTOMY      Family Psychiatric History: Father Alcohol use, paternal aunt/uncle alcohol use   Family History:  Family History  Problem Relation Age of Onset   Alcohol abuse Father    Alcohol abuse Maternal Uncle    Alcohol abuse Paternal Uncle     Social History:  Social History   Socioeconomic History   Marital status: Single    Spouse name: Not on file   Number of children: 0   Years of education:  Not on file   Highest education level: Not on file  Occupational History   Not on file  Tobacco Use   Smoking status: Former   Smokeless tobacco: Former  Building services engineer Use: Never used  Substance and Sexual Activity   Alcohol use: Never   Drug use: Never   Sexual activity: Not on file  Other Topics Concern   Not on file  Social History Narrative   Not on file   Social Determinants of Health   Financial Resource Strain: Not on file  Food Insecurity: Not on file  Transportation Needs: Not on file  Physical Activity: Not on file  Stress: Not on file  Social Connections: Not on file    Allergies:  Allergies  Allergen Reactions   Codeine Shortness Of Breath   Penicillins Rash    Metabolic Disorder Labs: No results found for: HGBA1C, MPG No results found for: PROLACTIN No results found for: CHOL, TRIG, HDL, CHOLHDL, VLDL, LDLCALC No results found for: TSH  Therapeutic Level Labs: No results found for: LITHIUM No results found for: VALPROATE No components found for:  CBMZ  Current Medications: Current Outpatient Medications  Medication Sig Dispense Refill   ARIPiprazole (ABILIFY) 15 MG tablet Take 1 tablet (15 mg total) by mouth daily. 30 tablet 2   cephALEXin (KEFLEX) 500 MG capsule Take 500 mg by mouth 4 (four) times daily. (Patient not taking: No sig reported)     clindamycin (CLEOCIN) 150 MG capsule Take by mouth 3 (three) times daily. (Patient not taking: No sig reported)     cyclobenzaprine (FLEXERIL) 5 MG tablet Take 5 mg by mouth 3 (three) times daily as needed for muscle spasms.     HYDROcodone-acetaminophen (NORCO) 10-325 MG per tablet Take 1 tablet by mouth every 6 (six) hours as needed. (Patient not taking: No sig reported)     hydrOXYzine (ATARAX/VISTARIL) 25 MG tablet Take 1 tablet (25 mg total) by mouth 3 (three) times daily as needed. 90 tablet 2   LORazepam (ATIVAN) 1 MG tablet Take 1 mg by mouth daily.     Probiotic Product (PROBIOTIC DAILY PO)  Take by mouth. (Patient not taking: No sig reported)     rosuvastatin (CRESTOR) 20 MG tablet Take 20 mg by mouth daily. (Patient not taking: No sig reported)     sertraline (ZOLOFT) 100 MG tablet TAKE 2 TABLETS (200 MG TOTAL) BY MOUTH DAILY. 60 tablet 2   traZODone (DESYREL) 50 MG tablet Take 1-2 tablets (50-100 mg total) by mouth at bedtime as needed for sleep. 60 tablet 2   No current facility-administered medications for this visit.     Musculoskeletal: Strength & Muscle Tone:  Unable to assess due to telehealth visit Gait & Station:  Unable to assess due to telehealth visit Patient leans: N/A  Psychiatric Specialty Exam: Review of Systems  There were no vitals taken for this visit.There is  no height or weight on file to calculate BMI.  General Appearance: Well Groomed  Eye Contact:  Good  Speech:  Clear and Coherent and Slow  Volume:  Normal  Mood:  Anxious and Depressed  Affect:  Appropriate and Congruent  Thought Process:  Coherent, Goal Directed and Linear  Orientation:  Full (Time, Place, and Person)  Thought Content: WDL and Logical   Suicidal Thoughts:  Yes.  without intent/plan  Homicidal Thoughts:  No  Memory:  Immediate;   Good Recent;   Good Remote;   Good  Judgement:  Fair  Insight:  Good and Fair  Psychomotor Activity:  Normal  Concentration:  Concentration: Good and Attention Span: Good  Recall:  Good  Fund of Knowledge: Good  Language: Good  Akathisia:  No  Handed:  Right  AIMS (if indicated): Not done  Assets:  Communication Skills Desire for Improvement Financial Resources/Insurance Housing Social Support  ADL's:  Intact  Cognition: WNL  Sleep:  Poor   Screenings: GAD-7    Flowsheet Row Video Visit from 07/02/2021 in [facility] Video Visit from 04/01/2021 in [facility] Video Visit from 01/06/2021 in [facility] Counselor from 11/25/2020 in [facility]  Total GAD-7 Score 16 15 19 11       PHQ2-9    Flowsheet Row Video Visit from 07/02/2021 in [facility] Video Visit from 04/01/2021 in [facility] Counselor from 02/04/2021 in [facility] Video Visit from 01/06/2021 in [facility] Counselor from 12/04/2020 in [facility]  PHQ-2 Total Score 6 4 1 4 6   PHQ-9 Total Score 24 13 -- 21 23      Flowsheet Row Video Visit from 07/02/2021 in [facility] Video Visit from 04/01/2021 in [facility] Counselor from 02/04/2021 in [facility]  C-SSRS RISK CATEGORY Error: Q7 should not be populated when Q6 is No Error: Q3, 4, or 5 should not be populated when Q2 is No Low Risk        Assessment and Plan: Patient endorses symptoms of anxiety, depression, and insomnia. Today she is agreeable to increasing Abilify 10 mg to 15 mg to help manage depressive symptoms. She is also agreeable to increase hydroxyzine 10 mg three times daily to 25 mg TID. She will increase Trazodone 25 mg-50 mg to 50-100 mg to help manage sleep  1. Severe episode of recurrent major depressive disorder, without psychotic features (HCC)  Increased- ARIPiprazole (ABILIFY) 15 MG tablet; Take 1 tablet (15 mg total) by mouth daily.  Dispense: 30 tablet; Refill: 2 Continue- sertraline (ZOLOFT) 100 MG tablet; TAKE 2 TABLETS (200 MG TOTAL) BY MOUTH DAILY.  Dispense: 60 tablet; Refill: 2 Increased- traZODone (DESYREL) 50 MG tablet; Take 1-2 tablets (50-100 mg total) by mouth at bedtime as needed for sleep.  Dispense: 60 tablet; Refill: 2  2. Generalized anxiety disorder  Increased- hydrOXYzine (ATARAX/VISTARIL) 25 MG tablet; Take 1 tablet (25 mg total) by mouth 3 (three) times daily as needed.  Dispense: 90 tablet; Refill: 2   Follow-up in  3 Follow-up with therapy   02/06/2021, NP 07/02/2021, 2:45 PM

## 2021-07-07 ENCOUNTER — Other Ambulatory Visit: Payer: Self-pay

## 2021-07-09 ENCOUNTER — Other Ambulatory Visit: Payer: Self-pay

## 2021-07-14 ENCOUNTER — Telehealth (HOSPITAL_COMMUNITY): Payer: No Payment, Other | Admitting: Psychiatry

## 2021-07-16 ENCOUNTER — Other Ambulatory Visit: Payer: Self-pay

## 2021-07-25 ENCOUNTER — Ambulatory Visit (INDEPENDENT_AMBULATORY_CARE_PROVIDER_SITE_OTHER): Payer: No Payment, Other | Admitting: Licensed Clinical Social Worker

## 2021-07-25 ENCOUNTER — Other Ambulatory Visit: Payer: Self-pay

## 2021-07-25 DIAGNOSIS — F4310 Post-traumatic stress disorder, unspecified: Secondary | ICD-10-CM

## 2021-07-30 NOTE — Progress Notes (Signed)
   THERAPIST PROGRESS NOTE   Virtual Visit via Video Note  I connected with Connie Cook on 07/25/21 at  9:00 AM EDT by a video enabled telemedicine application and verified that I am speaking with the correct person using two identifiers.  Location: Patient: Home Provider: Home   I discussed the limitations of evaluation and management by telemedicine and the availability of in person appointments. The patient expressed understanding and agreed to proceed. I discussed the assessment and treatment plan with the patient. The patient was provided an opportunity to ask questions and all were answered. The patient agreed with the plan and demonstrated an understanding of the instructions.  I provided 40 minutes of non-face-to-face time during this encounter.  Participation Level: Active  Behavioral Response: CasualAlertAnxious and Depressed  Type of Therapy: Individual Therapy  Treatment Goals addressed: Communication: dep/anx/coping  Interventions: CBT, Solution Focused, and Supportive  Summary: Connie Cook is a 60 y.o. female who presents with hx of PTSD. Today pt is at her own home. She reports she ended up getting a back injury at the home where she was pet sitting. She reports a pinched nerve with sciatic pain. She went for MD appt and has started gabapentin which is helping yet pt states her mobility is limited. Not able to exercise at present. Pt reports she has anx r/t her soc sec physical tomorrow and states her psych assessment is in Nov. LCSW normalized some anxiousness and assisted to encourage realistic self talk r/t the experience. Encouraged pt to locate self talk lit and review. LCSW assessed for pt's MH med regimen. Pt reports she is taking meds as prescribed. She did not set an alarm as discussed and states she thought alarm was recommended for her getting up in the mornings. LCSW reframed to setting alarm for med reminders, which pt states she will do.  Pt states she has had Abilify ~ 3 wks now and reports she cannot tell it is helping thus far. Education provided on full effects. Most of remainder of session spent addressing pt's meal planning and weight loss goals. Weight is diminishing pt's self esteem. Pt states she has a new crock pot she hopes to make more healthy meals with. She also reports some successful substitutions for sweets, including sugar free dark chocolate pudding her sister had her try. Pt reports this satisfies her chocolate cravings well. Pt considering doing weight watchers program. Pt also states she cannot get out to shop so her sister is getting her groceries and getting healthy choices. LCSW provided encouragement. Reviewed coping skills. Assessed for what pt did for her 60th birthday. Pt reports she went out to dinner with fam and describes a nice time. LCSW reviewed poc including scheduling prior to close of session. Pt states appreciation for care.    Suicidal/Homicidal: Nowithout intent/plan  Therapist Response: Pt remains receptive to care.  Plan: Return again in ~4 weeks.  Diagnosis: Axis I: Post Traumatic Stress Disorder  [place] Sink, LCSW 07/30/2021

## 2021-08-27 ENCOUNTER — Other Ambulatory Visit: Payer: Self-pay

## 2021-08-27 ENCOUNTER — Ambulatory Visit (INDEPENDENT_AMBULATORY_CARE_PROVIDER_SITE_OTHER): Payer: No Payment, Other | Admitting: Licensed Clinical Social Worker

## 2021-08-27 DIAGNOSIS — F4310 Post-traumatic stress disorder, unspecified: Secondary | ICD-10-CM

## 2021-08-28 NOTE — Progress Notes (Signed)
   THERAPIST PROGRESS NOTE   Virtual Visit via Video Note  I connected with Connie Cook on 08/28/21 at 10:00 AM EDT by a video enabled telemedicine application and verified that I am speaking with the correct person using two identifiers.  Location: Patient: Home Provider: [facility]   I discussed the limitations of evaluation and management by telemedicine and the availability of in person appointments. The patient expressed understanding and agreed to proceed. I discussed the assessment and treatment plan with the patient. The patient was provided an opportunity to ask questions and all were answered. The patient agreed with the plan and demonstrated an understanding of the instructions.   The patient was advised to call back or seek an in-person evaluation if the symptoms worsen or if the condition fails to improve as anticipated.  I provided 35 minutes of non-face-to-face time during this encounter.  Participation Level: Active  Behavioral Response: CasualAlertAnxious and Depressed  Type of Therapy: Individual Therapy  Treatment Goals addressed: Communication: dep/anx/coping  Interventions: CBT and Supportive  Summary: Connie Cook is a 60 y.o. female who presents with history of depression anxiety and PTSD. This date patient signs on for video session and reports that her back is much better than it was.  Patient advises she is able to walk without a walker and able to drive again, back to doing most of her regular activities of daily living. Patient states she is no longer taking the gabapentin. She had an appointment today with a spine specialist but states she canceled that appointment because she is doing so much better. LCSW assessed for her physical with Social Security disability. Patient states that her sister took her to the appointment. She says the appointment was "odd" not thorough and very short. Patient states that she called the following day to provide  some additional information that she forgot and was told "it does not matter". Patient states overall she was very disappointed in the process. Patient reports that she has her comprehensive psychological evaluation on November 11 in [facility] and is hopeful this will go better. Patient reports that she plans to be able to resume her walking exercise outside since her back is better. LCSW assessed for status of her weight loss goals.  Patient states she has not lost or gained and was pleased with this given the fact that she has been so inactive with her back. Patient reports that she was able to do her own shopping last time and was able to provide self-control with what she purchased. Patient states she focused on fruits and vegetables. She was able to avoid purchasing sweets which is her main weakness. Pt reports her sister has used weight watchers before and provided her with a list of good things for snacks to stay satisfied. Pt states she is taking meds as prescribed. She has not set an alarm but feels she is keeping up with meds well with her system of leaving them out in the kitchen. Pt denies other worries/concerns. LCSW reviewed poc including scheduling prior to close of session. Pt states appreciation for care.   Suicidal/Homicidal: Nowithout intent/plan  Therapist Response: Pt remains receptive to care.  Plan: Return again for next avail appt.  Diagnosis: Axis I: Post Traumatic Stress Disorder    Sink, LCSW 08/28/2021

## 2021-09-24 ENCOUNTER — Telehealth (INDEPENDENT_AMBULATORY_CARE_PROVIDER_SITE_OTHER): Payer: No Payment, Other | Admitting: Psychiatry

## 2021-09-24 ENCOUNTER — Encounter (HOSPITAL_COMMUNITY): Payer: Self-pay | Admitting: Psychiatry

## 2021-09-24 ENCOUNTER — Other Ambulatory Visit: Payer: Self-pay

## 2021-09-24 DIAGNOSIS — F32A Depression, unspecified: Secondary | ICD-10-CM | POA: Diagnosis not present

## 2021-09-24 DIAGNOSIS — F411 Generalized anxiety disorder: Secondary | ICD-10-CM | POA: Diagnosis not present

## 2021-09-24 MED ORDER — SERTRALINE HCL 50 MG PO TABS
150.0000 mg | ORAL_TABLET | Freq: Every day | ORAL | 3 refills | Status: DC
  Filled 2021-09-24: qty 90, 30d supply, fill #0

## 2021-09-24 MED ORDER — HYDROXYZINE HCL 25 MG PO TABS
25.0000 mg | ORAL_TABLET | Freq: Three times a day (TID) | ORAL | 3 refills | Status: DC | PRN
  Filled 2021-09-24: qty 90, 30d supply, fill #0

## 2021-09-24 MED ORDER — TRAZODONE HCL 50 MG PO TABS
50.0000 mg | ORAL_TABLET | Freq: Every evening | ORAL | 3 refills | Status: DC | PRN
Start: 2021-09-24 — End: 2021-12-11
  Filled 2021-09-24: qty 60, 30d supply, fill #0

## 2021-09-24 MED ORDER — ARIPIPRAZOLE 15 MG PO TABS
15.0000 mg | ORAL_TABLET | Freq: Every day | ORAL | 3 refills | Status: DC
  Filled 2021-09-24: qty 30, 30d supply, fill #0

## 2021-09-24 NOTE — Progress Notes (Signed)
BH MD/PA/NP OP Progress Note Virtual Visit via Video Note  I connected with Connie Cook on 09/24/21 at  4:00 PM EST by a video enabled telemedicine application and verified that I am speaking with the correct person using two identifiers.  Location: Patient: Home Provider: Clinic   I discussed the limitations of evaluation and management by telemedicine and the availability of in person appointments. The patient expressed understanding and agreed to proceed.  I provided 30 minutes of non-face-to-face time during this encounter.    09/24/2021 4:27 PM Connie Cook  MRN:  [id number]  Chief Complaint: "I feel like the the Zoloft makes me numb "  HPI: 60 year old female seen today for follow up psychiatric evaluation. She has a psychiatric history of SI, depression, and anxiety. She is currently managed on Zoloft 200 mg daily, hydroxyzine 10 mg 3 times daily, and trazodone 25 to 50 mg nightly as needed and Abilify 10 mg daily. She notes her medications are somewhat effective in managing her psychiatric conditions.   Today she was well groomed, pleasant, cooperative, and engaged in conversation. She informed provider that she has been on Zoloft since her 35s and notes that she now feels that it is making her numb.  She notes that most days she continues to suffer from anxiety and depression.  She informed Clinical research associate that her current stressors include current approval for disability.  She notes that she goes for her hearing tomorrow.  Today provider conducted a GAD-7 and patient scored 11, at her last visit she scored a 16.  Provider also conducted PHQ-9 and patient scored a 13, at her last visit she scored a 24.  She notes that since increasing trazodone she has been sleeping approximately 8 hours nightly.  Today she denies SI/HI/VAH, mania, or paranoia.  Patient notes that she continues to work on her weight loss journey.  She notes that she is eating less sugar and reports that she  has lost approximately 10 pounds.    Today patient is agreeable to reducing Zoloft 200 mg to 150 mg.  Provider discussed doing GeneSight testing at next visit if patient's symptoms does not improve.  She endorsed understanding and agreed.  She will follow-up with outpatient counseling for therapy.  No other concerns at this time.    Visit Diagnosis:    ICD-10-CM   1. Mild depression  F32.A ARIPiprazole (ABILIFY) 15 MG tablet    sertraline (ZOLOFT) 50 MG tablet    traZODone (DESYREL) 50 MG tablet    2. Generalized anxiety disorder  F41.1 hydrOXYzine (ATARAX/VISTARIL) 25 MG tablet      Past Psychiatric History:  SI, depression, and anxiety  Past Medical History:  Past Medical History:  Diagnosis Date   Anxiety    Arthritis    DDD (degenerative disc disease), cervical    Depression    H/O adenoidectomy    History of removal of ovarian cyst    Hyperlipidemia     Past Surgical History:  Procedure Laterality Date   APPENDECTOMY      Family Psychiatric History: Father Alcohol use, paternal aunt/uncle alcohol use   Family History:  Family History  Problem Relation Age of Onset   Alcohol abuse Father    Alcohol abuse Maternal Uncle    Alcohol abuse Paternal Uncle     Social History:  Social History   Socioeconomic History   Marital status: Single    Spouse name: Not on file   Number of children: 0  Years of education: Not on file   Highest education level: Not on file  Occupational History   Not on file  Tobacco Use   Smoking status: Former   Smokeless tobacco: Former  Building services engineer Use: Never used  Substance and Sexual Activity   Alcohol use: Never   Drug use: Never   Sexual activity: Not on file  Other Topics Concern   Not on file  Social History Narrative   Not on file   Social Determinants of Health   Financial Resource Strain: Not on file  Food Insecurity: Not on file  Transportation Needs: Not on file  Physical Activity: Not on file   Stress: Not on file  Social Connections: Not on file    Allergies:  Allergies  Allergen Reactions   Codeine Shortness Of Breath   Penicillins Rash    Metabolic Disorder Labs: No results found for: HGBA1C, MPG No results found for: PROLACTIN No results found for: CHOL, TRIG, HDL, CHOLHDL, VLDL, LDLCALC No results found for: TSH  Therapeutic Level Labs: No results found for: LITHIUM No results found for: VALPROATE No components found for:  CBMZ  Current Medications: Current Outpatient Medications  Medication Sig Dispense Refill   ARIPiprazole (ABILIFY) 15 MG tablet Take 1 tablet (15 mg total) by mouth daily. 30 tablet 3   cephALEXin (KEFLEX) 500 MG capsule Take 500 mg by mouth 4 (four) times daily. (Patient not taking: No sig reported)     clindamycin (CLEOCIN) 150 MG capsule Take by mouth 3 (three) times daily. (Patient not taking: No sig reported)     cyclobenzaprine (FLEXERIL) 5 MG tablet Take 5 mg by mouth 3 (three) times daily as needed for muscle spasms.     HYDROcodone-acetaminophen (NORCO) 10-325 MG per tablet Take 1 tablet by mouth every 6 (six) hours as needed. (Patient not taking: No sig reported)     hydrOXYzine (ATARAX/VISTARIL) 25 MG tablet Take 1 tablet (25 mg total) by mouth 3 (three) times daily as needed. 90 tablet 3   LORazepam (ATIVAN) 1 MG tablet Take 1 mg by mouth daily.     Probiotic Product (PROBIOTIC DAILY PO) Take by mouth. (Patient not taking: No sig reported)     rosuvastatin (CRESTOR) 20 MG tablet Take 20 mg by mouth daily. (Patient not taking: No sig reported)     sertraline (ZOLOFT) 50 MG tablet Take 3 tablets (150 mg total) by mouth daily. 90 tablet 3   traZODone (DESYREL) 50 MG tablet Take 1-2 tablets (50-100 mg total) by mouth at bedtime as needed for sleep. 60 tablet 3   No current facility-administered medications for this visit.     Musculoskeletal: Strength & Muscle Tone:  Unable to assess due to telehealth visit Gait & Station:  Unable  to assess due to telehealth visit Patient leans: N/A  Psychiatric Specialty Exam: Review of Systems  There were no vitals taken for this visit.There is no height or weight on file to calculate BMI.  General Appearance: Well Groomed  Eye Contact:  Good  Speech:  Clear and Coherent and Slow  Volume:  Normal  Mood:  Anxious and Depressed  Affect:  Appropriate and Congruent  Thought Process:  Coherent, Goal Directed and Linear  Orientation:  Full (Time, Place, and Person)  Thought Content: WDL and Logical   Suicidal Thoughts:  No  Homicidal Thoughts:  No  Memory:  Immediate;   Good Recent;   Good Remote;   Good  Judgement:  Good  Insight:  Good  Psychomotor Activity:  Normal  Concentration:  Concentration: Good and Attention Span: Good  Recall:  Good  Fund of Knowledge: Good  Language: Good  Akathisia:  No  Handed:  Right  AIMS (if indicated): Not done  Assets:  Communication Skills Desire for Improvement Financial Resources/Insurance Housing Social Support  ADL's:  Intact  Cognition: WNL  Sleep:  Good   Screenings: GAD-7    Flowsheet Row Video Visit from 09/24/2021 in [facility] Video Visit from 07/02/2021 in [facility] Video Visit from 04/01/2021 in [facility] Video Visit from 01/06/2021 in [facility] Counselor from 11/25/2020 in [facility]  Total GAD-7 Score 11 16 15 19 11       PHQ2-9    Flowsheet Row Video Visit from 09/24/2021 in [facility] Video Visit from 07/02/2021 in [facility] Video Visit from 04/01/2021 in [facility] Counselor from 02/04/2021 in [facility] Video Visit from 01/06/2021 in [facility]  PHQ-2 Total Score 4 6 4 1 4   PHQ-9 Total Score 13 24 13  -- 21       Flowsheet Row Video Visit from 07/02/2021 in [facility] Video Visit from 04/01/2021 in [facility] Counselor from 02/04/2021 in [facility]  C-SSRS RISK CATEGORY Error: Q7 should not be populated when Q6 is No Error: Q3, 4, or 5 should not be populated when Q2 is No Low Risk        Assessment and Plan: Patient notes that her sleep, anxiety, and depression has somewhat improved since her last visit however she notes that Zoloft makes her feel numb.  At this time she is agreeable to reducing Zoloft 200 mg to 150 mg to help mitigate symptoms.  If reduction in medication does not improve symptoms provider discussed doing GeneSight testing at next visit which patient was agreeable to.  At this time she will continue all other medications as prescribed.    1. Generalized anxiety disorder  Continue- hydrOXYzine (ATARAX/VISTARIL) 25 MG tablet; Take 1 tablet (25 mg total) by mouth 3 (three) times daily as needed.  Dispense: 90 tablet; Refill: 3  2. Mild depression  Continue- ARIPiprazole (ABILIFY) 15 MG tablet; Take 1 tablet (15 mg total) by mouth daily.  Dispense: 30 tablet; Refill: 3 Reduce- sertraline (ZOLOFT) 50 MG tablet; Take 3 tablets (150 mg total) by mouth daily.  Dispense: 90 tablet; Refill: 3 Continue- traZODone (DESYREL) 50 MG tablet; Take 1-2 tablets (50-100 mg total) by mouth at bedtime as needed for sleep.  Dispense: 60 tablet; Refill: 3  Follow-up in 3 months Follow-up with their Follow-up in 3 Follow-up with therapy   [facility], NP 09/24/2021, 4:27 PM

## 2021-09-25 ENCOUNTER — Other Ambulatory Visit: Payer: Self-pay

## 2021-09-26 ENCOUNTER — Other Ambulatory Visit: Payer: Self-pay

## 2021-10-01 ENCOUNTER — Other Ambulatory Visit: Payer: Self-pay

## 2021-10-30 ENCOUNTER — Ambulatory Visit (HOSPITAL_COMMUNITY): Payer: No Payment, Other | Admitting: Licensed Clinical Social Worker

## 2021-12-01 ENCOUNTER — Encounter (HOSPITAL_COMMUNITY): Payer: Self-pay

## 2021-12-01 ENCOUNTER — Ambulatory Visit (HOSPITAL_COMMUNITY): Payer: No Payment, Other | Admitting: Licensed Clinical Social Worker

## 2021-12-01 ENCOUNTER — Telehealth (HOSPITAL_COMMUNITY): Payer: Self-pay | Admitting: Licensed Clinical Social Worker

## 2021-12-01 NOTE — Telephone Encounter (Signed)
LCSW sent text message with link for video session per schedule. LCSW remained online available for session until 10:12. Pt failed to sign on for session. 

## 2021-12-11 ENCOUNTER — Encounter (HOSPITAL_COMMUNITY): Payer: Self-pay | Admitting: Psychiatry

## 2021-12-11 ENCOUNTER — Telehealth (INDEPENDENT_AMBULATORY_CARE_PROVIDER_SITE_OTHER): Payer: No Payment, Other | Admitting: Psychiatry

## 2021-12-11 ENCOUNTER — Other Ambulatory Visit: Payer: Self-pay

## 2021-12-11 DIAGNOSIS — F411 Generalized anxiety disorder: Secondary | ICD-10-CM | POA: Diagnosis not present

## 2021-12-11 DIAGNOSIS — F32A Depression, unspecified: Secondary | ICD-10-CM | POA: Diagnosis not present

## 2021-12-11 DIAGNOSIS — F4310 Post-traumatic stress disorder, unspecified: Secondary | ICD-10-CM | POA: Diagnosis not present

## 2021-12-11 MED ORDER — ARIPIPRAZOLE 20 MG PO TABS
20.0000 mg | ORAL_TABLET | Freq: Every day | ORAL | 3 refills | Status: DC
  Filled 2021-12-11 – 2021-12-22 (×2): qty 30, 30d supply, fill #0

## 2021-12-11 MED ORDER — HYDROXYZINE HCL 25 MG PO TABS
25.0000 mg | ORAL_TABLET | Freq: Three times a day (TID) | ORAL | 3 refills | Status: DC | PRN
  Filled 2021-12-11: qty 90, 30d supply, fill #0

## 2021-12-11 MED ORDER — GABAPENTIN 300 MG PO CAPS
300.0000 mg | ORAL_CAPSULE | Freq: Three times a day (TID) | ORAL | 3 refills | Status: DC
  Filled 2021-12-11 – 2021-12-22 (×2): qty 90, 30d supply, fill #0

## 2021-12-11 MED ORDER — SERTRALINE HCL 50 MG PO TABS
150.0000 mg | ORAL_TABLET | Freq: Every day | ORAL | 3 refills | Status: DC
  Filled 2021-12-11: qty 90, 30d supply, fill #0
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
## 2021-12-18 ENCOUNTER — Other Ambulatory Visit: Payer: Self-pay

## 2021-12-22 ENCOUNTER — Other Ambulatory Visit: Payer: Self-pay

## 2021-12-26 ENCOUNTER — Other Ambulatory Visit: Payer: Self-pay

## 2021-12-29 ENCOUNTER — Other Ambulatory Visit: Payer: Self-pay

## 2022-01-23 ENCOUNTER — Ambulatory Visit (HOSPITAL_COMMUNITY): Payer: No Payment, Other | Admitting: Licensed Clinical Social Worker

## 2022-01-28 ENCOUNTER — Ambulatory Visit (INDEPENDENT_AMBULATORY_CARE_PROVIDER_SITE_OTHER): Payer: No Payment, Other | Admitting: Licensed Clinical Social Worker

## 2022-01-28 DIAGNOSIS — F4310 Post-traumatic stress disorder, unspecified: Secondary | ICD-10-CM | POA: Diagnosis not present

## 2022-01-28 NOTE — Progress Notes (Signed)
? ?THERAPIST PROGRESS NOTE ? ? ?Virtual Visit via Video Note ? ?I connected with Connie Cook on 01/28/22 at  1:00 PM EDT by a video enabled telemedicine application and verified that I am speaking with the correct person using two identifiers. ? ?Location: ?Patient: Home  ?Provider: [facility] ?  ?I discussed the limitations of evaluation and management by telemedicine and the availability of in person appointments. The patient expressed understanding and agreed to proceed. ?I discussed the assessment and treatment plan with the patient. The patient was provided an opportunity to ask questions and all were answered. The patient agreed with the plan and demonstrated an understanding of the instructions. ?  ?The patient was advised to call back or seek an in-person evaluation if the symptoms worsen or if the condition fails to improve as anticipated. ? ?I provided 33 minutes of non-face-to-face time during this encounter. ? ?Participation Level: Active ? ?Behavioral Response: CasualAlertAnxious and Depressed ? ?Type of Therapy: Individual Therapy ? ?Treatment Goals addressed: anx/dep/stressors/coping ? ?ProgressTowards Goals: Progressing minimally ? ?Interventions: Supportive and Other: additional assessment ? ?Summary: Connie Cook is a 61 y.o. female who presents with hx of PTSD.  Today patient logs on for video session per schedule.  LCSW is able to see patient for a brief period of time before video fails however audio states clear.  Patient reports she can continue to see clinician and session continues.  Connie Cook was last seen August 27, 2021.  Gap in care addressed.  LCSW assessed for any significant changes during gap.  Patient advises she was denied disability.  She has retained an attorney to help her with an appeal.  LCSW provided education on how common it is to be denied.  Patient validates that she has been informed of this and told not to expect disability on her first try.  Patient  states she has been told she needs to see her PCP on a more regular basis for documentation of her conditions yet feels she cannot afford this.  LCSW provided information and referral on [facility].  She advises she gets her medications there but was told they were not taking new patients sometime back when she asked about PCP services.  LCSW encouraged her to call to see if she could be placed on a waiting list which patient agrees to do.  She states she worries about not getting Disability at all since she continues to have no income and does not feel capable of working.  Patient continues to live in her nephew's home and has support of her sister.  She states she is taking medications as prescribed.  Patient reports despite meds she continues to have difficulty with social anxiety, is isolating often and states she gets "overwhelmed very easily".  When asked patient reports she is continuing to do journaling and found letter writing helpful, she is reading for positive distraction.  LCSW assessed for patient walking outdoors.  Patient reports it has been some time since she has followed through with this goal.  Again discussed benefits of walking outside in natural daylight.  Patient agrees with weather breaking that she will try to start walking again, will use her calendar to schedule walking like an appointment and will start with small obtainable goals. Pt denies other concerns to address this date. LCSW advised patient of this clinician's resignation from counseling position at [facility].  Patient reports regret but understanding.  LCSW assisted patient to process thoughts and feelings  related to changing clinicians.  LCSW provided information on transition plan.  Patient verbalizes understanding and states appreciation for care. ? ?Suicidal/Homicidal: Nowithout intent/plan ? ?Therapist Response: Pt mostly receptive to care. ? ?Plan: Return again for next avail  session with new clinician as this LCSW has resigned. ? ?Diagnosis: PTSD ? ?Collaboration of Care: Other None deemed necessary this session. ? ?Patient/Guardian was advised Release of Information must be obtained prior to any record release in order to collaborate their care with an outside provider. Patient/Guardian was advised if they have not already done so to contact the registration department to sign all necessary forms in order for [place] to release information regarding their care.  ? ?Consent: Patient/Guardian gives verbal consent for treatment and assignment of benefits for services provided during this visit. Patient/Guardian expressed understanding and agreed to proceed.  ? ?Combine Sink, LCSW ?01/28/2022 ? ?

## 2022-02-23 ENCOUNTER — Ambulatory Visit (INDEPENDENT_AMBULATORY_CARE_PROVIDER_SITE_OTHER): Payer: No Payment, Other | Admitting: Licensed Clinical Social Worker

## 2022-02-23 DIAGNOSIS — F332 Major depressive disorder, recurrent severe without psychotic features: Secondary | ICD-10-CM

## 2022-02-23 DIAGNOSIS — F411 Generalized anxiety disorder: Secondary | ICD-10-CM | POA: Diagnosis not present
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
## 2022-02-25 ENCOUNTER — Telehealth (HOSPITAL_COMMUNITY): Payer: No Payment, Other | Admitting: Psychiatry

## 2022-02-26 ENCOUNTER — Telehealth (INDEPENDENT_AMBULATORY_CARE_PROVIDER_SITE_OTHER): Payer: No Payment, Other | Admitting: Psychiatry

## 2022-02-26 ENCOUNTER — Other Ambulatory Visit: Payer: Self-pay

## 2022-02-26 DIAGNOSIS — F411 Generalized anxiety disorder: Secondary | ICD-10-CM

## 2022-02-26 DIAGNOSIS — F32A Depression, unspecified: Secondary | ICD-10-CM

## 2022-02-26 MED ORDER — SERTRALINE HCL 50 MG PO TABS
150.0000 mg | ORAL_TABLET | Freq: Every day | ORAL | 2 refills | Status: DC
  Filled 2022-02-26: qty 90, 30d supply, fill #0

## 2022-02-26 MED ORDER — GABAPENTIN 300 MG PO CAPS
300.0000 mg | ORAL_CAPSULE | Freq: Three times a day (TID) | ORAL | 2 refills | Status: DC
Start: 2022-02-26 — End: 2022-04-29
  Filled 2022-02-26: qty 90, 30d supply, fill #0

## 2022-02-26 MED ORDER — HYDROXYZINE HCL 25 MG PO TABS
25.0000 mg | ORAL_TABLET | Freq: Three times a day (TID) | ORAL | 2 refills | Status: DC | PRN
  Filled 2022-02-26: qty 90, 30d supply, fill #0

## 2022-02-26 MED ORDER — ARIPIPRAZOLE 20 MG PO TABS
20.0000 mg | ORAL_TABLET | Freq: Every day | ORAL | 2 refills | Status: DC
  Filled 2022-02-26: qty 30, 30d supply, fill #0

## 2022-02-26 NOTE — Progress Notes (Signed)
BH MD/PA/NP OP Progress Note ? ?02/26/2022 10:20 AM ?Connie Cook  ?MRN:  [id number] ? ?Virtual Visit via Video Note ? ?I connected with Connie Cook on 02/26/22 at 10:00 AM EDT by a video enabled telemedicine application and verified that I am speaking with the correct person using two identifiers. ? ?Location: ?Patient: home ?Provider: offsite ?  ?I discussed the limitations of evaluation and management by telemedicine and the availability of in person appointments. The patient expressed understanding and agreed to proceed. ? ? ?  ?I discussed the assessment and treatment plan with the patient. The patient was provided an opportunity to ask questions and all were answered. The patient agreed with the plan and demonstrated an understanding of the instructions. ?  ?The patient was advised to call back or seek an in-person evaluation if the symptoms worsen or if the condition fails to improve as anticipated. ? ?I provided 10 minutes of non-face-to-face time during this encounter. ? ? ?Mcneil Sober, NP  ? ?Chief Complaint: Medication management ? ?HPI: Connie Cook is a 61 year old female presenting to [facility] behavioral health outpatient for a follow psychiatric evaluation. She has a psychiatric history of PTSD, depression and anxiety. Her symptoms are managed with ability 20 mg daily, gabapentin 300 mg three times daily, hydroxyzine 25 mg three times daily as needed for anxiety, and Zoloft 150 mg daily. Patient reports medication compliance and effectiveness, although she continues to feel a lack of motivation at times. Patient reports beginning therapy with a new therapist recently and is hopeful for better symptom management. Patient encouraged to participate in enjoyable activities outside of the home, as she states that she is unemployed now and sedentary. Patient to continue with therapy. No medication changes today. Medications refilled at current dosages. ? ?Patient is alert and  oriented x 4, calm, pleasant and willing to engage. She reports an "okay" mood with good sleep and appetite. She appears well groomed and dressed appropriately for the weather. She denies SI/HI/AVH/paranoia or delusional thought. ? ?Visit Diagnosis:  ?  ICD-10-CM   ?1. Generalized anxiety disorder  F41.1 gabapentin (NEURONTIN) 300 MG capsule  ?  hydrOXYzine (ATARAX) 25 MG tablet  ?  ?2. Mild depression  F32.A ARIPiprazole (ABILIFY) 20 MG tablet  ?  sertraline (ZOLOFT) 50 MG tablet  ?  ? ? ?Past Psychiatric History: MDD, PTSD, GAD ? ?Past Medical History:  ?Past Medical History:  ?Diagnosis Date  ? Anxiety   ? Arthritis   ? DDD (degenerative disc disease), cervical   ? Depression   ? H/O adenoidectomy   ? History of removal of ovarian cyst   ? Hyperlipidemia   ?  ?Past Surgical History:  ?Procedure Laterality Date  ? APPENDECTOMY    ? ? ?Family Psychiatric History: see below ? ?Family History:  ?Family History  ?Problem Relation Age of Onset  ? Alcohol abuse Father   ? Alcohol abuse Maternal Uncle   ? Alcohol abuse Paternal Uncle   ? ? ?Social History:  ?Social History  ? ?Socioeconomic History  ? Marital status: Single  ?  Spouse name: Not on file  ? Number of children: 0  ? Years of education: Not on file  ? Highest education level: Not on file  ?Occupational History  ? Not on file  ?Tobacco Use  ? Smoking status: Former  ? Smokeless tobacco: Former  ?Vaping Use  ? Vaping Use: Never used  ?Substance and Sexual Activity  ? Alcohol use: Never  ?  Drug use: Never  ? Sexual activity: Not on file  ?Other Topics Concern  ? Not on file  ?Social History Narrative  ? Not on file  ? ?Social Determinants of Health  ? ?Financial Resource Strain: Not on file  ?Food Insecurity: Not on file  ?Transportation Needs: Not on file  ?Physical Activity: Not on file  ?Stress: Not on file  ?Social Connections: Not on file  ? ? ?Allergies:  ?Allergies  ?Allergen Reactions  ? Codeine Shortness Of Breath  ? Penicillins Rash  ? ? ?Metabolic  Disorder Labs: ?No results found for: HGBA1C, MPG ?No results found for: PROLACTIN ?No results found for: CHOL, TRIG, HDL, CHOLHDL, VLDL, LDLCALC ?No results found for: TSH ? ?Therapeutic Level Labs: ?No results found for: LITHIUM ?No results found for: VALPROATE ?No components found for:  CBMZ ? ?Current Medications: ?Current Outpatient Medications  ?Medication Sig Dispense Refill  ? ARIPiprazole (ABILIFY) 20 MG tablet Take 1 tablet (20 mg total) by mouth daily. 30 tablet 2  ? cephALEXin (KEFLEX) 500 MG capsule Take 500 mg by mouth 4 (four) times daily. (Patient not taking: No sig reported)    ? clindamycin (CLEOCIN) 150 MG capsule Take by mouth 3 (three) times daily. (Patient not taking: No sig reported)    ? cyclobenzaprine (FLEXERIL) 5 MG tablet Take 5 mg by mouth 3 (three) times daily as needed for muscle spasms.    ? gabapentin (NEURONTIN) 300 MG capsule Take 1 capsule (300 mg total) by mouth 3 (three) times daily. 90 capsule 2  ? HYDROcodone-acetaminophen (NORCO) 10-325 MG per tablet Take 1 tablet by mouth every 6 (six) hours as needed. (Patient not taking: No sig reported)    ? hydrOXYzine (ATARAX) 25 MG tablet Take 1 tablet (25 mg total) by mouth 3 (three) times daily as needed. 90 tablet 2  ? LORazepam (ATIVAN) 1 MG tablet Take 1 mg by mouth daily.    ? Probiotic Product (PROBIOTIC DAILY PO) Take by mouth. (Patient not taking: No sig reported)    ? rosuvastatin (CRESTOR) 20 MG tablet Take 20 mg by mouth daily. (Patient not taking: No sig reported)    ? sertraline (ZOLOFT) 50 MG tablet Take 3 tablets (150 mg total) by mouth daily. 90 tablet 2  ? ?No current facility-administered medications for this visit.  ? ? ? ?Musculoskeletal: ?Strength & Muscle Tone:  n/a virtual visit ?Gait & Station:  n/a ?Patient leans: N/A ? ?Psychiatric Specialty Exam: ?Review of Systems  ?Psychiatric/Behavioral:  Negative for hallucinations, self-injury and suicidal ideas.   ?All other systems reviewed and are negative.  ?There  were no vitals taken for this visit.There is no height or weight on file to calculate BMI.  ?General Appearance: Well Groomed  ?Eye Contact:  Good  ?Speech:  Clear and Coherent  ?Volume:  Normal  ?Mood:  Euthymic  ?Affect:  Congruent  ?Thought Process:  Coherent  ?Orientation:  Full (Time, Place, and Person)  ?Thought Content: Logical   ?Suicidal Thoughts:  No  ?Homicidal Thoughts:  No  ?Memory:  Immediate;   Good ?Recent;   Good ?Remote;   Good  ?Judgement:  Good  ?Insight:  Good  ?Psychomotor Activity:  NA  ?Concentration:  Concentration: Good and Attention Span: Good  ?Recall:  Good  ?Fund of Knowledge: Good  ?Language: Good  ?Akathisia:  NA  ?Handed:  Right  ?AIMS (if indicated): not done  ?Assets:  Communication Skills  ?ADL's:  Intact  ?Cognition: WNL  ?Sleep:  Good  ? ?  Screenings: ?GAD-7   ? ?Flowsheet Row Counselor from 02/23/2022 in [facility] Video Visit from 12/11/2021 in [facility] Video Visit from 09/24/2021 in [facility] Video Visit from 07/02/2021 in [facility] Video Visit from 04/01/2021 in [facility]  ?Total GAD-7 Score 21 15 11 16 15   ? ?  ? ?PHQ2-9   ? ?Flowsheet Row Counselor from 02/23/2022 in [facility] Video Visit from 12/11/2021 in [facility] Video Visit from 09/24/2021 in [facility] Video Visit from 07/02/2021 in [facility] Video Visit from 04/01/2021 in [facility]  ?PHQ-2 Total Score 6 4 4 6 4   ?PHQ-9 Total Score 27 15 13 24 13   ? ?  ? ?Flowsheet Row Counselor from 02/23/2022 in [facility] Video Visit from 12/11/2021 in [facility] Video Visit from 07/02/2021 in [facility]  ?C-SSRS RISK CATEGORY Error: Q3, 4, or 5 should  not be populated when Q2 is No Error: Q7 should not be populated when Q6 is No Error: Q7 should not be populated when Q6 is No  ? ?  ? ? ? ?Assessment and Plan:  Connie Cook is a 61 year old female presentin

## 2022-03-04 ENCOUNTER — Other Ambulatory Visit: Payer: Self-pay

## 2022-03-10 NOTE — Progress Notes (Incomplete)
Virtual Visit via Video Note ? ?I connected with Connie Cook on 03/11/2022 at 10:00 AM EDT by a video enabled telemedicine application and verified that I am speaking with the correct person using two identifiers. ? ?Location: ?Patient: *** ?Provider: *** ?  ?I discussed the limitations of evaluation and management by telemedicine and the availability of in person appointments. The patient expressed understanding and agreed to proceed. ? ?  ?I discussed the assessment and treatment plan with the patient. The patient was provided an opportunity to ask questions and all were answered. The patient agreed with the plan and demonstrated an understanding of the instructions. ?  ?The patient was advised to call back or seek an in-person evaluation if the symptoms worsen or if the condition fails to improve as anticipated. ? ?I provided *** minutes of non-face-to-face time during this encounter. ? ? ?Wyvonnia Lora, LCSWA ? ? ? ?THERAPIST PROGRESS NOTE ? ?Session Time: *** ? ?Participation Level: {BHH PARTICIPATION LEVEL:22264} ? ?Behavioral Response: {Appearance:22683}{BHH LEVEL OF CONSCIOUSNESS:22305}{BHH MOOD:22306} ? ?Type of Therapy: Individual Therapy ? ?Treatment Goals addressed: establish tx goals ? ?ProgressTowards Goals: {Progress Towards Goals:21014066} ? ?Interventions: {CHL AMB BH Type of Intervention:21022753} ? ?Summary: Connie Cook is a 61 y.o. female who presents for f/u. Pt reports her current symptoms are ***  ? ?Suicidal/Homicidal: {BHH YES OR NO:22294}{yes/no/with/without intent/plan:22693} ? ?Therapist Response: *** ? ?Plan: Return again in *** weeks. ? ?Diagnosis: No diagnosis found. ? ?Collaboration of Care: Other chart review of previous therapist's notes. ? ?Patient/Guardian was advised Release of Information must be obtained prior to any record release in order to collaborate their care with an outside provider. Patient/Guardian was advised if they have not already done so  to contact the registration department to sign all necessary forms in order for [place] to release information regarding their care.  ? ?Consent: Patient/Guardian gives verbal consent for treatment and assignment of benefits for services provided during this visit. Patient/Guardian expressed understanding and agreed to proceed.  ? ?Wyvonnia Lora, LCSWA ?03/10/2022 ? ?

## 2022-03-11 ENCOUNTER — Ambulatory Visit (HOSPITAL_COMMUNITY): Payer: Commercial Managed Care - PPO | Admitting: Licensed Clinical Social Worker

## 2022-04-01 ENCOUNTER — Ambulatory Visit (INDEPENDENT_AMBULATORY_CARE_PROVIDER_SITE_OTHER): Payer: No Payment, Other | Admitting: Licensed Clinical Social Worker

## 2022-04-01 DIAGNOSIS — F411 Generalized anxiety disorder: Secondary | ICD-10-CM

## 2022-04-01 DIAGNOSIS — F332 Major depressive disorder, recurrent severe without psychotic features: Secondary | ICD-10-CM

## 2022-04-01 NOTE — Progress Notes (Signed)
Virtual Visit via Video Note ? ?I connected with Tarnisha [name] Fake on 04/01/22 at  4:00 PM EDT by a video enabled telemedicine application and verified that I am speaking with the correct person using two identifiers. ? ?Location: ?Patient: pt's home ?Provider: Clinical office ?  ?I discussed the limitations of evaluation and management by telemedicine and the availability of in person appointments. The patient expressed understanding and agreed to proceed. ? ?  ?I discussed the assessment and treatment plan with the patient. The patient was provided an opportunity to ask questions and all were answered. The patient agreed with the plan and demonstrated an understanding of the instructions. ?  ?The patient was advised to call back or seek an in-person evaluation if the symptoms worsen or if the condition fails to improve as anticipated. ? ?I provided 45 minutes of non-face-to-face time during this encounter. ? ? ?Heron Nay, LCSWA ? ? ? ?THERAPIST PROGRESS NOTE ? ?Session Time: 45 minutes ? ?Participation Level: Active ? ?Behavioral Response:  UTAAlertAnxious and Depressed ? ?Type of Therapy: Individual Therapy ? ?Treatment Goals addressed: anxiety, self-esteem ? ?ProgressTowards Goals: Progressing ? ?Interventions: CBT and Reframing ? ?Summary: Tametra Ahart is a 61 y.o. female who presents for f/u with this cln. She signs on early per cln request. She reports her moods have been "up and down." "I just worry about- I have social anxiety and it makes it hard. I worry about how things are gonna work out. I'm trying to get disability and they turned me down before." Reports she has always had social anxiety and feels that it has worsened. "I think I have performance anxieties like with my past jobs. I can't do the jobs like I used to. I get that low self-esteem, like what can I do?" Reports feeling anxious in public and with loved ones. "I feel judged or inadequate. It's almost like I'm too  self-aware. I'm just not like I used to be. I've always had low self-esteem." Pt receptive to working on self-esteem during session. When asked what self-care would look like for herself, pt reports "read a book, go walking. I like a good movie. I like quiet." States she does not do these things often. "I haven't been able to find a good book I would enjoy. It's hard to concentrate. Sometimes I'll go outside and sit." Expresses some resentment toward sister related to her perception that her sister's needs were met while hers were not. She also reports her father was an alcoholic and that she's never been able to put herself first. She details some childhood history in which she felt inadequate compared to her sister. She states, "My sister was always good at getting what she needed." Receptive to reframing from cln. She agrees to [facility] assessment and requests f/u appts with this cln to be in-person. ? ?Suicidal/Homicidal: Nowithout intent/plan ? ?Therapist Response: Cln assessed for current stressors, symptoms, and safety since last session. Cln utilized active listening and validation to assist with processing. Cln discussed definition of self-love and challenged cognitive distortions and core beliefs pt expressed. Discussed revisiting PHP and pt agreed. Cln scheduled PHP CCA and 3 f/u appointments and confirmed pt's availability and preferred method of service delivery (in-person, moving forward). ? ?Plan: Pursue PHP and return again in 6 weeks. ? ?Diagnosis: Severe episode of recurrent major depressive disorder, without psychotic features ([place]) ? ?Generalized anxiety disorder ? ?Collaboration of Care: Other PHP referral ? ?Patient/Guardian was advised Release of Information must be obtained  prior to any record release in order to collaborate their care with an outside provider. Patient/Guardian was advised if they have not already done so to contact the registration department to sign all necessary forms in order  for [place] to release information regarding their care.  ? ?Consent: Patient/Guardian gives verbal consent for treatment and assignment of benefits for services provided during this visit. Patient/Guardian expressed understanding and agreed to proceed.  ? ?Heron Nay, LCSWA ?04/01/2022 ? ?

## 2022-04-03 ENCOUNTER — Ambulatory Visit (HOSPITAL_COMMUNITY): Payer: Commercial Managed Care - PPO | Admitting: Licensed Clinical Social Worker

## 2022-04-03 ENCOUNTER — Telehealth (HOSPITAL_COMMUNITY): Payer: Self-pay | Admitting: Licensed Clinical Social Worker

## 2022-04-03 ENCOUNTER — Encounter (HOSPITAL_COMMUNITY): Payer: Self-pay

## 2022-04-03 DIAGNOSIS — F411 Generalized anxiety disorder: Secondary | ICD-10-CM

## 2022-04-03 DIAGNOSIS — F332 Major depressive disorder, recurrent severe without psychotic features: Secondary | ICD-10-CM

## 2022-04-03 NOTE — Plan of Care (Signed)
  Problem: Depression CCP Problem  1 Learn and Apply Coping Skills to Decrease Depression and Anxiety Symptoms   Goal: LTG: Connie "[name]" WILL SCORE LESS THAN 10 ON THE PATIENT HEALTH QUESTIONNAIRE (PHQ-9) Outcome: Not Applicable Goal: STG: Connie "[name]" WILL ATTEND AT LEAST 80% OF SCHEDULED PHP SESSIONS Outcome: Not Applicable Goal: STG: Connie "[name]" WILL ATTEND AT LEAST 80% OF SCHEDULED GROUP PSYCHOTHERAPY SESSIONS Outcome: Not Applicable Goal: STG: Connie "[name]" WILL COMPLETE AT LEAST 80% OF ASSIGNED HOMEWORK Outcome: Not Applicable Goal: STG: Reduce overall depression score by a minimum of 25% on the Patient Health Questionnaire (PHQ-9) or the Montgomery-Asberg Depression Rating Scale (MADRS) Outcome: Not Applicable Goal: STG: Connie "[name]" WILL IDENTIFY AT LEAST 3 COGNITIVE PATTERNS AND BELIEFS THAT SUPPORT DEPRESSION Outcome: Not Applicable
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
## 2022-04-06 ENCOUNTER — Ambulatory Visit (HOSPITAL_COMMUNITY): Payer: Commercial Managed Care - PPO

## 2022-04-07 ENCOUNTER — Encounter (HOSPITAL_COMMUNITY): Payer: Self-pay

## 2022-04-07 ENCOUNTER — Ambulatory Visit (INDEPENDENT_AMBULATORY_CARE_PROVIDER_SITE_OTHER): Payer: No Payment, Other | Admitting: Licensed Clinical Social Worker

## 2022-04-07 DIAGNOSIS — F332 Major depressive disorder, recurrent severe without psychotic features: Secondary | ICD-10-CM

## 2022-04-07 DIAGNOSIS — F411 Generalized anxiety disorder: Secondary | ICD-10-CM

## 2022-04-07 NOTE — Progress Notes (Signed)
Virtual Visit via Video Note  I connected with Connie Cook on 04/07/22 at  9:00 AM EDT by a video enabled telemedicine application and verified that I am speaking with the correct person using two identifiers.  Location: Patient: Home Provider: office   I discussed the limitations of evaluation and management by telemedicine and the availability of in person appointments. The patient expressed understanding and agreed to proceed.    I discussed the assessment and treatment plan with the patient. The patient was provided an opportunity to ask questions and all were answered. The patient agreed with the plan and demonstrated an understanding of the instructions.   The patient was advised to call back or seek an in-person evaluation if the symptoms worsen or if the condition fails to improve as anticipated.  I provided 15 minutes of non-face-to-face time during this encounter.   Oneta Rack, NP    Psychiatric Initial Adult Assessment   Patient Identification: Connie Cook MRN:  [id number] Date of Evaluation:  04/07/2022 Referral Source: [facility] Chief Complaint:  No chief complaint on file.  Visit Diagnosis: No diagnosis found.  History of Present Illness:  Connie Cook is a 61 year old Caucasian female who presents due to worsening depression and anxiety.  Reports stressors related to financial and medical.  States she was recently declined for disability which is making her anxiety skyrocket.  She denied suicidal or homicidal ideations.  Denies auditory visual hallucinations.  Reports she is currently followed by therapy and psychiatry Dr. Karen Kitchens nurse practitioner and therapist [name] LCSW.  Denied previous inpatient admissions. Patient to be admitted to [facility] on 04/07/2022  Associated Signs/Symptoms: Depression Symptoms:  depressed mood, difficulty concentrating, anxiety, disturbed sleep, (Hypo) Manic Symptoms:   Distractibility, Anxiety Symptoms:  Excessive Worry, Psychotic Symptoms:  Hallucinations: None PTSD Symptoms: NA  Past Psychiatric History:   Previous Psychotropic Medications: Yes   Substance Abuse History in the last 12 months:  No.  Consequences of Substance Abuse: NA  Past Medical History:  Past Medical History:  Diagnosis Date   Anxiety    Arthritis    DDD (degenerative disc disease), cervical    Depression    H/O adenoidectomy    History of removal of ovarian cyst    Hyperlipidemia     Past Surgical History:  Procedure Laterality Date   APPENDECTOMY      Family Psychiatric History:   Family History:  Family History  Problem Relation Age of Onset   Alcohol abuse Father    Alcohol abuse Maternal Uncle    Alcohol abuse Paternal Uncle     Social History:   Social History   Socioeconomic History   Marital status: Single    Spouse name: Not on file   Number of children: 0   Years of education: Not on file   Highest education level: Not on file  Occupational History   Not on file  Tobacco Use   Smoking status: Former   Smokeless tobacco: Former  Building services engineer Use: Never used  Substance and Sexual Activity   Alcohol use: Never   Drug use: Never   Sexual activity: Not on file  Other Topics Concern   Not on file  Social History Narrative   Not on file   Social Determinants of Health   Financial Resource Strain: Not on file  Food Insecurity: Not on file  Transportation Needs: Not on file  Physical Activity: Not on file  Stress: Not  on file  Social Connections: Not on file    Additional Social History:   Allergies:   Allergies  Allergen Reactions   Codeine Shortness Of Breath   Penicillins Rash    Metabolic Disorder Labs: No results found for: HGBA1C, MPG No results found for: PROLACTIN No results found for: CHOL, TRIG, HDL, CHOLHDL, VLDL, LDLCALC No results found for: TSH  Therapeutic Level Labs: No results found for:  LITHIUM No results found for: CBMZ No results found for: VALPROATE  Current Medications: Current Outpatient Medications  Medication Sig Dispense Refill   ARIPiprazole (ABILIFY) 20 MG tablet Take 1 tablet (20 mg total) by mouth daily. 30 tablet 2   cephALEXin (KEFLEX) 500 MG capsule Take 500 mg by mouth 4 (four) times daily. (Patient not taking: No sig reported)     clindamycin (CLEOCIN) 150 MG capsule Take by mouth 3 (three) times daily. (Patient not taking: No sig reported)     cyclobenzaprine (FLEXERIL) 5 MG tablet Take 5 mg by mouth 3 (three) times daily as needed for muscle spasms.     gabapentin (NEURONTIN) 300 MG capsule Take 1 capsule (300 mg total) by mouth 3 (three) times daily. 90 capsule 2   HYDROcodone-acetaminophen (NORCO) 10-325 MG per tablet Take 1 tablet by mouth every 6 (six) hours as needed. (Patient not taking: No sig reported)     hydrOXYzine (ATARAX) 25 MG tablet Take 1 tablet (25 mg total) by mouth 3 (three) times daily as needed. 90 tablet 2   LORazepam (ATIVAN) 1 MG tablet Take 1 mg by mouth daily.     Probiotic Product (PROBIOTIC DAILY PO) Take by mouth. (Patient not taking: No sig reported)     rosuvastatin (CRESTOR) 20 MG tablet Take 20 mg by mouth daily. (Patient not taking: No sig reported)     sertraline (ZOLOFT) 50 MG tablet Take 3 tablets (150 mg total) by mouth daily. 90 tablet 2   No current facility-administered medications for this visit.    Musculoskeletal: Strength & Muscle Tone: within normal limits Gait & Station: normal Patient leans: N/A  Psychiatric Specialty Exam: Review of Systems  There were no vitals taken for this visit.There is no height or weight on file to calculate BMI.  General Appearance: Casual  Eye Contact:  Good  Speech:  Clear and Coherent  Volume:  Normal  Mood:  Anxious and Depressed  Affect:  Congruent  Thought Process:  Coherent  Orientation:  Full (Time, Place, and Person)  Thought Content:  Logical  Suicidal  Thoughts:  No  Homicidal Thoughts:  No  Memory:  Immediate;   Good Recent;   Good  Judgement:  Good  Insight:  Good  Psychomotor Activity:  Normal  Concentration:  Concentration: Good  Recall:  Good  Fund of Knowledge:Good  Language: Good  Akathisia:  No  Handed:  Right  AIMS (if indicated):  done  Assets:  Communication Skills Desire for Improvement Resilience Social Support  ADL's:  Intact  Cognition: WNL  Sleep:  Good   Screenings: GAD-7    Advertising copywriter from 02/23/2022 in [facility] Video Visit from 12/11/2021 in [facility] Video Visit from 09/24/2021 in [facility] Video Visit from 07/02/2021 in [facility] Video Visit from 04/01/2021 in [facility]  Total GAD-7 Score 21 15 11 16 15       PHQ2-9    Flowsheet Row Counselor from 04/03/2022 in [place]  [facility] Counselor [facility]  PHQ-2 Total Score 5 6 4 4 6   PHQ-9 Total Score 19 27 15 13 24       Flowsheet Row Counselor [facility] Counselor [facility]  C-SSRS RISK CATEGORY Error: Q3, 4, or 5 should not be populated when Q2 is No Error: Q3, 4, or 5 should not be populated when Q2 is No Error: Q7 should not be populated when Q6 is No       Assessment and Plan:  [facility] Continue medications as indicated  Collaboration of Care: Medication Management AEB Abilify and  Zoloft   Patient/Guardian was advised Release of Information must be obtained prior to any record release in order to collaborate their care with an outside provider. Patient/Guardian was advised if they have not already done so to contact the registration department to sign all necessary forms in order for us to release information regarding their care.   Consent: Patient/Guardian gives verbal consent for treatment and assignment of benefits for services provided during this visit. Patient/Guardian expressed understanding and agreed to proceed.   Oneta Rackanika N Kaydi Kley, NP 5/23/202311:19 AM

## 2022-04-08 ENCOUNTER — Ambulatory Visit (INDEPENDENT_AMBULATORY_CARE_PROVIDER_SITE_OTHER): Payer: No Payment, Other | Admitting: Licensed Clinical Social Worker

## 2022-04-08 DIAGNOSIS — F411 Generalized anxiety disorder: Secondary | ICD-10-CM

## 2022-04-08 DIAGNOSIS — F332 Major depressive disorder, recurrent severe without psychotic features: Secondary | ICD-10-CM | POA: Diagnosis not present

## 2022-04-09 ENCOUNTER — Ambulatory Visit (HOSPITAL_COMMUNITY): Payer: Commercial Managed Care - PPO | Admitting: Licensed Clinical Social Worker

## 2022-04-09 ENCOUNTER — Telehealth (HOSPITAL_COMMUNITY): Payer: Self-pay | Admitting: Licensed Clinical Social Worker

## 2022-04-09 DIAGNOSIS — F411 Generalized anxiety disorder: Secondary | ICD-10-CM

## 2022-04-09 DIAGNOSIS — F332 Major depressive disorder, recurrent severe without psychotic features: Secondary | ICD-10-CM

## 2022-04-09 NOTE — Progress Notes (Signed)
Late entry: Spoke with patient via Webex video call, used 2 identifiers to correctly identify patient. States he did PHP in the past about 1 year ago. Her therapist recommended for depression/anxiety. Has not been working states she has a lot of social anxiety and is trying to get disability. She was unable to stay at her last  couple jobs due to the anxiety. Has arthritis in her feet as well that makes it difficult to stand for long periods. Several other medical conditions such as DDD and osteoarthritis. Worried about her future. On scale 1-10 as 10 being worst she rates depression at 6 and anxiety at 8. Denies SI/HI or AV hallucinations. PHQ9=17. No issues or complaints.

## 2022-04-10 ENCOUNTER — Ambulatory Visit (INDEPENDENT_AMBULATORY_CARE_PROVIDER_SITE_OTHER): Payer: No Payment, Other | Admitting: Licensed Clinical Social Worker

## 2022-04-10 DIAGNOSIS — F411 Generalized anxiety disorder: Secondary | ICD-10-CM

## 2022-04-10 DIAGNOSIS — F332 Major depressive disorder, recurrent severe without psychotic features: Secondary | ICD-10-CM | POA: Diagnosis not present

## 2022-04-10 DIAGNOSIS — F4310 Post-traumatic stress disorder, unspecified: Secondary | ICD-10-CM

## 2022-04-14 ENCOUNTER — Ambulatory Visit (HOSPITAL_COMMUNITY): Payer: Commercial Managed Care - PPO

## 2022-04-14 NOTE — Progress Notes (Deleted)
Virtual Visit via Video Note  I connected with Connie Cook on 04/14/22 at  9:00 AM EDT by a video enabled telemedicine application and verified that I am speaking with the correct person using two identifiers.  Location: Patient: Home Provider: Office   I discussed the limitations of evaluation and management by telemedicine and the availability of in person appointments. The patient expressed understanding and agreed to proceed.    I discussed the assessment and treatment plan with the patient. The patient was provided an opportunity to ask questions and all were answered. The patient agreed with the plan and demonstrated an understanding of the instructions.   The patient was advised to call back or seek an in-person evaluation if the symptoms worsen or if the condition fails to improve as anticipated.  I provided 15 minutes of non-face-to-face time during this encounter.   [name], NP   [facility]/NP OP Progress Note  04/14/2022 11:27 AM Tarita Pietro CassisBridgette Mitcham  MRN:  [id number]  Chief Complaint: Connie Cook reported " I had a good weekend, I dog sat for a friend"  HPI: [place] " Connie Cook."  Was seen and evaluated via WebEx.  She is awake, alert and oriented x3.  She presents with a bright and pleasant affect.  Denying suicidal or homicidal ideations.  Denies auditory visual hallucinations.    States she is feeling like she is in a "good space".  States she has been learning a lot since attending group setting.  States she continues to work on setting boundaries but overall feels like she is in a good space.  Continues to report over eating and states her sleep has improved with gabapentin.  Denied any other safety concerns.  Patient to continue partial hospitalization programming.  Support,  encouragement  and reassurance was provided.   Visit Diagnosis:    ICD-10-CM   1. Severe episode of recurrent major depressive disorder, without psychotic features (HCC)  F33.2      2. Generalized anxiety disorder  F41.1       Past Psychiatric History:   Past Medical History:  Past Medical History:  Diagnosis Date   Anxiety    Arthritis    DDD (degenerative disc disease), cervical    Depression    H/O adenoidectomy    History of removal of ovarian cyst    Hyperlipidemia     Past Surgical History:  Procedure Laterality Date   APPENDECTOMY      Family Psychiatric History:   Family History:  Family History  Problem Relation Age of Onset   Depression Mother    Depression Father    Alcohol abuse Father    Alcohol abuse Maternal Uncle    Depression Paternal Uncle    Alcohol abuse Paternal Uncle    Depression Cousin     Social History:  Social History   Socioeconomic History   Marital status: Single    Spouse name: Not on file   Number of children: 0   Years of education: Not on file   Highest education level: Some college, no degree  Occupational History   Not on file  Tobacco Use   Smoking status: Former   Smokeless tobacco: Former  Building services engineerVaping Use   Vaping Use: Never used  Substance and Sexual Activity   Alcohol use: Never   Drug use: Never   Sexual activity: Not on file  Other Topics Concern   Not on file  Social History Narrative   Not on file   Social  Determinants of Health   Financial Resource Strain: Not on file  Food Insecurity: Not on file  Transportation Needs: Not on file  Physical Activity: Not on file  Stress: Not on file  Social Connections: Not on file    Allergies:  Allergies  Allergen Reactions   Codeine Shortness Of Breath   Penicillins Rash   Tramadol Itching    Metabolic Disorder Labs: No results found for: HGBA1C, MPG No results found for: PROLACTIN No results found for: CHOL, TRIG, HDL, CHOLHDL, VLDL, LDLCALC No results found for: TSH  Therapeutic Level Labs: No results found for: LITHIUM No results found for: VALPROATE No components found for:  CBMZ  Current Medications: Current  Outpatient Medications  Medication Sig Dispense Refill   ARIPiprazole (ABILIFY) 20 MG tablet Take 1 tablet (20 mg total) by mouth daily. 30 tablet 2   cephALEXin (KEFLEX) 500 MG capsule Take 500 mg by mouth 4 (four) times daily. (Patient not taking: Reported on 11/29/2020)     clindamycin (CLEOCIN) 150 MG capsule Take by mouth 3 (three) times daily. (Patient not taking: Reported on 11/29/2020)     cyclobenzaprine (FLEXERIL) 5 MG tablet Take 5 mg by mouth 3 (three) times daily as needed for muscle spasms. (Patient not taking: Reported on 04/07/2022)     gabapentin (NEURONTIN) 300 MG capsule Take 1 capsule (300 mg total) by mouth 3 (three) times daily. 90 capsule 2   HYDROcodone-acetaminophen (NORCO) 10-325 MG per tablet Take 1 tablet by mouth every 6 (six) hours as needed. (Patient not taking: Reported on 11/29/2020)     hydrOXYzine (ATARAX) 25 MG tablet Take 1 tablet (25 mg total) by mouth 3 (three) times daily as needed. 90 tablet 2   LORazepam (ATIVAN) 1 MG tablet Take 1 mg by mouth daily.     Probiotic Product (PROBIOTIC DAILY PO) Take by mouth. (Patient not taking: Reported on 11/29/2020)     rosuvastatin (CRESTOR) 20 MG tablet Take 20 mg by mouth daily. (Patient not taking: Reported on 11/29/2020)     sertraline (ZOLOFT) 50 MG tablet Take 3 tablets (150 mg total) by mouth daily. 90 tablet 2   No current facility-administered medications for this visit.     Musculoskeletal: Strength & Muscle Tone: within normal limits Gait & Station: normal Patient leans: N/A  Psychiatric Specialty Exam: Review of Systems  HENT: Negative.    Cardiovascular: Negative.   Musculoskeletal: Negative.   Psychiatric/Behavioral:  Negative for agitation. The patient is nervous/anxious.   All other systems reviewed and are negative.  There were no vitals taken for this visit.There is no height or weight on file to calculate BMI.  General Appearance: Casual  Eye Contact:  Good  Speech:  Clear and Coherent   Volume:  Normal  Mood:  Anxious and Depressed  Affect:  Congruent  Thought Process:  Coherent  Orientation:  Full (Time, Place, and Person)  Thought Content: Logical   Suicidal Thoughts:  No  Homicidal Thoughts:  No  Memory:  Immediate;   Good Recent;   Good  Judgement:  Fair  Insight:  Good  Psychomotor Activity:  Normal  Concentration:  Concentration: Good  Recall:  Good  Fund of Knowledge: Good  Language: Good  Akathisia:  No  Handed:  Right  AIMS (if indicated): done  Assets:  Communication Skills Desire for Improvement Social Support  ADL's:  Intact  Cognition: WNL  Sleep:  Fair   Screenings: GAD-7    Advertising copywriter from 02/23/2022 in [place]  [facility] Video Visit from 12/11/2021 in [facility] Video Visit from 09/24/2021 in [facility] Video Visit from 07/02/2021 in [facility] Video Visit from 04/01/2021 in [facility]  Total GAD-7 Score 21 15 11 16 15       PHQ2-9    Flowsheet Row Counselor from 04/07/2022 in [facility] Counselor from 04/03/2022 in [facility] Counselor from 02/23/2022 in [facility] Video Visit from 12/11/2021 in [facility] Video Visit from 09/24/2021 in [facility]  PHQ-2 Total Score 4 5 6 4 4   PHQ-9 Total Score 17 19 27 15 13       Flowsheet Row Counselor from 04/03/2022 in [facility] Counselor from 02/23/2022 in [facility] Video Visit [facility]  C-SSRS RISK CATEGORY Error: Q3, 4, or 5 should not be populated when Q2 is No Error: Q3, 4, or 5 should not be populated when Q2 is No Error: Q7 should not be populated when Q6 is No        Assessment and Plan:   Continue partial hospitalization programming- [facility] Continue medications as indicated  Collaboration of Care: Collaboration of Care: Medication Management AEB Abilify, gabapentin, Zoloft and hydroxyzine  Patient/Guardian was advised Release of Information must be obtained prior to any record release in order to collaborate their care with an outside provider. Patient/Guardian was advised if they have not already done so to contact the registration department to sign all necessary forms in order for [facility] to release information regarding their care.   Consent: Patient/Guardian gives verbal consent for treatment and assignment of benefits for services provided during this visit. Patient/Guardian expressed understanding and agreed to proceed.    12/13/2021, NP 04/14/2022, 11:27 AM

## 2022-04-14 NOTE — Progress Notes (Deleted)
Spoke with patient via Webex video call, used 2 identifiers to correctly identify patient. States she is enjoying groups. No issues with medications but she is going to discuss Rexulti with her psychiatrist at her next appointment on June 7th. She is thinking that maybe Abilify is causing weight gain and would like her opinion on a different medication. Denies SI/HI or AV hallucinations. On scale 1-10 as 10 being worst she rates depression at 3 and anxiety at 4. No issues or complaints.

## 2022-04-15 ENCOUNTER — Ambulatory Visit (HOSPITAL_COMMUNITY): Payer: Commercial Managed Care - PPO

## 2022-04-16 ENCOUNTER — Ambulatory Visit (INDEPENDENT_AMBULATORY_CARE_PROVIDER_SITE_OTHER): Payer: No Payment, Other | Admitting: Licensed Clinical Social Worker

## 2022-04-16 DIAGNOSIS — F332 Major depressive disorder, recurrent severe without psychotic features: Secondary | ICD-10-CM | POA: Diagnosis not present

## 2022-04-16 DIAGNOSIS — F411 Generalized anxiety disorder: Secondary | ICD-10-CM

## 2022-04-17 ENCOUNTER — Ambulatory Visit (INDEPENDENT_AMBULATORY_CARE_PROVIDER_SITE_OTHER): Payer: No Payment, Other | Admitting: Licensed Clinical Social Worker

## 2022-04-17 DIAGNOSIS — F332 Major depressive disorder, recurrent severe without psychotic features: Secondary | ICD-10-CM | POA: Diagnosis not present

## 2022-04-17 DIAGNOSIS — F411 Generalized anxiety disorder: Secondary | ICD-10-CM

## 2022-04-20 ENCOUNTER — Ambulatory Visit (INDEPENDENT_AMBULATORY_CARE_PROVIDER_SITE_OTHER): Payer: No Payment, Other | Admitting: Licensed Clinical Social Worker

## 2022-04-20 DIAGNOSIS — F332 Major depressive disorder, recurrent severe without psychotic features: Secondary | ICD-10-CM | POA: Diagnosis not present

## 2022-04-20 DIAGNOSIS — F411 Generalized anxiety disorder: Secondary | ICD-10-CM

## 2022-04-20 NOTE — Progress Notes (Signed)
Spoke with patient via Webex video call, used 2 identifiers to correctly identify patient. States that groups are going good, she enjoys it and hearing everyone's opinions. Gabapentin makes her sleepy with some blurry vision but she does not want to change her medications. She will continue to monitor the side effects but will not drive while taking it until it improves. She goes to visit her psychiatrist next week and will discuss it with her then. On scale 1-10 as 10 being worst she rates depression at 2 and anxiety at 3. Denies SI/HI or AV hallucinations. PHQ9=12 No issues or complaints.

## 2022-04-21 ENCOUNTER — Ambulatory Visit (INDEPENDENT_AMBULATORY_CARE_PROVIDER_SITE_OTHER): Payer: No Payment, Other | Admitting: Licensed Clinical Social Worker

## 2022-04-21 DIAGNOSIS — F411 Generalized anxiety disorder: Secondary | ICD-10-CM

## 2022-04-21 DIAGNOSIS — F332 Major depressive disorder, recurrent severe without psychotic features: Secondary | ICD-10-CM | POA: Diagnosis not present

## 2022-04-22 ENCOUNTER — Ambulatory Visit (INDEPENDENT_AMBULATORY_CARE_PROVIDER_SITE_OTHER): Payer: No Payment, Other | Admitting: Licensed Clinical Social Worker

## 2022-04-22 DIAGNOSIS — F332 Major depressive disorder, recurrent severe without psychotic features: Secondary | ICD-10-CM

## 2022-04-22 DIAGNOSIS — F411 Generalized anxiety disorder: Secondary | ICD-10-CM

## 2022-04-23 ENCOUNTER — Ambulatory Visit (INDEPENDENT_AMBULATORY_CARE_PROVIDER_SITE_OTHER): Payer: No Payment, Other | Admitting: Licensed Clinical Social Worker

## 2022-04-23 DIAGNOSIS — F411 Generalized anxiety disorder: Secondary | ICD-10-CM

## 2022-04-23 DIAGNOSIS — F332 Major depressive disorder, recurrent severe without psychotic features: Secondary | ICD-10-CM

## 2022-04-24 ENCOUNTER — Ambulatory Visit (INDEPENDENT_AMBULATORY_CARE_PROVIDER_SITE_OTHER): Payer: No Payment, Other | Admitting: Licensed Clinical Social Worker

## 2022-04-24 ENCOUNTER — Encounter (HOSPITAL_COMMUNITY): Payer: Self-pay | Admitting: Licensed Clinical Social Worker

## 2022-04-24 DIAGNOSIS — F332 Major depressive disorder, recurrent severe without psychotic features: Secondary | ICD-10-CM

## 2022-04-24 DIAGNOSIS — F4310 Post-traumatic stress disorder, unspecified: Secondary | ICD-10-CM

## 2022-04-24 DIAGNOSIS — F411 Generalized anxiety disorder: Secondary | ICD-10-CM

## 2022-04-24 NOTE — Progress Notes (Signed)
Virtual Visit via Video Note  I connected with Connie Cook on 04/07/22 at  9:00 AM EDT by a video enabled telemedicine application and verified that I am speaking with the correct person using two identifiers.  Location: Patient: Home Provider: Office   I discussed the limitations of evaluation and management by telemedicine and the availability of in person appointments. The patient expressed understanding and agreed to proceed.  I discussed the assessment and treatment plan with the patient. The patient was provided an opportunity to ask questions and all were answered. The patient agreed with the plan and demonstrated an understanding of the instructions.   The patient was advised to call back or seek an in-person evaluation if the symptoms worsen or if the condition fails to improve as anticipated.  I provided 15 minutes of non-face-to-face time during this encounter.   Connie Rackanika N Bathsheba Durrett, NP    Psychiatric Initial Adult Assessment   Patient Identification: Connie Cook MRN:  [id number] Date of Evaluation:  305/23/2023 Referral Source: C. Hooker Chief Complaint:  Visit Diagnosis:    ICD-10-CM   1. Severe episode of recurrent major depressive disorder, without psychotic features (HCC)  F33.2     2. Generalized anxiety disorder  F41.1     3. PTSD (post-traumatic stress disorder)  F43.10       History of Present Illness:  Connie Cook is a 61 year old Caucasian female who presents due to worsening depression and anxiety.  Reports stressors related to financial and ongoing medical issues..  States she was recently denied for disability which is making her anxiety "skyrocket".  She has not been able to work due to chronic pain issues.  Currently she is denying suicidal or homicidal ideations.  Denies auditory visual hallucinations.    Reports she is currently followed by therapy and psychiatry Dr. Karen KitchensBrittany Parson nurse practitioner and therapist  Ardith Darklaudia Hooker LCSW.  She was prescribed Zoloft, hydroxyzine Abilify and trazodone which she reports tolerating well.  Denied previous inpatient admissions, reported history with suicidal ideations.  Patient previous attended partial hospitalization programming and intensive outpatient programming.  Denied illicit drug use or substance abuse history.  Denied previous inpatient admissions.  Associated Signs/Symptoms: Depression Symptoms:  depressed mood, feelings of worthlessness/guilt, difficulty concentrating, anxiety, (Hypo) Manic Symptoms:  Impulsivity, Irritable Mood, Anxiety Symptoms:  Excessive Worry, Psychotic Symptoms:  Hallucinations: None PTSD Symptoms: NA  Past Psychiatric History:   Previous Psychotropic Medications: No   Substance Abuse History in the last 12 months:  No.  Consequences of Substance Abuse: NA  Past Medical History:  Past Medical History:  Diagnosis Date   Anxiety    Arthritis    DDD (degenerative disc disease), cervical    Depression    H/O adenoidectomy    History of removal of ovarian cyst    Hyperlipidemia     Past Surgical History:  Procedure Laterality Date   APPENDECTOMY      Family Psychiatric History:   Family History:  Family History  Problem Relation Age of Onset   Depression Mother    Depression Father    Alcohol abuse Father    Alcohol abuse Maternal Uncle    Depression Paternal Uncle    Alcohol abuse Paternal Uncle    Depression Cousin     Social History:   Social History   Socioeconomic History   Marital status: Single    Spouse name: Not on file   Number of children: 0   Years of education: Not on file  Highest education level: Some college, no degree  Occupational History   Not on file  Tobacco Use   Smoking status: Former   Smokeless tobacco: Former  Building services engineer Use: Never used  Substance and Sexual Activity   Alcohol use: Never   Drug use: Never   Sexual activity: Not on file  Other Topics  Concern   Not on file  Social History Narrative   Not on file   Social Determinants of Health   Financial Resource Strain: Not on file  Food Insecurity: Not on file  Transportation Needs: Not on file  Physical Activity: Not on file  Stress: Not on file  Social Connections: Not on file    Additional Social History:  Allergies:   Allergies  Allergen Reactions   Codeine Shortness Of Breath   Penicillins Rash   Tramadol Itching    Metabolic Disorder Labs: No results found for: "HGBA1C", "MPG" No results found for: "PROLACTIN" No results found for: "CHOL", "TRIG", "HDL", "CHOLHDL", "VLDL", "LDLCALC" No results found for: "TSH"  Therapeutic Level Labs: No results found for: "LITHIUM" No results found for: "CBMZ" No results found for: "VALPROATE"  Current Medications: Current Outpatient Medications  Medication Sig Dispense Refill   ARIPiprazole (ABILIFY) 20 MG tablet Take 1 tablet (20 mg total) by mouth daily. 30 tablet 2   cephALEXin (KEFLEX) 500 MG capsule Take 500 mg by mouth 4 (four) times daily. (Patient not taking: Reported on 11/29/2020)     clindamycin (CLEOCIN) 150 MG capsule Take by mouth 3 (three) times daily. (Patient not taking: Reported on 11/29/2020)     cyclobenzaprine (FLEXERIL) 5 MG tablet Take 5 mg by mouth 3 (three) times daily as needed for muscle spasms. (Patient not taking: Reported on 04/07/2022)     gabapentin (NEURONTIN) 300 MG capsule Take 1 capsule (300 mg total) by mouth 3 (three) times daily. 90 capsule 2   HYDROcodone-acetaminophen (NORCO) 10-325 MG per tablet Take 1 tablet by mouth every 6 (six) hours as needed. (Patient not taking: Reported on 11/29/2020)     hydrOXYzine (ATARAX) 25 MG tablet Take 1 tablet (25 mg total) by mouth 3 (three) times daily as needed. 90 tablet 2   LORazepam (ATIVAN) 1 MG tablet Take 1 mg by mouth daily.     Probiotic Product (PROBIOTIC DAILY PO) Take by mouth. (Patient not taking: Reported on 11/29/2020)     rosuvastatin  (CRESTOR) 20 MG tablet Take 20 mg by mouth daily. (Patient not taking: Reported on 11/29/2020)     sertraline (ZOLOFT) 50 MG tablet Take 3 tablets (150 mg total) by mouth daily. 90 tablet 2   No current facility-administered medications for this visit.    Musculoskeletal:   Psychiatric Specialty Exam: Review of Systems  Psychiatric/Behavioral:  Positive for decreased concentration. The patient is nervous/anxious.   All other systems reviewed and are negative.   There were no vitals taken for this visit.There is no height or weight on file to calculate BMI.  General Appearance: Casual  Eye Contact:  Good  Speech:  Clear and Coherent  Volume:  Normal  Mood:  Anxious and Depressed  Affect:  Congruent  Thought Process:  Coherent  Orientation:  Full (Time, Place, and Person)  Thought Content:  Logical  Suicidal Thoughts:  No  Homicidal Thoughts:  No  Memory:  Immediate;   Good Recent;   Good  Judgement:  Good  Insight:  Good  Psychomotor Activity:  Normal  Concentration:  Concentration: Fair  Recall:  [place] Major of Knowledge:Good  Language: Good  Akathisia:  No  Handed:  Right  AIMS (if indicated):  done  Assets:  Communication Skills Desire for Improvement Resilience Social Support  ADL's:  Intact  Cognition: WNL  Sleep:  Good   Screenings: GAD-7    Advertising copywriter from 02/23/2022 in [facility] Video Visit from 12/11/2021 in [facility] Video Visit from 09/24/2021 in [facility] Video Visit from 07/02/2021 in [facility] Video Visit from 04/01/2021 in [facility]  Total GAD-7 Score 21 15 11 16 15       PHQ2-9    Flowsheet Row Counselor from 04/07/2022 in [facility] Counselor from 04/03/2022 in [facility] Counselor from 02/23/2022 in [facility] Video Visit from 12/11/2021 in [facility] Video Visit from 09/24/2021 in [facility]  PHQ-2 Total Score 4 5 6 4 4   PHQ-9 Total Score 17 19 27 15 13       Flowsheet Row Counselor from 04/03/2022 in [facility] Counselor from 02/23/2022 in [facility] Video Visit from 12/11/2021 in [facility]  C-SSRS RISK CATEGORY Error: Q3, 4, or 5 should not be populated when Q2 is No Error: Q3, 4, or 5 should not be populated when Q2 is No Error: Q7 should not be populated when Q6 is No       Assessment and Plan:  Patient to start partial hospitalization program Continue medications as indicated  Collaboration of Care: Other [facility]  Patient/Guardian was advised Release of Information must be obtained prior to any record release in order to collaborate their care with an outside provider. Patient/Guardian was advised if they have not already done so to contact the registration department to sign all necessary forms in order for 12/13/2021 to release information regarding their care.   Consent: Patient/Guardian gives verbal consent for treatment and assignment of benefits for services provided during this visit. Patient/Guardian expressed understanding and agreed to proceed.   [facility], NP 04/07/2022 1:09 PM

## 2022-04-24 NOTE — Progress Notes (Cosign Needed Addendum)
Virtual Visit via Video Note  I connected with [place] on 05/06/22 at  9:00 AM EDT by a video enabled telemedicine application and verified that I am speaking with the correct person using two identifiers.  Location: Patient: home Provider: Office   I discussed the limitations of evaluation and management by telemedicine and the availability of in person appointments. The patient expressed understanding and agreed to proceed.   I discussed the assessment and treatment plan with the patient. The patient was provided an opportunity to ask questions and all were answered. The patient agreed with the plan and demonstrated an understanding of the instructions.   The patient was advised to call back or seek an in-person evaluation if the symptoms worsen or if the condition fails to improve as anticipated.  I provided 15  minutes of non-face-to-face time during this encounter.   Derrill Center, NP    [facility] Intensive Outpatient Program Discharge Summary  Connie Cook [facility] DX:[id number]  Admission date:04/07/2022 Discharge date: 05/06/2022  Reason for admission: Connie Cook continue is a 61 year old Caucasian female who presents due to worsening depression and anxiety.  Reports stressors related to financial and medical.  States she was recently declined for disability which is making her anxiety skyrocket.  She denied suicidal or homicidal ideations.  Denies auditory visual hallucinations.  Reports she is currently followed by therapy and psychiatry Dr. Clearence Ped nurse practitioner and therapist Moses Manners LCSW.  Denied previous inpatient admissions.   Progress in Program Toward Treatment Goals: Progressing ongoing patient attended participated with daily group session active and engaged participation.  Denying suicidal or homicidal ideations.  Denies auditory visual hallucinations.  Declined any medication refill at discharge.  Support  encouragement reassurance was provided  Progress (rationale): Keep all outpatient follow-up appointments with Dr. Burt Ek nurse practitioner and [place] of Care: Psychiatrist AEB Fatima Blank  Patient/Guardian was advised Release of Information must be obtained prior to any record release in order to collaborate their care with an outside provider. Patient/Guardian was advised if they have not already done so to contact the registration department to sign all necessary forms in order for [place] to release information regarding their care.   Consent: Patient/Guardian gives verbal consent for treatment and assignment of benefits for services provided during this visit. Patient/Guardian expressed understanding and agreed to proceed.   Connie Ala, NP  04/24/2022

## 2022-04-27 ENCOUNTER — Ambulatory Visit (INDEPENDENT_AMBULATORY_CARE_PROVIDER_SITE_OTHER): Payer: No Payment, Other | Admitting: Licensed Clinical Social Worker

## 2022-04-27 DIAGNOSIS — F332 Major depressive disorder, recurrent severe without psychotic features: Secondary | ICD-10-CM | POA: Diagnosis not present

## 2022-04-27 DIAGNOSIS — F411 Generalized anxiety disorder: Secondary | ICD-10-CM

## 2022-04-27 NOTE — Progress Notes (Signed)
Spoke with patient via Webex video call, used 2 identifiers to correctly identify patient. States that groups are going well. Only taking her Neurontin once daily because it causes drowsiness but no longer having blurry vision. On scale 1-10 as 10 being worst she rates depression at 5 and anxiety at 7. Denies SI/HI or AV hallucinations. No issues or complaints.

## 2022-04-28 ENCOUNTER — Ambulatory Visit (INDEPENDENT_AMBULATORY_CARE_PROVIDER_SITE_OTHER): Payer: No Payment, Other | Admitting: Licensed Clinical Social Worker

## 2022-04-28 DIAGNOSIS — F411 Generalized anxiety disorder: Secondary | ICD-10-CM

## 2022-04-28 DIAGNOSIS — F332 Major depressive disorder, recurrent severe without psychotic features: Secondary | ICD-10-CM

## 2022-04-29 ENCOUNTER — Encounter (HOSPITAL_COMMUNITY): Payer: Self-pay | Admitting: Psychiatry

## 2022-04-29 ENCOUNTER — Telehealth (INDEPENDENT_AMBULATORY_CARE_PROVIDER_SITE_OTHER): Payer: No Payment, Other | Admitting: Psychiatry

## 2022-04-29 ENCOUNTER — Other Ambulatory Visit: Payer: Self-pay

## 2022-04-29 DIAGNOSIS — F411 Generalized anxiety disorder: Secondary | ICD-10-CM

## 2022-04-29 DIAGNOSIS — R635 Abnormal weight gain: Secondary | ICD-10-CM

## 2022-04-29 DIAGNOSIS — F32A Depression, unspecified: Secondary | ICD-10-CM | POA: Diagnosis not present

## 2022-04-29 MED ORDER — GABAPENTIN 300 MG PO CAPS
300.0000 mg | ORAL_CAPSULE | Freq: Three times a day (TID) | ORAL | 3 refills | Status: DC
  Filled 2022-04-29 – 2022-05-20 (×3): qty 90, 30d supply, fill #0

## 2022-04-29 MED ORDER — ARIPIPRAZOLE 20 MG PO TABS
20.0000 mg | ORAL_TABLET | Freq: Every day | ORAL | 3 refills | Status: DC
Start: 2022-04-29 — End: 2022-05-28
  Filled 2022-04-29 – 2022-05-18 (×3): qty 30, 30d supply, fill #0

## 2022-04-29 MED ORDER — HYDROXYZINE HCL 25 MG PO TABS
25.0000 mg | ORAL_TABLET | Freq: Three times a day (TID) | ORAL | 3 refills | Status: DC | PRN
  Filled 2022-04-29 – 2022-05-18 (×2): qty 90, 30d supply, fill #0

## 2022-04-29 MED ORDER — SERTRALINE HCL 50 MG PO TABS
150.0000 mg | ORAL_TABLET | Freq: Every day | ORAL | 3 refills | Status: DC
  Filled 2022-04-29 – 2022-05-18 (×3): qty 90, 30d supply, fill #0

## 2022-04-29 NOTE — Progress Notes (Signed)
BH MD/PA/NP OP Progress Note Virtual Visit via Video Note  I connected with Connie Cook on 04/29/22 at  9:00 AM EDT by a video enabled telemedicine application and verified that I am speaking with the correct person using two identifiers.  Location: Patient: Home Provider: Clinic   I discussed the limitations of evaluation and management by telemedicine and the availability of in person appointments. The patient expressed understanding and agreed to proceed.  I provided 30 minutes of non-face-to-face time during this encounter.    04/29/2022 9:20 AM Connie Cook  MRN:  [id number]  Chief Complaint: "I am concerned about my weight"  HPI: 61 year old female seen today for follow up psychiatric evaluation. She has a psychiatric history of SI, depression, and anxiety. She is currently managed on Zoloft 150 mg daily, hydroxyzine 10 mg 3 times daily, gabapentin 300 mg 3 times daily and Abilify 15 mg daily. She notes that her  medications are effective in managing her psychiatric conditions.  Today she was well-groomed, pleasant, cooperative, engaged in conversation, and maintained eye contact.  She informed Clinical research associate that mentally she feels stable but notes that she is concerned about her weight.  She informed Clinical research associate that her weight limits her from doing activities that she once enjoyed.  She asked writer if there was a medication she could take to help manage her weight.  Provider informed patient that they were medications however informed her that they cannot be prescribed by this writer.  She informed Clinical research associate that she continues to gain weight and notes that since her last visit she has gained 10 pounds.  Provider encouraged patient to follow-up with her PCP to discuss these options.  Patient referred to nutrition and diabetic services.    Mentally patient notes that she is able to cope.  She reports that she has been going to group therapy regularly and finds it effective in  helping her manage her anxiety and depression.  Provider conducted a GAD-7 and patient scored an 8.  Provider also conducted PHQ-9 and patient scored a 15.  She notes that her sleep fluctuates reporting that at times she sleeps 4 hours and at other times she sleeps soundly with the help of gabapentin.  Today she denies SI/HI/VAH, mania, paranoia.  Patient notes that she has pain in her shoulders, back, and knees.  She notes that the gabapentin helps manage her pain.  Today no medication changes made.  Patient referred to diabetic and nutritionist services for weight gain.  Patient reports that she will discuss with her primary care doctor potentially starting medication to help manage her weight.  Provider also instructed patient to discuss metabolic syndrome with her PCP.  She endorsed understanding and agreed.  She will follow-up with outpatient counseling for therapy.  No other concerns at this time    Visit Diagnosis:    ICD-10-CM   1. Weight gain  R63.5 Ambulatory referral to Nutrition and Diabetic Education    2. Mild depression  F32.A ARIPiprazole (ABILIFY) 20 MG tablet    sertraline (ZOLOFT) 50 MG tablet    3. Generalized anxiety disorder  F41.1 hydrOXYzine (ATARAX) 25 MG tablet    gabapentin (NEURONTIN) 300 MG capsule      Past Psychiatric History:  SI, depression, and anxiety  Past Medical History:  Past Medical History:  Diagnosis Date   Anxiety    Arthritis    DDD (degenerative disc disease), cervical    Depression    H/O adenoidectomy  History of removal of ovarian cyst    Hyperlipidemia     Past Surgical History:  Procedure Laterality Date   APPENDECTOMY      Family Psychiatric History: Father Alcohol use, paternal aunt/uncle alcohol use   Family History:  Family History  Problem Relation Age of Onset   Depression Mother    Depression Father    Alcohol abuse Father    Alcohol abuse Maternal Uncle    Depression Paternal Uncle    Alcohol abuse Paternal  Uncle    Depression Cousin     Social History:  Social History   Socioeconomic History   Marital status: Single    Spouse name: Not on file   Number of children: 0   Years of education: Not on file   Highest education level: Some college, no degree  Occupational History   Not on file  Tobacco Use   Smoking status: Former   Smokeless tobacco: Former  Building services engineerVaping Use   Vaping Use: Never used  Substance and Sexual Activity   Alcohol use: Never   Drug use: Never   Sexual activity: Not on file  Other Topics Concern   Not on file  Social History Narrative   Not on file   Social Determinants of Health   Financial Resource Strain: Not on file  Food Insecurity: Not on file  Transportation Needs: Not on file  Physical Activity: Not on file  Stress: Not on file  Social Connections: Not on file    Allergies:  Allergies  Allergen Reactions   Codeine Shortness Of Breath   Penicillins Rash   Tramadol Itching    Metabolic Disorder Labs: No results found for: "HGBA1C", "MPG" No results found for: "PROLACTIN" No results found for: "CHOL", "TRIG", "HDL", "CHOLHDL", "VLDL", "LDLCALC" No results found for: "TSH"  Therapeutic Level Labs: No results found for: "LITHIUM" No results found for: "VALPROATE" No results found for: "CBMZ"  Current Medications: Current Outpatient Medications  Medication Sig Dispense Refill   ARIPiprazole (ABILIFY) 20 MG tablet Take 1 tablet (20 mg total) by mouth once daily. 30 tablet 3   cephALEXin (KEFLEX) 500 MG capsule Take 500 mg by mouth 4 (four) times daily. (Patient not taking: Reported on 11/29/2020)     clindamycin (CLEOCIN) 150 MG capsule Take by mouth 3 (three) times daily. (Patient not taking: Reported on 11/29/2020)     cyclobenzaprine (FLEXERIL) 5 MG tablet Take 5 mg by mouth 3 (three) times daily as needed for muscle spasms. (Patient not taking: Reported on 04/07/2022)     gabapentin (NEURONTIN) 300 MG capsule Take 1 capsule (300 mg total)  by mouth 3 (three) times daily. 90 capsule 3   HYDROcodone-acetaminophen (NORCO) 10-325 MG per tablet Take 1 tablet by mouth every 6 (six) hours as needed. (Patient not taking: Reported on 11/29/2020)     hydrOXYzine (ATARAX) 25 MG tablet Take 1 tablet (25 mg total) by mouth 3 (three) times daily as needed. 90 tablet 3   LORazepam (ATIVAN) 1 MG tablet Take 1 mg by mouth daily.     Probiotic Product (PROBIOTIC DAILY PO) Take by mouth. (Patient not taking: Reported on 11/29/2020)     rosuvastatin (CRESTOR) 20 MG tablet Take 20 mg by mouth daily. (Patient not taking: Reported on 11/29/2020)     sertraline (ZOLOFT) 50 MG tablet Take 3 tablets (150 mg total) by mouth daily. 90 tablet 3   No current facility-administered medications for this visit.     Musculoskeletal: Strength & Muscle Tone:  decreased and telehealth visit Gait & Station: normal, patient reports that weight limits her gait Patient leans: N/A  Psychiatric Specialty Exam: Review of Systems  There were no vitals taken for this visit.There is no height or weight on file to calculate BMI.  General Appearance: Well Groomed  Eye Contact:  Good  Speech:  Clear and Coherent and Slow  Volume:  Normal  Mood:  Depressed and reports she is able to cope  Affect:  Appropriate and Congruent  Thought Process:  Coherent, Goal Directed and Linear  Orientation:  Full (Time, Place, and Person)  Thought Content: WDL and Logical   Suicidal Thoughts:  No  Homicidal Thoughts:  No  Memory:  Immediate;   Good Recent;   Good Remote;   Good  Judgement:  Good  Insight:  Good  Psychomotor Activity:  Decreased and reports weight limits her activity  Concentration:  Concentration: Good and Attention Span: Good  Recall:  Good  Fund of Knowledge: Good  Language: Good  Akathisia:  No  Handed:  Right  AIMS (if indicated): Not done  Assets:  Communication Skills Desire for Improvement Financial Resources/Insurance Housing Social Support  ADL's:   Intact  Cognition: WNL  Sleep:  Fair   Screenings: GAD-7    Flowsheet Row Video Visit from 04/29/2022 in [facility] Counselor from 02/23/2022 in [facility] Video Visit from 12/11/2021 in [facility] Video Visit from 09/24/2021 in [facility] Video Visit from 07/02/2021 in [facility]  Total GAD-7 Score 8 21 15 11 16       PHQ2-9    Flowsheet Row Video Visit from 04/29/2022 in [facility] Counselor from 04/20/2022 in [facility] Counselor from 04/07/2022 in [facility] Counselor from 04/03/2022 in [facility] Counselor from 02/23/2022 in [facility]  PHQ-2 Total Score 5 0 4 5 6   PHQ-9 Total Score 15 12 17 19 27       Flowsheet Row Counselor from 04/03/2022 in [facility] Counselor from 02/23/2022 in [facility] Video Visit from 12/11/2021 in [facility]  C-SSRS RISK CATEGORY Error: Q3, 4, or 5 should not be populated when Q2 is No Error: Q3, 4, or 5 should not be populated when Q2 is No Error: Q7 should not be populated when Q6 is No        Assessment and Plan: Patient reports that she  is able to cope with her anxiety and depression.  She does note that she is stressed over her weight.  Today no medication changes made.  Patient referred to diabetic and nutritionist services for weight gain.  Patient reports that she will discuss with her primary care doctor potentially starting medication to help manage her weight.  Provider also instructed patient to discuss metabolic syndrome with her PCP.  She endorsed understanding and agreed.    1. Mild depression  Continue- ARIPiprazole (ABILIFY) 20 MG tablet; Take 1 tablet (20 mg total)  by mouth once daily.  Dispense: 30 tablet; Refill: 3 Continue- sertraline (ZOLOFT) 50 MG tablet; Take 3 tablets (150 mg total) by mouth once daily.  Dispense: 90 tablet; Refill: 3  2. Generalized anxiety disorder  Continue- hydrOXYzine (ATARAX) 25 MG tablet; Take 1 tablet (25 mg total) by mouth 3 (three) times daily as needed.  Dispense: 90 tablet;  Refill: 3 Continue- gabapentin (NEURONTIN) 300 MG capsule; Take 1 capsule (300 mg total) by mouth 3 (three) times daily.  Dispense: 90 capsule; Refill: 3  3. Weight gain  - Ambulatory referral to Nutrition and Diabetic Education    Follow-up in 3 months Follow-up with therapy   Shanna Cisco, NP 04/29/2022, 9:20 AM

## 2022-04-30 ENCOUNTER — Ambulatory Visit (INDEPENDENT_AMBULATORY_CARE_PROVIDER_SITE_OTHER): Payer: No Payment, Other | Admitting: Licensed Clinical Social Worker

## 2022-04-30 DIAGNOSIS — F411 Generalized anxiety disorder: Secondary | ICD-10-CM

## 2022-04-30 DIAGNOSIS — F332 Major depressive disorder, recurrent severe without psychotic features: Secondary | ICD-10-CM

## 2022-05-04 ENCOUNTER — Ambulatory Visit (INDEPENDENT_AMBULATORY_CARE_PROVIDER_SITE_OTHER): Payer: No Payment, Other | Admitting: Professional

## 2022-05-04 DIAGNOSIS — F332 Major depressive disorder, recurrent severe without psychotic features: Secondary | ICD-10-CM

## 2022-05-04 DIAGNOSIS — F411 Generalized anxiety disorder: Secondary | ICD-10-CM

## 2022-05-05 ENCOUNTER — Ambulatory Visit (INDEPENDENT_AMBULATORY_CARE_PROVIDER_SITE_OTHER): Payer: No Payment, Other | Admitting: Professional

## 2022-05-05 DIAGNOSIS — F411 Generalized anxiety disorder: Secondary | ICD-10-CM

## 2022-05-05 DIAGNOSIS — F332 Major depressive disorder, recurrent severe without psychotic features: Secondary | ICD-10-CM

## 2022-05-05 NOTE — Progress Notes (Signed)
Spoke with patient via webex video call, used 2 identifiers to correctly identify patient. States that groups are going great. No issues or complaints. Her sister got a new puppy so this weekend she enjoyed playing with it. Tomorrow is her last day in [facility]. She is not sure yet what her plan is after discharge. On scale 1-10 as 10 being worst she rates depression at 3 and anxiety at 5. Denies SI/HI or AV hallucinations. PHQ9=16. No side effects from medications.

## 2022-05-06 ENCOUNTER — Telehealth (HOSPITAL_COMMUNITY): Payer: Self-pay | Admitting: Professional

## 2022-05-06 ENCOUNTER — Other Ambulatory Visit: Payer: Self-pay

## 2022-05-06 ENCOUNTER — Ambulatory Visit (INDEPENDENT_AMBULATORY_CARE_PROVIDER_SITE_OTHER): Payer: No Payment, Other | Admitting: Professional

## 2022-05-06 DIAGNOSIS — F332 Major depressive disorder, recurrent severe without psychotic features: Secondary | ICD-10-CM

## 2022-05-07 ENCOUNTER — Ambulatory Visit (HOSPITAL_COMMUNITY): Payer: Self-pay

## 2022-05-08 ENCOUNTER — Ambulatory Visit (HOSPITAL_COMMUNITY): Payer: Self-pay

## 2022-05-11 ENCOUNTER — Ambulatory Visit (HOSPITAL_COMMUNITY): Payer: Self-pay

## 2022-05-12 ENCOUNTER — Ambulatory Visit (HOSPITAL_COMMUNITY): Payer: Self-pay

## 2022-05-13 ENCOUNTER — Ambulatory Visit (HOSPITAL_COMMUNITY): Payer: Self-pay

## 2022-05-13 ENCOUNTER — Ambulatory Visit (INDEPENDENT_AMBULATORY_CARE_PROVIDER_SITE_OTHER): Payer: No Payment, Other | Admitting: Licensed Clinical Social Worker

## 2022-05-13 DIAGNOSIS — F411 Generalized anxiety disorder: Secondary | ICD-10-CM
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
## 2022-05-14 ENCOUNTER — Ambulatory Visit (HOSPITAL_COMMUNITY): Payer: Self-pay

## 2022-05-14 ENCOUNTER — Telehealth (HOSPITAL_COMMUNITY): Payer: Self-pay | Admitting: Licensed Clinical Social Worker

## 2022-05-14 NOTE — Psych (Signed)
Virtual Visit via Video Note  I connected with Connie Cook on 05/06/22 at  9:00 AM EDT by a video enabled telemedicine application and verified that I am speaking with the correct person using two identifiers.  Location: Patient: Home Provider: Clinical Home office   I discussed the limitations of evaluation and management by telemedicine and the availability of in person appointments. The patient expressed understanding and agreed to proceed.  Follow Up Instructions:    I discussed the assessment and treatment plan with the patient. The patient was provided an opportunity to ask questions and all were answered. The patient agreed with the plan and demonstrated an understanding of the instructions.   The patient was advised to call back or seek an in-person evaluation if the symptoms worsen or if the condition fails to improve as anticipated.  I provided 240 minutes of non-face-to-face time during this encounter.   Quinn Axe, [facility]   [facility] PHP THERAPIST PROGRESS NOTE  Jamiyla Ishee [id number]  Session Time: 9-10  Participation Level: Active  Behavioral Response: CasualDrowsyAnxious and Depressed  Type of Therapy: Group Therapy  Treatment Goals addressed: Coping  Progress Towards Goals: Progressing  Interventions: CBT, DBT, Solution Focused, Strength-based, Supportive, and Reframing  Summary: Clinician led check-in regarding current stressors and situation, and review of patient completed daily inventory. Clinician utilized active listening and empathetic response and validated patient emotions. Clinician facilitated processing group on pertinent issues.?  Therapist Response: Connie Cook is a 61 y.o. female who presents with depression and anxiety symptoms. Patient arrived within time allowed. Patient rates her mood at a 7 on a scale of 1-10 with 10 being best. Pt reports she is "good." Pt reports she had a good afternoon. Pt reports  today is her last day in group and hopes she is able to continue to find support outside of group. Pt able to process. ?Pt engaged in discussion.?        Session Time: 10:00 am - 11:00 am   Participation Level: Active  Behavioral Response: CasualAlertAnxious and Depressed   Type of Therapy: Group Therapy   Treatment Goals addressed: Coping   Progress Towards Goals: Progressing   Interventions: CBT, DBT, Solution Focused, Strength-based, Supportive, and Reframing   Therapist Response: Clinician led group on anger.   Summary: Pt reports anger and trying to manage in more healthy ways.       Session Time: 11:00 am - 12:00 pm   Participation Level: Active   Behavioral Response: CasualAlertAnxious and Depressed   Type of Therapy: Group Therapy   Treatment Goals addressed: Coping   Progress Towards Goals: Progressing  Interventions: Skills training, Supportive   Summary: Connie Cook, talked with group about community.   Therapist Response: Pt participated in group and discussed what community means to self.     Session Time: 12:00 pm - 1:00 pm   Participation Level: Active   Behavioral Response: CasualAlertAnxious and Depressed   Type of Therapy: Group Therapy   Treatment Goals addressed: Coping   Progress Towards Goals: Progressing   Interventions: Skills training, Supportive   Therapist Response: 12:00 - 12:50 pm: OT group with Connie Cook about relationships 12:50 - 1:00 pm: Clinician led check-out. Clinician assessed for immediate needs, medication compliance and efficacy, and safety concerns?   Summary: 12:00 - 12:50 pm: Pt participated in group 12:50 - 1:00: Patient demonstrates progress as evidence by iincreased insight. Patient denies SI/HI/self-harm at the end of group.  Suicidal/Homicidal: Nowithout intent/plan  Plan: ?Pt will discharge from PHP due to meeting treatment goals of decreased depression and anxiety symptoms, increased  ability to manage symptoms, and increased goal directed behaviors. Pt's progress is noted by self report, observation, and scales. Pt and provider are aligned with this plan.Pt is scheduled for medication management with [name] on 7/13 @ 1030a and with [name] for therapy on 7/5 @ 4p. Pt denies SI/HI/pscyhosis at time of discharge.   Collaboration of Care: Medication Management AEB [name], NP    Patient/Guardian was advised Release of Information must be obtained prior to any record release in order to collaborate their care with an outside provider. Patient/Guardian was advised if they have not already done so to contact the registration department to sign all necessary forms in order for [place] to release information regarding their care.   Consent: Patient/Guardian gives verbal consent for treatment and assignment of benefits for services provided during this visit. Patient/Guardian expressed understanding and agreed to proceed.   Diagnosis: Severe episode of recurrent major depressive disorder, without psychotic features (HCC) [F33.2]    1. Severe episode of recurrent major depressive disorder, without psychotic features [facility])       Quinn Axe, [facility] 05/06/22

## 2022-05-14 NOTE — Telephone Encounter (Signed)
See call intake 

## 2022-05-14 NOTE — Psych (Signed)
Virtual Visit via Video Note  I connected with Connie Cook on 05/05/22 at  9:00 AM EDT by a video enabled telemedicine application and verified that I am speaking with the correct person using two identifiers.  Location: Patient: Home Provider: Clinical Home office   I discussed the limitations of evaluation and management by telemedicine and the availability of in person appointments. The patient expressed understanding and agreed to proceed.  Follow Up Instructions:    I discussed the assessment and treatment plan with the patient. The patient was provided an opportunity to ask questions and all were answered. The patient agreed with the plan and demonstrated an understanding of the instructions.   The patient was advised to call back or seek an in-person evaluation if the symptoms worsen or if the condition fails to improve as anticipated.  I provided 240 minutes of non-face-to-face time during this encounter.   Quinn Axe, [facility]   [facility] PHP THERAPIST PROGRESS NOTE  Connie Cook [id number]  Session Time: 9-10  Participation Level: Active  Behavioral Response: CasualDrowsyAnxious and Depressed  Type of Therapy: Group Therapy  Treatment Goals addressed: Coping  Progress Towards Goals: Progressing  Interventions: CBT, DBT, Solution Focused, Strength-based, Supportive, and Reframing  Summary: Clinician led check-in regarding current stressors and situation, and review of patient completed daily inventory. Clinician utilized active listening and empathetic response and validated patient emotions. Clinician facilitated processing group on pertinent issues.?  Therapist Response: Connie Cook is a 61 y.o. female who presents with depression and anxiety symptoms. Patient arrived within time allowed. Patient rates her mood at a 5 on a scale of 1-10 with 10 being best. Pt reports she is "annoyed." Pt reports sleep is decreased and appetite is  good. Pt reports she spending time with her sister and her puppy yesterday afternoon. Pt reports continued issues with negative self-talk. Pt able to process.?Pt engaged in discussion.?        Session Time: 10:00 am - 11:00 am   Participation Level: Active  Behavioral Response: CasualAlertAnxious and Depressed   Type of Therapy: Group Therapy   Treatment Goals addressed: Coping   Progress Towards Goals: Progressing   Interventions: CBT, DBT, Solution Focused, Strength-based, Supportive, and Reframing   Therapist Response: Clinician led group on grounding techniques.   Summary: Pt reports grounding techniques can be helpful for her.       Session Time: 11:00 am - 12:00 pm   Participation Level: Active   Behavioral Response: CasualAlertAnxious and Depressed   Type of Therapy: Group Therapy   Treatment Goals addressed: Coping   Progress Towards Goals: Progressing  Interventions: Skills training   Summary: OT group with cln, Beverely Risen continued with routines.   Therapist Response: Pt engaged in discussion.     Session Time: 12:00 pm - 1:00 pm   Participation Level: Active   Behavioral Response: CasualAlertAnxious and Depressed   Type of Therapy: Group Therapy   Treatment Goals addressed: Coping   Progress Towards Goals: Progressing   Interventions: CBT, DBT, Solution Focused, Strength-based, Supportive, and Reframing   Therapist Response: 12:00 - 12:50 pm: Clinician continued topic of "Positive Psychology". Group discussed the 5 activities to use to help change their lens. Pt's identified which activity they will try.  12:50 - 1:00 pm: Clinician led check-out. Clinician assessed for immediate needs, medication compliance and efficacy, and safety concerns?   Summary: 12:00 - 12:50 pm: Pt reports she will use writing gratitudes each night. 12:50 - 1:00: At  check-out, patient rates her mood at a 7 on a scale of 1-10 with 10 being great. Pt reports she is  going to have a "low key" afternoon around her house. Patient demonstrates some progress as evidenced willingness to discuss use of new skills. Patient denies SI/HI/self-harm at the end of group.  Suicidal/Homicidal: Nowithout intent/plan  Plan: ?Pt will continue in PHP and medication management while continuing to work on decreasing depression symptoms,?SI, and anxiety symptoms,?and increasing the ability to self manage symptoms.   Collaboration of Care: Medication Management AEB [name], NP  and Lafonda Mosses, RN  Patient/Guardian was advised Release of Information must be obtained prior to any record release in order to collaborate their care with an outside provider. Patient/Guardian was advised if they have not already done so to contact the registration department to sign all necessary forms in order for [place] to release information regarding their care.   Consent: Patient/Guardian gives verbal consent for treatment and assignment of benefits for services provided during this visit. Patient/Guardian expressed understanding and agreed to proceed.   Diagnosis: Severe episode of recurrent major depressive disorder, without psychotic features (HCC) [F33.2]    1. Severe episode of recurrent major depressive disorder, without psychotic features (HCC)   2. Generalized anxiety disorder       Quinn Axe, [facility] 05/05/22

## 2022-05-14 NOTE — Psych (Signed)
Virtual Visit via Video Note  I connected with Connie Cook on 05/04/22 at  9:00 AM EDT by a video enabled telemedicine application and verified that I am speaking with the correct person using two identifiers.  Location: Patient: Home Provider: Clinical Home office   I discussed the limitations of evaluation and management by telemedicine and the availability of in person appointments. The patient expressed understanding and agreed to proceed.  Follow Up Instructions:    I discussed the assessment and treatment plan with the patient. The patient was provided an opportunity to ask questions and all were answered. The patient agreed with the plan and demonstrated an understanding of the instructions.   The patient was advised to call back or seek an in-person evaluation if the symptoms worsen or if the condition fails to improve as anticipated.  I provided 240 minutes of non-face-to-face time during this encounter.   Quinn Axe, [facility]   [facility] PHP THERAPIST PROGRESS NOTE  Connie Cook [id number]  Session Time: 9-10  Participation Level: Active  Behavioral Response: CasualDrowsyAnxious and Depressed  Type of Therapy: Group Therapy  Treatment Goals addressed: Coping  Progress Towards Goals: Progressing  Interventions: CBT, DBT, Solution Focused, Strength-based, Supportive, and Reframing  Summary: Clinician led check-in regarding current stressors and situation, and review of patient completed daily inventory. Clinician utilized active listening and empathetic response and validated patient emotions. Clinician facilitated processing group on pertinent issues.?  Therapist Response: Connie Cook is a 61 y.o. female who presents with depression and anxiety symptoms. Patient arrived within time allowed. Patient rates her mood at a 7 on a scale of 1-10 with 10 being best. Pt reports she is "OK." Pt reports sleep is getting better and appetite is  fine. Pt reports she has a sinus infection so she rested over the weekend. Pt able to process.?Pt engaged in discussion.?        Session Time: 10:00 am - 11:00 am   Participation Level: Active  Behavioral Response: CasualDrowsyAnxious and Depressed   Type of Therapy: Group Therapy   Treatment Goals addressed: Coping   Progress Towards Goals: Progressing   Interventions: CBT, DBT, Solution Focused, Strength-based, Supportive, and Reframing   Therapist Response: Clinician led group on how to handle stigma related to mental health and deal with anger.   Summary: Pt reports experiencing stigma and is focusing on her own treatment.       Session Time: 11:00 am - 12:00 pm   Participation Level: Active   Behavioral Response: CasualDrowsyAnxious and Depressed   Type of Therapy: Group Therapy   Treatment Goals addressed: Coping   Progress Towards Goals: Progressing  Interventions: Skills training   Summary: OT group with cln, [name] on routines.   Therapist Response: Pt engaged in discussion.     Session Time: 12:00 pm - 1:00 pm   Participation Level: None   Behavioral Response: CasualDrowsyAnxious and Depressed   Type of Therapy: Group Therapy   Treatment Goals addressed: Coping   Progress Towards Goals: Progressing   Interventions: CBT, DBT, Solution Focused, Strength-based, Supportive, and Reframing   Therapist Response: 12:00 - 12:50 pm: Clinician introduced topic of "Positive Psychology". Group watched "Positive Psychology" Ted-Talk. Patients discussed how their "lens" of life affects the way they feel.  12:50 - 1:00 pm: Clinician led check-out. Clinician assessed for immediate needs, medication compliance and efficacy, and safety concerns?   Summary: 12:00 - 12:50 pm: Pt reports she fell asleep during the 12 minute video.  12:50 - 1:00: At check-out, patient rates her mood at a 7 on a scale of 1-10 with 10 being great. Pt reports she is going to clean  and eat this afternoon. Patient demonstrates some progress as evidenced by increased insight. Patient denies SI/HI/self-harm at the end of group.  Suicidal/Homicidal: Nowithout intent/plan  Plan: ?Pt will continue in PHP and medication management while continuing to work on decreasing depression symptoms,?SI, and anxiety symptoms,?and increasing the ability to self manage symptoms.   Collaboration of Care: Medication Management AEB [name], NP  Patient/Guardian was advised Release of Information must be obtained prior to any record release in order to collaborate their care with an outside provider. Patient/Guardian was advised if they have not already done so to contact the registration department to sign all necessary forms in order for [place] to release information regarding their care.   Consent: Patient/Guardian gives verbal consent for treatment and assignment of benefits for services provided during this visit. Patient/Guardian expressed understanding and agreed to proceed.   Diagnosis: Severe episode of recurrent major depressive disorder, without psychotic features (HCC) [F33.2]    1. Severe episode of recurrent major depressive disorder, without psychotic features (HCC)   2. Generalized anxiety disorder       Quinn Axe, [facility] 05/04/22

## 2022-05-15 ENCOUNTER — Ambulatory Visit (HOSPITAL_COMMUNITY): Payer: Self-pay

## 2022-05-18 ENCOUNTER — Other Ambulatory Visit: Payer: Self-pay

## 2022-05-20 ENCOUNTER — Other Ambulatory Visit: Payer: Self-pay

## 2022-05-20 ENCOUNTER — Ambulatory Visit (HOSPITAL_COMMUNITY): Payer: Commercial Managed Care - PPO | Admitting: Licensed Clinical Social Worker

## 2022-05-21 ENCOUNTER — Ambulatory Visit (HOSPITAL_COMMUNITY): Payer: Self-pay

## 2022-05-27 ENCOUNTER — Ambulatory Visit (INDEPENDENT_AMBULATORY_CARE_PROVIDER_SITE_OTHER): Payer: No Payment, Other | Admitting: Licensed Clinical Social Worker

## 2022-05-27 DIAGNOSIS — F411 Generalized anxiety disorder: Secondary | ICD-10-CM | POA: Diagnosis not present

## 2022-05-27 NOTE — Progress Notes (Signed)
   THERAPIST PROGRESS NOTE  Session Time: 50 minutes  Participation Level: Active  Behavioral Response: CasualAlertAnxious  Type of Therapy: Individual Therapy  Treatment Goals addressed: self-esteem  ProgressTowards Goals: Progressing  Interventions: CBT and Motivational Interviewing  Summary: Connie Cook is a 61 y.o. female who presents for f/u with this cln. She arrives on time and maintains good eye contact throughout the session. She reports she has been feeling anxious this morning but is starting to feel better. She also endorses recent symptoms of fatigue and decreased motivation worsened by low-back pain that has since improved. She states she continues to feel anxious regarding whether she will be approved for SSD. 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 When discussing anxiety surrounding jobs, she relates the issues to her self-esteem and describes how she has never felt good enough or that her feelings matter. She identifies recurring negative self-talk of "I'm lazy" "I'm not trying hard enough" and "I ruined my chance of getting jobs." She is receptive to reframing and identifying facts surrounding these distortions. When asked what kind of job interests her, pt states "No one has ever asked me what I wanted to do." She reports loving animals and previously working as a Software engineer and reports she is open to revisiting this. She is receptive to feedback from cln.  Suicidal/Homicidal: Nowithout intent/plan  Therapist Response: Cln assessed for current stressors, symptoms, and safety since last session. Cln utilized active listening and validation to assist with processing. Cln discussed cognitive distortions and assisted pt with evaluating and reframing current negative self-talk. Cln also pointed out that her employment issues took place during Covid and utilized  minimal self-disclosure to relate to difficulties experienced during the pandemic. Cln highlighted the fact that her employers were also struggling during that unprecedented period. Cln encouraged pt to utilize self-love meditation and reframing statements when the negative self-talk occurs. Cln also reframed pt's assertion that she cannot work as she can get back to work with the right support. Cln confirmed next scheduled appts and wrote them on a post-it for pt. Cln advised her to call regarding late cancellations/no-shows for a sooner appt if needed.  Plan: Return again in 8 weeks (next available appt).  Diagnosis: Generalized anxiety disorder  Collaboration of Care: Other none required for this visit  Patient/Guardian was advised Release of Information must be obtained prior to any record release in order to collaborate their care with an outside provider. Patient/Guardian was advised if they have not already done so to contact the registration department to sign all necessary forms in order for [place] to release information regarding their care.   Consent: Patient/Guardian gives verbal consent for treatment and assignment of benefits for services provided during this visit. Patient/Guardian expressed understanding and agreed to proceed.   Wyvonnia Lora, LCSWA 05/27/2022

## 2022-05-28 ENCOUNTER — Encounter (HOSPITAL_COMMUNITY): Payer: Self-pay | Admitting: Psychiatry

## 2022-05-28 ENCOUNTER — Ambulatory Visit (HOSPITAL_COMMUNITY): Payer: Self-pay

## 2022-05-28 ENCOUNTER — Other Ambulatory Visit: Payer: Self-pay

## 2022-05-28 ENCOUNTER — Telehealth (INDEPENDENT_AMBULATORY_CARE_PROVIDER_SITE_OTHER): Payer: No Payment, Other | Admitting: Psychiatry

## 2022-05-28 DIAGNOSIS — F32A Depression, unspecified: Secondary | ICD-10-CM | POA: Diagnosis not present

## 2022-05-28 DIAGNOSIS — F411 Generalized anxiety disorder: Secondary | ICD-10-CM

## 2022-05-28 MED ORDER — SERTRALINE HCL 50 MG PO TABS
150.0000 mg | ORAL_TABLET | Freq: Every day | ORAL | 3 refills | Status: DC
  Filled 2022-05-28: qty 90, 30d supply, fill #0

## 2022-05-28 MED ORDER — ARIPIPRAZOLE 20 MG PO TABS
20.0000 mg | ORAL_TABLET | Freq: Every day | ORAL | 3 refills | Status: DC
  Filled 2022-05-28: qty 30, 30d supply, fill #0

## 2022-05-28 MED ORDER — GABAPENTIN 300 MG PO CAPS
300.0000 mg | ORAL_CAPSULE | Freq: Three times a day (TID) | ORAL | 3 refills | Status: DC
  Filled 2022-05-28: qty 90, 30d supply, fill #0

## 2022-05-28 MED ORDER — HYDROXYZINE HCL 25 MG PO TABS
25.0000 mg | ORAL_TABLET | Freq: Three times a day (TID) | ORAL | 3 refills | Status: DC | PRN
  Filled 2022-05-28: qty 90, 30d supply, fill #0

## 2022-05-28 NOTE — Progress Notes (Signed)
BH MD/PA/NP OP Progress Note Virtual Visit via Video Note  I connected with Connie Cook on 05/28/22 at 10:30 AM EDT by a video enabled telemedicine application and verified that I am speaking with the correct person using two identifiers.  Location: Patient: Home Provider: Clinic   I discussed the limitations of evaluation and management by telemedicine and the availability of in person appointments. The patient expressed understanding and agreed to proceed.  I provided 30 minutes of non-face-to-face time during this encounter.    05/28/2022 10:31 AM Connie Cook  MRN:  [id number]  Chief Complaint: "I am doing okay  HPI: 61 year old female seen today for follow up psychiatric evaluation. She has a psychiatric history of SI, depression, and anxiety. She is currently managed on Zoloft 150 mg daily, hydroxyzine 10 mg 3 times daily, gabapentin 300 mg 3 times daily and Abilify 15 mg daily. She notes that her  medications are effective in managing her psychiatric conditions.  Today she was unable to login virtually so her assessment was done over the phone.  During exam she was pleasant, cooperative, and engaged in conversation.  She informed Clinical research associate that since her last visit she has been doing okay.  She notes that her anxiety and depression are well managed and reports that her mood is stable.  Provider conducted a GAD-7 and patient scored an 11.  Provider also conducted PHQ-9 and patient scored a 13.  She endorses adequate sleep and appetite.  Today she denies SI/HI/AVH, mania, or paranoia.    Patient informed Clinical research associate that she has been maintaining her weight however notes that she will still be following up with a nutritionist that she was referred to by Clinical research associate in September.    No medication changes made today.  Patient agreeable to continue medications as prescribed.  No other concerns at this time.        Visit Diagnosis:    ICD-10-CM   1. Mild depression  F32.A  ARIPiprazole (ABILIFY) 20 MG tablet    sertraline (ZOLOFT) 50 MG tablet    2. Generalized anxiety disorder  F41.1 gabapentin (NEURONTIN) 300 MG capsule    hydrOXYzine (ATARAX) 25 MG tablet      Past Psychiatric History:  SI, depression, and anxiety  Past Medical History:  Past Medical History:  Diagnosis Date   Anxiety    Arthritis    DDD (degenerative disc disease), cervical    Depression    H/O adenoidectomy    History of removal of ovarian cyst    Hyperlipidemia     Past Surgical History:  Procedure Laterality Date   APPENDECTOMY      Family Psychiatric History: Father Alcohol use, paternal aunt/uncle alcohol use   Family History:  Family History  Problem Relation Age of Onset   Depression Mother    Depression Father    Alcohol abuse Father    Alcohol abuse Maternal Uncle    Depression Paternal Uncle    Alcohol abuse Paternal Uncle    Depression Cousin     Social History:  Social History   Socioeconomic History   Marital status: Single    Spouse name: Not on file   Number of children: 0   Years of education: Not on file   Highest education level: Some college, no degree  Occupational History   Not on file  Tobacco Use   Smoking status: Former   Smokeless tobacco: Former  Building services engineer Use: Never used  Substance and  Sexual Activity   Alcohol use: Never   Drug use: Never   Sexual activity: Not on file  Other Topics Concern   Not on file  Social History Narrative   Not on file   Social Determinants of Health   Financial Resource Strain: Not on file  Food Insecurity: Not on file  Transportation Needs: Not on file  Physical Activity: Not on file  Stress: Not on file  Social Connections: Not on file    Allergies:  Allergies  Allergen Reactions   Codeine Shortness Of Breath   Penicillins Rash   Tramadol Itching    Metabolic Disorder Labs: No results found for: "HGBA1C", "MPG" No results found for: "PROLACTIN" No results found  for: "CHOL", "TRIG", "HDL", "CHOLHDL", "VLDL", "LDLCALC" No results found for: "TSH"  Therapeutic Level Labs: No results found for: "LITHIUM" No results found for: "VALPROATE" No results found for: "CBMZ"  Current Medications: Current Outpatient Medications  Medication Sig Dispense Refill   ARIPiprazole (ABILIFY) 20 MG tablet Take 1 tablet (20 mg total) by mouth once daily. 30 tablet 3   cephALEXin (KEFLEX) 500 MG capsule Take 500 mg by mouth 4 (four) times daily. (Patient not taking: Reported on 11/29/2020)     clindamycin (CLEOCIN) 150 MG capsule Take by mouth 3 (three) times daily. (Patient not taking: Reported on 11/29/2020)     cyclobenzaprine (FLEXERIL) 5 MG tablet Take 5 mg by mouth 3 (three) times daily as needed for muscle spasms. (Patient not taking: Reported on 04/07/2022)     gabapentin (NEURONTIN) 300 MG capsule Take 1 capsule (300 mg total) by mouth 3 (three) times daily. 90 capsule 3   HYDROcodone-acetaminophen (NORCO) 10-325 MG per tablet Take 1 tablet by mouth every 6 (six) hours as needed. (Patient not taking: Reported on 11/29/2020)     hydrOXYzine (ATARAX) 25 MG tablet Take 1 tablet (25 mg total) by mouth 3 (three) times daily as needed. 90 tablet 3   LORazepam (ATIVAN) 1 MG tablet Take 1 mg by mouth daily.     Probiotic Product (PROBIOTIC DAILY PO) Take by mouth. (Patient not taking: Reported on 11/29/2020)     rosuvastatin (CRESTOR) 20 MG tablet Take 20 mg by mouth daily. (Patient not taking: Reported on 11/29/2020)     sertraline (ZOLOFT) 50 MG tablet Take 3 tablets (150 mg total) by mouth once once daily. 90 tablet 3   No current facility-administered medications for this visit.     Musculoskeletal: Strength & Muscle Tone:  unable to assess due to telephone visit Gait & Station:  Unable to assess due to telephone visit Patient leans: N/A  Psychiatric Specialty Exam: Review of Systems  There were no vitals taken for this visit.There is no height or weight on file to  calculate BMI.  General Appearance:  Unable to assess due to telephone visit  Eye Contact:   Unable to assess due to telephone visit  Speech:  Clear and Coherent and Slow  Volume:  Normal  Mood:  Euthymic  Affect:  Appropriate and Congruent  Thought Process:  Coherent, Goal Directed and Linear  Orientation:  Full (Time, Place, and Person)  Thought Content: WDL and Logical   Suicidal Thoughts:  No  Homicidal Thoughts:  No  Memory:  Immediate;   Good Recent;   Good Remote;   Good  Judgement:  Good  Insight:  Good  Psychomotor Activity:   Unable to assess due to telephone visit  Concentration:  Concentration: Good and Attention Span: Good  Recall:  [place] Major of Knowledge: Good  Language: Good  Akathisia:   Unable to assess due to telephone visit  Handed:  Right  AIMS (if indicated): Not done  Assets:  Communication Skills Desire for Improvement Financial Resources/Insurance Housing Social Support  ADL's:  Intact  Cognition: WNL  Sleep:  Good   Screenings: GAD-7    Flowsheet Row Video Visit from 05/28/2022 in [facility] Video Visit from 04/29/2022 in [facility] Counselor from 02/23/2022 in [facility] Video Visit from 12[facility]11/2021 in [facility] Video Visit from 09/24/2021 in [facility]  Total GAD-7 Score 11 8 21 15 11       PHQ2-9    Flowsheet Row Video Visit from 05/28/2022 in [facility] Counselor from 05/05/2022 in [facility] Video Visit from 04/29/2022 in [facility] Counselor from 04/20/2022 in [facility] Counselor from 04/07/2022 in [facility]  PHQ-2 Total Score 5 2 5  0 4  PHQ-9 Total Score 13 16 15 12 17       Flowsheet Row Video Visit from 05/28/2022 in [facility] Counselor from 04/03/2022 in [facility] Counselor from 02/23/2022 in [facility]  C-SSRS RISK CATEGORY Error: Question 6 not populated Error: Q3, 4, or 5 should not be populated when Q2 is No Error: Q3, 4, or 5 should not be populated when Q2 is No        Assessment and Plan: Patient reports that she  is doing well on her current medication regimen.  No medication changes made today.  Patient agreeable to continue medications as prescribed.    1. Mild depression  Continue- ARIPiprazole (ABILIFY) 20 MG tablet; Take 1 tablet (20 mg total) by mouth once daily.  Dispense: 30 tablet; Refill: 3 Continue- sertraline (ZOLOFT) 50 MG tablet; Take 3 tablets (150 mg total) by mouth once daily.  Dispense: 90 tablet; Refill: 3  2. Generalized anxiety disorder  Continue- hydrOXYzine (ATARAX) 25 MG tablet; Take 1 tablet (25 mg total) by mouth 3 (three) times daily as needed.  Dispense: 90 tablet; Refill: 3 Continue- gabapentin (NEURONTIN) 300 MG capsule; Take 1 capsule (300 mg total) by mouth 3 (three) times daily.  Dispense: 90 capsule; Refill: 3      Follow-up in 3 months Follow-up with therapy   [facility], NP 05/28/2022, 10:31 AM

## 2022-06-04 ENCOUNTER — Ambulatory Visit (HOSPITAL_COMMUNITY): Payer: Self-pay

## 2022-06-11 ENCOUNTER — Ambulatory Visit (HOSPITAL_COMMUNITY): Payer: Self-pay

## 2022-06-18 ENCOUNTER — Ambulatory Visit (HOSPITAL_COMMUNITY): Payer: Self-pay

## 2022-06-25 ENCOUNTER — Ambulatory Visit: Payer: Self-pay | Attending: Family Medicine | Admitting: Family Medicine

## 2022-06-25 ENCOUNTER — Ambulatory Visit (HOSPITAL_COMMUNITY)
Admission: RE | Admit: 2022-06-25 | Discharge: 2022-06-25 | Disposition: A | Discharge status: Home / Self Care | Payer: Self-pay | Source: Ambulatory Visit | Attending: Family Medicine | Admitting: Family Medicine

## 2022-06-25 ENCOUNTER — Ambulatory Visit (HOSPITAL_COMMUNITY): Payer: Self-pay

## 2022-06-25 ENCOUNTER — Other Ambulatory Visit: Payer: Self-pay

## 2022-06-25 ENCOUNTER — Encounter: Payer: Self-pay | Admitting: Family Medicine

## 2022-06-25 VITALS — BP 121/79 | HR 78 | Temp 98.2°F | Ht 60.0 in | Wt 224.0 lb

## 2022-06-25 DIAGNOSIS — G8929 Other chronic pain: Secondary | ICD-10-CM | POA: Insufficient documentation

## 2022-06-25 DIAGNOSIS — M545 Low back pain, unspecified: Secondary | ICD-10-CM

## 2022-06-25 DIAGNOSIS — F332 Major depressive disorder, recurrent severe without psychotic features: Secondary | ICD-10-CM

## 2022-06-25 DIAGNOSIS — M67912 Unspecified disorder of synovium and tendon, left shoulder: Secondary | ICD-10-CM

## 2022-06-25 DIAGNOSIS — Z131 Encounter for screening for diabetes mellitus: Secondary | ICD-10-CM

## 2022-06-25 DIAGNOSIS — Z1159 Encounter for screening for other viral diseases: Secondary | ICD-10-CM

## 2022-06-25 DIAGNOSIS — Z13228 Encounter for screening for other metabolic disorders: Secondary | ICD-10-CM

## 2022-06-25 DIAGNOSIS — M25561 Pain in right knee: Secondary | ICD-10-CM | POA: Insufficient documentation

## 2022-06-25 MED ORDER — METHOCARBAMOL 750 MG PO TABS
750.0000 mg | ORAL_TABLET | Freq: Three times a day (TID) | ORAL | 1 refills | Status: DC | PRN
  Filled 2022-06-25 (×2): qty 90, 30d supply, fill #0

## 2022-06-25 MED ORDER — MELOXICAM 7.5 MG PO TABS
7.5000 mg | ORAL_TABLET | Freq: Every day | ORAL | 1 refills | Status: DC
  Filled 2022-06-25: qty 30, 30d supply, fill #0

## 2022-06-25 NOTE — Progress Notes (Signed)
Patient having pain in left shoulder and right knee.

## 2022-06-25 NOTE — Progress Notes (Signed)
Subjective:  Patient ID: Connie Cook, female    DOB: 1961/02/18  Age: 61 y.o. MRN: [id number]  CC: New Patient (Initial Visit)   HPI Alaira Doralee Kocak is a 61 y.o. 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 Previously followed by [facility] health.  Interval History: Her depression and generalized anxiety disorder are currently managed by [facility]  She twisted her right knee a couple of months ago and now has medial knee pain and pain going up and down stairs with some popliteal swelling. At the moment pain is 5/10 ut goes up to 7/10. She uses Gabapentin, Ibuprofen with some relief  Her left shoulder has been hurting for several years and she received a cortisone injection in the past from [facility]  years ago.  She sometimes has tingling radiating down her left upper extremity from her shoulder. MRI shoulder from 2016: IMPRESSION: Moderate rotator cuff tendinopathy/ tendinosis but no full thickness retracted tear.   Intact long head biceps tendon and grossly normal glenoid labrum.   Moderate AC joint degenerative changes but no other significant findings for bony impingement.   Mild glenohumeral joint degenerative changes with small joint effusion and mild synovitis.      She also has chronic Low back pain which is absent now but worse with prolonged standing.  Was informed she had degenerative disc disease in the past Past Medical History:  Diagnosis Date   Anxiety    Arthritis    DDD (degenerative disc disease), cervical    Depression    H/O adenoidectomy    History of removal of ovarian cyst    Hyperlipidemia     Past Surgical History:  Procedure Laterality Date   APPENDECTOMY      Family History  Problem Relation Age of Onset   Depression Mother    Depression Father    Alcohol abuse Father    Alcohol abuse Maternal  Uncle    Depression Paternal Uncle    Alcohol abuse Paternal Uncle    Depression Cousin     Social History   Socioeconomic History   Marital status: Single    Spouse name: Not on file   Number of children: 0   Years of education: Not on file   Highest education level: Some college, no degree  Occupational History   Not on file  Tobacco Use   Smoking status: Former   Smokeless tobacco: Former  Scientific laboratory technician Use: Never used  Substance and Sexual Activity   Alcohol use: Never   Drug use: Never   Sexual activity: Not on file  Other Topics Concern   Not on file  Social History Narrative   Not on file   Social Determinants of Health   Financial Resource Strain: Not on file  Food Insecurity: Not on file  Transportation Needs: Not on file  Physical Activity: Not on file  Stress: Not on file  Social Connections: Not on file    Allergies  Allergen Reactions   Codeine Shortness Of Breath   Penicillins Rash   Tramadol Itching    Outpatient Medications Prior to Visit  Medication Sig Dispense Refill   ARIPiprazole (ABILIFY) 20 MG tablet Take 1 tablet (20 mg total) by mouth once daily. 30 tablet 3   gabapentin (NEURONTIN) 300 MG capsule Take 1 capsule (300 mg total) by mouth 3 (three) times daily. 90 capsule  3   hydrOXYzine (ATARAX) 25 MG tablet Take 1 tablet (25 mg total) by mouth 3 (three) times daily as needed. 90 tablet 3   LORazepam (ATIVAN) 1 MG tablet Take 1 mg by mouth daily.     sertraline (ZOLOFT) 50 MG tablet Take 3 tablets (150 mg total) by mouth once once daily. 90 tablet 3   HYDROcodone-acetaminophen (NORCO) 10-325 MG per tablet Take 1 tablet by mouth every 6 (six) hours as needed.     Probiotic Product (PROBIOTIC DAILY PO) Take by mouth.     rosuvastatin (CRESTOR) 20 MG tablet Take 20 mg by mouth daily.     cephALEXin (KEFLEX) 500 MG capsule Take 500 mg by mouth 4 (four) times daily. (Patient not taking: Reported on 11/29/2020)     clindamycin (CLEOCIN)  150 MG capsule Take by mouth 3 (three) times daily. (Patient not taking: Reported on 11/29/2020)     cyclobenzaprine (FLEXERIL) 5 MG tablet Take 5 mg by mouth 3 (three) times daily as needed for muscle spasms.     No facility-administered medications prior to visit.     ROS Review of Systems  Constitutional:  Negative for activity change, appetite change and fatigue.  HENT:  Negative for congestion, sinus pressure and sore throat.   Eyes:  Negative for visual disturbance.  Respiratory:  Negative for cough, chest tightness, shortness of breath and wheezing.   Cardiovascular:  Negative for chest pain and palpitations.  Gastrointestinal:  Negative for abdominal distention, abdominal pain and constipation.  Endocrine: Negative for polydipsia.  Genitourinary:  Negative for dysuria and frequency.  Musculoskeletal:        See HPI  Skin:  Negative for rash.  Neurological:  Negative for tremors, light-headedness and numbness.  Hematological:  Does not bruise/bleed easily.  Psychiatric/Behavioral:  Negative for agitation and behavioral problems.     Objective:  BP 121/79   Pulse 78   Temp 98.2 F (36.8 C) (Oral)   Ht 5' (1.524 m)   Wt 224 lb (101.6 kg)   SpO2 94%   BMI 43.75 kg/m      06/25/2022    8:58 AM 11/13/2014    8:27 AM 09/18/2014    8:34 AM  BP/Weight  Systolic BP [phone]  Diastolic BP 79 74 89  Wt. (Lbs) 224    BMI 43.75 kg/m2        Physical Exam Constitutional:      Appearance: She is well-developed.  Cardiovascular:     Rate and Rhythm: Normal rate.     Heart sounds: Normal heart sounds. No murmur heard. Pulmonary:     Effort: Pulmonary effort is normal.     Breath sounds: Normal breath sounds. No wheezing or rales.  Chest:     Chest wall: No tenderness.  Abdominal:     General: Bowel sounds are normal. There is no distension.     Palpations: Abdomen is soft. There is no mass.     Tenderness: There is no abdominal tenderness.  Musculoskeletal:      Right lower leg: No edema.     Left lower leg: No edema.     Comments: Right knee: Slight tenderness on medial joint line and on inversion of knee joint No crepitus Left knee: Normal  Left shoulder: Normal appearance, abduction limited to 90 degrees Right shoulder: Normal  Neurological:     Mental Status: She is alert and oriented to person, place, and time.  Psychiatric:  Mood and Affect: Mood normal.    Assessment & Plan:  1. Tendinopathy of left rotator cuff Status post cortisone injection in the past She may need another round of cortisone injection - AMB referral to orthopedics - meloxicam (MOBIC) 7.5 MG tablet; Take 1 tablet (7.5 mg total) by mouth daily.  Dispense: 30 tablet; Refill: 1  2. Chronic midline low back pain without sciatica Asymptomatic at the moment Weight loss will be beneficial Advised to apply heat or ice whichever is tolerated to painful areas. Counseled on evidence of improvement in pain control with regards to yoga, water aerobics, massage, home physical therapy, exercise as tolerated. - Ambulatory referral to Physical Therapy - methocarbamol (ROBAXIN-750) 750 MG tablet; Take 1 tablet (750 mg total) by mouth every 8 (eight) hours as needed for muscle spasms.  Dispense: 90 tablet; Refill: 1  3. Chronic pain of right knee Secondary to trauma a few months ago If x-ray is unrevealing and PT ineffective consider MRI to evaluate for meniscal or ligamentous pathology. Weight loss will be beneficial - Ambulatory referral to Physical Therapy - DG Knee Complete 4 Views Right; Future - meloxicam (MOBIC) 7.5 MG tablet; Take 1 tablet (7.5 mg total) by mouth daily.  Dispense: 30 tablet; Refill: 1  4. Screening for diabetes mellitus - Hemoglobin A1c  5. Screening for metabolic disorder - LP+Non-HDL Cholesterol - CMP14+EGFR  6. Screening for viral disease - HCV Ab w Reflex to Quant PCR - HIV Antibody (routine testing w rflx)  7. Severe episode of  recurrent major depressive disorder, without psychotic features ([place]) Controlled Continue pharmacotherapeutic agents as per GCB H - CBC with Differential/Platelet   Health Care Maintenance: Will address at next visit Meds ordered this encounter  Medications   meloxicam (MOBIC) 7.5 MG tablet    Sig: Take 1 tablet (7.5 mg total) by mouth daily.    Dispense:  30 tablet    Refill:  1   methocarbamol (ROBAXIN-750) 750 MG tablet    Sig: Take 1 tablet (750 mg total) by mouth every 8 (eight) hours as needed for muscle spasms.    Dispense:  90 tablet    Refill:  1    Follow-up: Return in about 3 months (around 09/25/2022) for knee and back pain.       Charlott Rakes, MD, FAAFP. [facility] [place], [place]   06/25/2022, 9:38 AM

## 2022-06-25 NOTE — Patient Instructions (Signed)
Chronic Knee Pain, Adult Chronic knee pain is pain in one or both knees that lasts longer than 3 months. Symptoms of chronic knee pain may include swelling, stiffness, and discomfort. Age-related wear and tear (osteoarthritis) of the knee joint is the most common cause of chronic knee pain. Other possible causes include: A long-term immune-related disease that causes inflammation of the knee (rheumatoid arthritis). This usually affects both knees. Inflammatory arthritis, such as gout or pseudogout. An injury to the knee that causes arthritis. An injury to the knee that damages the ligaments. Ligaments are strong tissues that connect bones to each other. Runner's knee or pain behind the kneecap. Treatment for chronic knee pain depends on the cause. The main treatments for chronic knee pain are physical therapy and weight loss. This condition may also be treated with medicines, injections, a knee sleeve or brace, and by using crutches. Rest, ice, pressure (compression), and elevation, also known as RICE therapy, may also be recommended. Follow these instructions at home: If you have a knee sleeve or brace:  Wear the knee sleeve or brace as told by your health care provider. Remove it only as told by your health care provider. Loosen it if your toes tingle, become numb, or turn cold and blue. Keep it clean. If the sleeve or brace is not waterproof: Do not let it get wet. Remove it if allowed by your health care provider, or cover it with a watertight covering when you take a bath or a shower. Managing pain, stiffness, and swelling     If directed, apply heat to the affected area as often as told by your health care provider. Use the heat source that your health care provider recommends, such as a moist heat pack or a heating pad. If you have a removable knee sleeve or brace, remove it as told by your health care provider. Place a towel between your skin and the heat source. Leave the heat on for  20-30 minutes. Remove the heat if your skin turns bright red. This is especially important if you are unable to feel pain, heat, or cold. You may have a greater risk of getting burned. If directed, put ice on the affected area. To do this: If you have a removable knee sleeve or brace, remove it as told by your health care provider. Put ice in a plastic bag. Place a towel between your skin and the bag. Leave the ice on for 20 minutes, 2-3 times a day. Remove the ice if your skin turns bright red. This is very important. If you cannot feel pain, heat, or cold, you have a greater risk of damage to the area. Move your toes often to reduce stiffness and swelling. Raise (elevate) the injured area above the level of your heart while you are sitting or lying down. Activity Avoid high-impact activities or exercises, such as running, jumping rope, or doing jumping jacks. Follow the exercise plan that your health care provider designed for you. Your health care provider may suggest that you: Avoid activities that make knee pain worse. This may require you to change your exercise routines, sport participation, or job duties. Wear shoes with cushioned soles. Avoid sports that require running and sudden changes in direction. Do physical therapy. Physical therapy is planned to match your needs and abilities. It may include exercises for strength, flexibility, stability, and endurance. Do exercises that increase balance and strength, such as tai chi and yoga. Do not use the injured limb to support your   body weight until your health care provider says that you can. Use crutches as told by your health care provider. Return to your normal activities as told by your health care provider. Ask your health care provider what activities are safe for you. General instructions Take over-the-counter and prescription medicines only as told by your health care provider. Lose weight if you are overweight. Losing even a  little weight can reduce knee pain. Ask your health care provider what your ideal weight is, and how to safely lose extra weight. A dietitian may be able to help you plan your meals. Do not use any products that contain nicotine or tobacco, such as cigarettes, e-cigarettes, and chewing tobacco. These can delay healing. If you need help quitting, ask your health care provider. Keep all follow-up visits. This is important. Contact a health care provider if: You have knee pain that is not getting better or gets worse. You are unable to do your physical therapy exercises due to knee pain. Get help right away if: Your knee swells and the swelling becomes worse. You cannot move your knee. You have severe knee pain. Summary Knee pain that lasts more than 3 months is considered chronic knee pain. The main treatments for chronic knee pain are physical therapy and weight loss. You may also need to take medicines, wear a knee sleeve or brace, use crutches, and apply ice or heat. Losing even a little weight can reduce knee pain. Ask your health care provider what your ideal weight is, and how to safely lose extra weight. A dietitian may be able to help you plan your meals. Follow the exercise plan that your health care provider designed for you. This information is not intended to replace advice given to you by your health care provider. Make sure you discuss any questions you have with your health care provider. Document Revised: 04/17/2020 Document Reviewed: 04/17/2020 Elsevier Patient Education  [place].

## 2022-06-26 ENCOUNTER — Other Ambulatory Visit: Payer: Self-pay

## 2022-06-26 ENCOUNTER — Other Ambulatory Visit: Payer: Self-pay | Admitting: Family Medicine

## 2022-06-26 DIAGNOSIS — R7303 Prediabetes: Secondary | ICD-10-CM | POA: Insufficient documentation

## 2022-06-26 DIAGNOSIS — E7800 Pure hypercholesterolemia, unspecified: Secondary | ICD-10-CM

## 2022-06-26 DIAGNOSIS — E119 Type 2 diabetes mellitus without complications: Secondary | ICD-10-CM | POA: Insufficient documentation

## 2022-06-26 LAB — CBC WITH DIFFERENTIAL/PLATELET
Basophils Absolute: 0.1 10*3/uL (ref 0.0–0.2)
Basos: 1 %
EOS (ABSOLUTE): 0.2 10*3/uL (ref 0.0–0.4)
Eos: 3 %
Hematocrit: 41.7 % (ref 34.0–46.6)
Hemoglobin: 13.7 g/dL (ref 11.1–15.9)
Immature Grans (Abs): 0 10*3/uL (ref 0.0–0.1)
Immature Granulocytes: 0 %
Lymphocytes Absolute: 1.8 10*3/uL (ref 0.7–3.1)
Lymphs: 24 %
MCH: 27.1 pg (ref 26.6–33.0)
MCHC: 32.9 g/dL (ref 31.5–35.7)
MCV: 82 fL (ref 79–97)
Monocytes Absolute: 0.5 10*3/uL (ref 0.1–0.9)
Monocytes: 7 %
Neutrophils Absolute: 5.1 10*3/uL (ref 1.4–7.0)
Neutrophils: 65 %
Platelets: 341 10*3/uL (ref 150–450)
RBC: 5.06 x10E6/uL (ref 3.77–5.28)
RDW: 12.9 % (ref 11.7–15.4)
WBC: 7.7 10*3/uL (ref 3.4–10.8)

## 2022-06-26 LAB — HCV INTERPRETATION

## 2022-06-26 LAB — CMP14+EGFR
ALT: 23 IU/L (ref 0–32)
AST: 27 IU/L (ref 0–40)
Albumin/Globulin Ratio: 1.3 (ref 1.2–2.2)
Albumin: 4.3 g/dL (ref 3.8–4.9)
Alkaline Phosphatase: 91 IU/L (ref 44–121)
BUN/Creatinine Ratio: 14 (ref 12–28)
BUN: 11 mg/dL (ref 8–27)
Bilirubin Total: 0.4 mg/dL (ref 0.0–1.2)
CO2: 23 mmol/L (ref 20–29)
Calcium: 9.2 mg/dL (ref 8.7–10.3)
Chloride: 103 mmol/L (ref 96–106)
Creatinine, Ser: 0.77 mg/dL (ref 0.57–1.00)
Globulin, Total: 3.3 g/dL (ref 1.5–4.5)
Glucose: 94 mg/dL (ref 70–99)
Potassium: 4 mmol/L (ref 3.5–5.2)
Sodium: 144 mmol/L (ref 134–144)
Total Protein: 7.6 g/dL (ref 6.0–8.5)
eGFR: 88 mL/min/{1.73_m2} (ref >59)

## 2022-06-26 LAB — LP+NON-HDL CHOLESTEROL
Cholesterol, Total: 271 mg/dL — ABNORMAL HIGH (ref 100–199)
HDL: 49 mg/dL (ref >39)
LDL Chol Calc (NIH): 196 mg/dL — ABNORMAL HIGH (ref 0–99)
Total Non-HDL-Chol (LDL+VLDL): 222 mg/dL — ABNORMAL HIGH (ref 0–129)
Triglycerides: 141 mg/dL (ref 0–149)
VLDL Cholesterol Cal: 26 mg/dL (ref 5–40)

## 2022-06-26 LAB — HEMOGLOBIN A1C
Est. average glucose Bld gHb Est-mCnc: 134 mg/dL
Hgb A1c MFr Bld: 6.3 % — ABNORMAL HIGH (ref 4.8–5.6)

## 2022-06-26 LAB — HCV AB W REFLEX TO QUANT PCR: HCV Ab: NONREACTIVE

## 2022-06-26 LAB — HIV ANTIBODY (ROUTINE TESTING W REFLEX): HIV Screen 4th Generation wRfx: NONREACTIVE

## 2022-06-26 MED ORDER — ATORVASTATIN CALCIUM 20 MG PO TABS
20.0000 mg | ORAL_TABLET | Freq: Every day | ORAL | 3 refills | Status: DC
Start: 2022-06-26 — End: 2022-10-01
  Filled 2022-06-26 – 2022-09-30 (×4): qty 30, 30d supply, fill #0

## 2022-07-02 ENCOUNTER — Ambulatory Visit (HOSPITAL_COMMUNITY): Payer: Self-pay

## 2022-07-02 ENCOUNTER — Other Ambulatory Visit: Payer: Self-pay

## 2022-07-02 ENCOUNTER — Ambulatory Visit (INDEPENDENT_AMBULATORY_CARE_PROVIDER_SITE_OTHER): Payer: Self-pay | Admitting: Physician Assistant

## 2022-07-02 ENCOUNTER — Encounter: Payer: Self-pay | Admitting: Physician Assistant

## 2022-07-02 ENCOUNTER — Ambulatory Visit (INDEPENDENT_AMBULATORY_CARE_PROVIDER_SITE_OTHER): Payer: Self-pay

## 2022-07-02 DIAGNOSIS — G8929 Other chronic pain: Secondary | ICD-10-CM

## 2022-07-02 DIAGNOSIS — M25512 Pain in left shoulder: Secondary | ICD-10-CM

## 2022-07-02 MED ORDER — BUPIVACAINE HCL 0.25 % IJ SOLN
2.0000 mL | INTRAMUSCULAR | Status: AC | PRN
  Administered 2022-07-02: 2 mL via INTRA_ARTICULAR

## 2022-07-02 MED ORDER — METHYLPREDNISOLONE ACETATE 40 MG/ML IJ SUSP
40.0000 mg | INTRAMUSCULAR | Status: AC | PRN
  Administered 2022-07-02: 40 mg via INTRA_ARTICULAR

## 2022-07-02 MED ORDER — LIDOCAINE HCL 2 % IJ SOLN
2.0000 mL | INTRAMUSCULAR | Status: AC | PRN
  Administered 2022-07-02: 2 mL

## 2022-07-02 NOTE — Progress Notes (Signed)
Office Visit Note   Patient: Connie Cook           Date of Birth: [birth year]           MRN: [id number] Visit Date: 07/02/2022              Requested by: Hoy Register, MD [address] [place],  [place] [address] PCP: Hoy Register, MD   Assessment & Plan: Visit Diagnoses:  1. Chronic left shoulder pain     Plan: Impression is left shoulder rotator cuff tendinitis and subacromial bursitis.  Today, we discussed proceeding with repeat subacromial cortisone injection.  If her symptoms do not improve over the next several weeks she will let us know we will get an MRI to reassess her rotator cuff.  Otherwise, follow-up with [place] as needed.  Follow-Up Instructions: Return if symptoms worsen or fail to improve.   Orders:  Orders Placed This Encounter  Procedures   Large Joint Inj: L subacromial bursa   XR Shoulder Left   No orders of the defined types were placed in this encounter.     Procedures: Large Joint Inj: L subacromial bursa on 07/02/2022 1:50 PM Indications: pain Details: 22 G needle Medications: 2 mL lidocaine 2 %; 2 mL bupivacaine 0.25 %; 40 mg methylPREDNISolone acetate 40 MG/ML Outcome: tolerated well, no immediate complications Patient was prepped and draped in the usual sterile fashion.       Clinical Data: No additional findings.   Subjective: Chief Complaint  Patient presents with   Left Shoulder - Pain    HPI patient is a pleasant 61 year old female who comes in today with left shoulder pain.  She has had intermittent pain for about 15 years.  She is originally seen by Dr. Thomasena Edis in 2009 where cortisone injection was performed.  She noticed good relief until her pain returned about 6 years or so ago.  She denies any injury or change in activity.  The pain she has is primarily to the deltoid.  It is now constant and worse with any motion of the shoulder.  She takes gabapentin and Robaxin as well as over-the-counter pain  medication without relief.  No recent cortisone injection.  Of note, she did undergo an MRI in 2016 of the left shoulder which showed moderate degenerative changes AC joint, mild degenerative changes the glenohumeral joint and rotator cuff tendinopathy.  No previous surgical invention.  Review of Systems as detailed in HPI.  All others reviewed and are negative.   Objective: Vital Signs: There were no vitals taken for this visit.  Physical Exam well-developed well-nourished female no acute distress.  Alert and oriented x3.  Ortho Exam left shoulder exam reveals forward flexion and abduction to approximately 110 degrees.  45 degrees of internal and external rotation.  Positive empty can test.  She does have full strength throughout.  Mild tenderness to the [facility] joint.  Specialty Comments:  No specialty comments available.  Imaging: XR Shoulder Left  Result Date: 07/02/2022 Moderate degenerative changes to the [facility] joint.  No superior migration humeral head.    PMFS History: Patient Active Problem List   Diagnosis Date Noted   Pure hypercholesterolemia 06/26/2022   Prediabetes 06/26/2022   Tendinopathy of left rotator cuff 06/25/2022   Weight gain 04/29/2022   Mild depression 09/24/2021   PTSD (post-traumatic stress disorder) 01/06/2021   Generalized anxiety disorder 01/06/2021   Major depressive disorder, recurrent episode, severe (HCC) 11/22/2020   Past Medical History:  Diagnosis Date   Anxiety    Arthritis    DDD (degenerative disc disease), cervical    Depression    H/O adenoidectomy    History of removal of ovarian cyst    Hyperlipidemia     Family History  Problem Relation Age of Onset   Depression Mother    Depression Father    Alcohol abuse Father    Alcohol abuse Maternal Uncle    Depression Paternal Uncle    Alcohol abuse Paternal Uncle    Depression Cousin     Past Surgical History:  Procedure Laterality Date   APPENDECTOMY     Social History    Occupational History   Not on file  Tobacco Use   Smoking status: Former   Smokeless tobacco: Former  Building services engineer Use: Never used  Substance and Sexual Activity   Alcohol use: Never   Drug use: Never   Sexual activity: Not on file

## 2022-07-23 ENCOUNTER — Encounter: Payer: Self-pay | Admitting: Registered"

## 2022-07-27 ENCOUNTER — Ambulatory Visit (INDEPENDENT_AMBULATORY_CARE_PROVIDER_SITE_OTHER): Payer: No Payment, Other | Admitting: Licensed Clinical Social Worker

## 2022-07-27 ENCOUNTER — Other Ambulatory Visit: Payer: Self-pay

## 2022-07-27 DIAGNOSIS — F411 Generalized anxiety disorder: Secondary | ICD-10-CM | POA: Diagnosis not present

## 2022-07-27 NOTE — Progress Notes (Signed)
Virtual Visit via Video Note  I connected with Connie Cook on 07/27/22 at  8:00 AM EDT by a video enabled telemedicine application and verified that I am speaking with the correct person using two identifiers.  Location: Patient: pt's home Provider: clinical office   I discussed the limitations of evaluation and management by telemedicine and the availability of in person appointments. The patient expressed understanding and agreed to proceed.   I discussed the assessment and treatment plan with the patient. The patient was provided an opportunity to ask questions and all were answered. The patient agreed with the plan and demonstrated an understanding of the instructions.   The patient was advised to call back or seek an in-person evaluation if the symptoms worsen or if the condition fails to improve as anticipated.  I provided 46 minutes of non-face-to-face time during this encounter.   Wyvonnia Lora, LCSWA    THERAPIST PROGRESS NOTE  Session Time: 46 minutes  Participation Level: Active  Behavioral Response: CasualAlertAnxious  Type of Therapy: Individual Therapy  Treatment Goals addressed: anxiety  ProgressTowards Goals: Progressing  Interventions: CBT and Supportive  Summary: Connie Cook is a 61 y.o. female who presents for f/u with this cln. She connects to the session on time and maintains good eye contact throughout. She reports she recently went to the doctor and is pre-diabetic, which is upsetting to her. She states she is not surprised due to how she eats and because of her weight. She states she is happy that she was able to catch it before becoming fully diabetic. She also states she really likes the doctor she is seeing. She also reports decreased cravings because of how she is eating now. She states she wants to start walking but needs to get sneakers first. She states her anxiety has been "[place]. It hasn't been bad. I haven't done anything  that causes anxiety." She states her sister will be going to the beach soon and she will be watching her puppy while she's gone. She reports she continues to work on negative self-talk and states while it has improved, she still struggles with it. She talks about how she connects to her beliefs. She then discusses the situations that make her anxious, such as meeting new people, making phone calls, being interrogated by her sister, and not having control. She also reports she continues to wait for SSD and discusses how anxiety has impacted her past jobs and current ability to work.   Suicidal/Homicidal: Nowithout intent/plan  Therapist Response: Cln assessed for current stressors, symptoms, and safety since last session. Cln utilized active listening and validation to assist with processing. Cln highlighted pt strengths and progress and briefly discussed exposure therapy as it relates to anxiety management, to be discussed further in future sessions. Cln confirmed next scheduled follow-up appointment and confirmed pt's availability and preferred method of service delivery (virtual).   Plan: Return again in 1 weeks.  Diagnosis: Generalized anxiety disorder  Collaboration of Care: Other none required for this visit  Patient/Guardian was advised Release of Information must be obtained prior to any record release in order to collaborate their care with an outside provider. Patient/Guardian was advised if they have not already done so to contact the registration department to sign all necessary forms in order for [place] to release information regarding their care.   Consent: Patient/Guardian gives verbal consent for treatment and assignment of benefits for services provided during this visit. Patient/Guardian expressed understanding and agreed to proceed.  Wyvonnia Lora, LCSWA 07/27/2022

## 2022-07-30 ENCOUNTER — Encounter (HOSPITAL_COMMUNITY): Payer: Self-pay | Admitting: Psychiatry

## 2022-07-30 ENCOUNTER — Telehealth (INDEPENDENT_AMBULATORY_CARE_PROVIDER_SITE_OTHER): Payer: No Payment, Other | Admitting: Psychiatry

## 2022-07-30 ENCOUNTER — Other Ambulatory Visit: Payer: Self-pay

## 2022-07-30 DIAGNOSIS — F411 Generalized anxiety disorder: Secondary | ICD-10-CM

## 2022-07-30 DIAGNOSIS — F32A Depression, unspecified: Secondary | ICD-10-CM | POA: Diagnosis not present

## 2022-07-30 MED ORDER — GABAPENTIN 300 MG PO CAPS
300.0000 mg | ORAL_CAPSULE | Freq: Three times a day (TID) | ORAL | 3 refills | Status: DC
  Filled 2022-07-30: qty 90, 30d supply, fill #0

## 2022-07-30 MED ORDER — ARIPIPRAZOLE 20 MG PO TABS
20.0000 mg | ORAL_TABLET | Freq: Every day | ORAL | 3 refills | Status: DC
  Filled 2022-07-30: qty 30, 30d supply, fill #0

## 2022-07-30 MED ORDER — SERTRALINE HCL 50 MG PO TABS
150.0000 mg | ORAL_TABLET | Freq: Every day | ORAL | 3 refills | Status: DC
  Filled 2022-07-30: qty 90, 30d supply, fill #0

## 2022-07-30 MED ORDER — HYDROXYZINE HCL 25 MG PO TABS
25.0000 mg | ORAL_TABLET | Freq: Three times a day (TID) | ORAL | 3 refills | Status: DC | PRN
  Filled 2022-07-30: qty 90, 30d supply, fill #0

## 2022-07-30 NOTE — Progress Notes (Signed)
BH MD/PA/NP OP Progress Note Virtual Visit via Telephone Note  I connected with Connie Cook on 07/30/22 at  3:00 PM EDT by telephone and verified that I am speaking with the correct person using two identifiers.  Location: Patient: home Provider: Clinic   I discussed the limitations, risks, security and privacy concerns of performing an evaluation and management service by telephone and the availability of in person appointments. I also discussed with the patient that there may be a patient responsible charge related to this service. The patient expressed understanding and agreed to proceed.   I provided 30 minutes of non-face-to-face time during this encounter.     07/30/2022 2:15 PM Connie Cook  MRN:  [id number]  Chief Complaint: "I am doing fine  HPI: 61 year old female seen today for follow up psychiatric evaluation. She has a psychiatric history of SI, depression, and anxiety.She is currently managed on Zoloft 150 mg daily, hydroxyzine 10 mg 3 times daily, gabapentin 300 mg 3 times daily and Abilify  20 mg daily. She notes that her  medications are effective in managing her psychiatric conditions.  Today she was unable to login virtually so her assessment was done over the phone.  During exam she was pleasant, cooperative, and engaged in conversation.  She informed Clinical research associate that since her last visit she has been doing fine.  She notes that she recently found out that she was prediabetic.  She informed Clinical research associate that she has changed her diet and has lost 10 pounds.  She also notes that she was recently started on Lipitor for her cholesterol.  Patient informed writer that her mood is stable and notes that she has minimal anxiety and depression.  Provider conducted a GAD-7 and patient scored an 8, at her last visit she scored an 11.  Provider also conducted PHQ-9 patient scored a 9, at her last visit she scored a 13.  She endorses sleeping 10 to 11 hours nightly.  She  reports that her appetite is adequate.  Today she denies SI/HI/AVH, mania, or paranoia.    Patient informed writer that she has shoulder, back, and knee pain.  She inform her that gabapentin is somewhat effective in managing her pain.  No medication changes made today.  Patient agreeable to continue medications as prescribed.  No other concerns at this time.        Visit Diagnosis:  No diagnosis found.   Past Psychiatric History:  SI, depression, and anxiety  Past Medical History:  Past Medical History:  Diagnosis Date   Anxiety    Arthritis    DDD (degenerative disc disease), cervical    Depression    H/O adenoidectomy    History of removal of ovarian cyst    Hyperlipidemia     Past Surgical History:  Procedure Laterality Date   APPENDECTOMY      Family Psychiatric History: Father Alcohol use, paternal aunt/uncle alcohol use   Family History:  Family History  Problem Relation Age of Onset   Depression Mother    Depression Father    Alcohol abuse Father    Alcohol abuse Maternal Uncle    Depression Paternal Uncle    Alcohol abuse Paternal Uncle    Depression Cousin     Social History:  Social History   Socioeconomic History   Marital status: Single    Spouse name: Not on file   Number of children: 0   Years of education: Not on file   Highest education level:  Some college, no degree  Occupational History   Not on file  Tobacco Use   Smoking status: Former   Smokeless tobacco: Former  Building services engineer Use: Never used  Substance and Sexual Activity   Alcohol use: Never   Drug use: Never   Sexual activity: Not on file  Other Topics Concern   Not on file  Social History Narrative   Not on file   Social Determinants of Health   Financial Resource Strain: Not on file  Food Insecurity: Not on file  Transportation Needs: Not on file  Physical Activity: Not on file  Stress: Not on file  Social Connections: Not on file    Allergies:   Allergies  Allergen Reactions   Codeine Shortness Of Breath   Penicillins Rash   Tramadol Itching    Metabolic Disorder Labs: Lab Results  Component Value Date   HGBA1C 6.3 (H) 06/25/2022   No results found for: "PROLACTIN" Lab Results  Component Value Date   CHOL 271 (H) 06/25/2022   TRIG 141 06/25/2022   HDL 49 06/25/2022   LDLCALC 196 (H) 06/25/2022   No results found for: "TSH"  Therapeutic Level Labs: No results found for: "LITHIUM" No results found for: "VALPROATE" No results found for: "CBMZ"  Current Medications: Current Outpatient Medications  Medication Sig Dispense Refill   ARIPiprazole (ABILIFY) 20 MG tablet Take 1 tablet (20 mg total) by mouth once daily. 30 tablet 3   atorvastatin (LIPITOR) 20 MG tablet Take 1 tablet (20 mg total) by mouth daily. 30 tablet 3   gabapentin (NEURONTIN) 300 MG capsule Take 1 capsule (300 mg total) by mouth 3 (three) times daily. 90 capsule 3   hydrOXYzine (ATARAX) 25 MG tablet Take 1 tablet (25 mg total) by mouth 3 (three) times daily as needed. 90 tablet 3   LORazepam (ATIVAN) 1 MG tablet Take 1 mg by mouth daily.     meloxicam (MOBIC) 7.5 MG tablet Take 1 tablet (7.5 mg total) by mouth daily. 30 tablet 1   methocarbamol (ROBAXIN-750) 750 MG tablet Take 1 tablet (750 mg total) by mouth every 8 (eight) hours as needed for muscle spasms. 90 tablet 1   Probiotic Product (PROBIOTIC DAILY PO) Take by mouth.     sertraline (ZOLOFT) 50 MG tablet Take 3 tablets (150 mg total) by mouth once once daily. 90 tablet 3   No current facility-administered medications for this visit.     Musculoskeletal: Strength & Muscle Tone:  unable to assess due to telephone visit Gait & Station:  Unable to assess due to telephone visit Patient leans: N/A  Psychiatric Specialty Exam: Review of Systems  There were no vitals taken for this visit.There is no height or weight on file to calculate BMI.  General Appearance:  Unable to assess due to  telephone visit  Eye Contact:   Unable to assess due to telephone visit  Speech:  Clear and Coherent and Slow  Volume:  Normal  Mood:  Euthymic  Affect:  Appropriate and Congruent  Thought Process:  Coherent, Goal Directed and Linear  Orientation:  Full (Time, Place, and Person)  Thought Content: WDL and Logical   Suicidal Thoughts:  No  Homicidal Thoughts:  No  Memory:  Immediate;   Good Recent;   Good Remote;   Good  Judgement:  Good  Insight:  Good  Psychomotor Activity:   Unable to assess due to telephone visit  Concentration:  Concentration: Good and Attention Span: Good  Recall:  [place] of Knowledge: Good  Language: Good  Akathisia:   Unable to assess due to telephone visit  Handed:  Right  AIMS (if indicated): Not done  Assets:  Communication Skills Desire for Improvement Financial Resources/Insurance Housing Social Support  ADL's:  Intact  Cognition: WNL  Sleep:  Good   Screenings: GAD-7    Flowsheet Row Office Visit from 06/25/2022 in [facility] Video Visit from 05/28/2022 in [facility] Video Visit from 04/29/2022 in [facility] Counselor from 02/23/2022 in [facility] Video Visit from 12/11/2021 in [facility]  Total GAD-7 Score 15 11 8 21 15       PHQ2-9    Flowsheet Row Office Visit from 06/25/2022 in [facility] Video Visit from 05/28/2022 in [facility] Counselor from 05/05/2022 in [facility] Video Visit from 04/29/2022 in [facility] Counselor from 04/20/2022 in [facility]  PHQ-2 Total Score 6 5 2 5  0  PHQ-9 Total Score 21 13 16 15 12       Flowsheet Row Video Visit from 05/28/2022 in [facility] Counselor from 04/03/2022 in [facility] Counselor from 02/23/2022 in [facility]  C-SSRS RISK CATEGORY Error: Question 6 not populated Error: Q3, 4, or 5 should not be populated when Q2 is No Error: Q3, 4, or 5 should not be populated when Q2 is No        Assessment and Plan: Patient reports that she  is doing well on her current medication regimen.  No medication changes made today.  Patient agreeable to continue medications as prescribed.    1. Mild depression  Continue- ARIPiprazole (ABILIFY) 20 MG tablet; Take 1 tablet (20 mg total) by mouth once daily.  Dispense: 30 tablet; Refill: 3 Continue- sertraline (ZOLOFT) 50 MG tablet; Take 3 tablets (150 mg total) by mouth once daily.  Dispense: 90 tablet; Refill: 3  2. Generalized anxiety disorder  Continue- hydrOXYzine (ATARAX) 25 MG tablet; Take 1 tablet (25 mg total) by mouth 3 (three) times daily as needed.  Dispense: 90 tablet; Refill: 3 Continue- gabapentin (NEURONTIN) 300 MG capsule; Take 1 capsule (300 mg total) by mouth 3 (three) times daily.  Dispense: 90 capsule; Refill: 3      Follow-up in 3 months Follow-up with therapy   [facility], NP 07/30/2022, 2:15 PM

## 2022-08-03 ENCOUNTER — Other Ambulatory Visit: Payer: Self-pay

## 2022-08-03 ENCOUNTER — Ambulatory Visit (INDEPENDENT_AMBULATORY_CARE_PROVIDER_SITE_OTHER): Payer: No Payment, Other | Admitting: Licensed Clinical Social Worker

## 2022-08-03 DIAGNOSIS — F411 Generalized anxiety disorder: Secondary | ICD-10-CM

## 2022-08-03 NOTE — Progress Notes (Signed)
Virtual Visit via Video Note  I connected with [place] on 08/03/22 at  8:00 AM EDT by a video enabled telemedicine application and verified that I am speaking with the correct person using two identifiers.  Location: Patient: pt's home Provider: clinical office   I discussed the limitations of evaluation and management by telemedicine and the availability of in person appointments. The patient expressed understanding and agreed to proceed.   I discussed the assessment and treatment plan with the patient. The patient was provided an opportunity to ask questions and all were answered. The patient agreed with the plan and demonstrated an understanding of the instructions.   The patient was advised to call back or seek an in-person evaluation if the symptoms worsen or if the condition fails to improve as anticipated.  I provided 32 minutes of non-face-to-face time during this encounter.   Heron Nay, LCSWA    THERAPIST PROGRESS NOTE  Session Time: 32 minutes  Participation Level: Active  Behavioral Response: CasualAlertAnxious  Type of Therapy: Individual Therapy  Treatment Goals addressed: anxiety  ProgressTowards Goals: Progressing  Interventions: CBT and Supportive  Summary: Connie Cook is a 61 y.o. female who presents for f/u with this cln. She joins the session on time and maintains good eye contact throughout. She reports she is doing okay but is feeling down because she feels guilty for not keeping in touch with a former client that she had grown close to. 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 She states when she explained to this woman why she was quitting, the woman left immediately and the few times pt has spoken to her since, the other woman has seemed to be "over it." She reports wanting closure. She shares how she has tried to reconnect and this person has not  reciprocated. She also reports she has lost 12 lbs since changing her diet. She continues to wait on a disability determination and estimates she will hear around December/January. She is receptive to feedback from cln.  Suicidal/Homicidal: Nowithout intent/plan  Therapist Response: Cln assessed for current stressors, symptoms, and safety since last session. Cln utilized active listening and validation to assist with processing. Cln challenged pt's thoughts surrounding pt's feelings of guilt surrounding friendship and facilitated reframing. Cln emailed pt link for resource on exposure therapy to cope with panic attacks. Cln scheduled follow-up appointment and confirmed pt's availability and preferred method of service delivery (virtual).   Plan: Return again in 5 weeks.  Diagnosis: Generalized anxiety disorder  Collaboration of Care: Other none required for this visit  Patient/Guardian was advised Release of Information must be obtained prior to any record release in order to collaborate their care with an outside provider. Patient/Guardian was advised if they have not already done so to contact the registration department to sign all necessary forms in order for [place] to release information regarding their care.   Consent: Patient/Guardian gives verbal consent for treatment and assignment of benefits for services provided during this visit. Patient/Guardian expressed understanding and agreed to proceed.   Heron Nay, LCSWA 08/03/2022

## 2022-08-06 ENCOUNTER — Other Ambulatory Visit: Payer: Self-pay

## 2022-08-14 NOTE — Psych (Signed)
Virtual Visit via Video Note  I connected with Connie Cook on 04/08/22 at  9:00 AM EDT by a video enabled telemedicine application and verified that I am speaking with the correct person using two identifiers.  Location: Patient: patient home Provider: clinical home office   I discussed the limitations of evaluation and management by telemedicine and the availability of in person appointments. The patient expressed understanding and agreed to proceed.  I discussed the assessment and treatment plan with the patient. The patient was provided an opportunity to ask questions and all were answered. The patient agreed with the plan and demonstrated an understanding of the instructions.   The patient was advised to call back or seek an in-person evaluation if the symptoms worsen or if the condition fails to improve as anticipated.  Pt was provided 240 minutes of non-face-to-face time during this encounter.   Lorin Glass, LCSW   [facility] PHP THERAPIST PROGRESS NOTE  Connie Cook [id number]  Session Time: 9:00 - 10:00  Participation Level: Active  Behavioral Response: CasualAlertDepressed  Type of Therapy: Group Therapy  Treatment Goals addressed: Coping  Progress Towards Goals: Progressing  Interventions: CBT, DBT, Supportive, and Reframing  Summary:  Clinician led check-in regarding current stressors and situation, and review of patient completed daily inventory. Clinician utilized active listening and empathetic response and validated patient emotions. Clinician facilitated processing group on pertinent issues.?    Summary: Connie Cook is a 61 y.o. female who presents with depression and anxiety symptoms.  Patient arrived within time allowed. Patient rates her mood at a 7 on a scale of 1-10 with 10 being best. Pt states she feels "okay." Pt reports she didn't sleep well due to her cat. Pt reports doing half of a chore task and cooking dinner. Pt  reports needing more than normal amounts of energy to complete tasks. Patient able to process. Patient engaged in discussion.        Session Time: 10:00 am - 11:00 am   Participation Level: Active   Behavioral Response: CasualAlertDepressed   Type of Therapy: Group Therapy   Treatment Goals addressed: Coping   Progress Towards Goals: Progressing   Interventions: CBT, DBT, Solution Focused, Strength-based, Supportive, and Reframing   Therapist Response:  Cln led processing group for pt's current struggles. Group members shared stressors and provided support and feedback. Cln brought in topics of boundaries, healthy relationships, and unhealthy thought processes to inform discussion.   Therapist Response: Pt able to process and provide support to group.            Session Time: 11:00 -12:00   Participation Level: Active   Behavioral Response: CasualAlertDepressed   Type of Therapy: Group Therapy, Spiritual Care   Treatment Goals addressed: Coping   Progress Towards Goals: Progressing   Interventions: Supportive, Education   Summary:  Alain Marion, Chaplain, led group.   Therapist Response: Pt participated        Session Time: 12:00 -1:00   Participation Level: Active   Behavioral Response: CasualAlertDepressed   Type of Therapy: Group Therapy, OT   Treatment Goals addressed: Coping   Progress Towards Goals: Progressing   Interventions: Supportive, Education   Summary:  OT, Cornell Barman, led group. 12:50 -1:00 Clinician led check-out. Clinician assessed for immediate needs, medication compliance and efficacy, and safety concerns   Therapist Response: 12:00 - 12:50: Pt participated 12:50 - 1:00 pm: At check-out, patient reports no immediate concerns. Patient demonstrates progress as evidenced by seeking social contact.  Patient denies SI/HI/self-harm thoughts at the end of group.   Suicidal/Homicidal: Nowithout intent/plan  Plan: Pt will continue in  PHP while working to decrease depression and anxiety symptoms, increase ADL's and increase ability to manage symptoms in a healthy manner.   Collaboration of Care: Medication Management AEB T Lewis  Patient/Guardian was advised Release of Information must be obtained prior to any record release in order to collaborate their care with an outside provider. Patient/Guardian was advised if they have not already done so to contact the registration department to sign all necessary forms in order for [place] to release information regarding their care.   Consent: Patient/Guardian gives verbal consent for treatment and assignment of benefits for services provided during this visit. Patient/Guardian expressed understanding and agreed to proceed.   Diagnosis: Severe episode of recurrent major depressive disorder, without psychotic features (HCC) [F33.2]    1. Severe episode of recurrent major depressive disorder, without psychotic features (HCC)   2. Generalized anxiety disorder      Donia Guiles, LCSW

## 2022-08-14 NOTE — Psych (Signed)
Virtual Visit via Video Note  I connected with Connie Cook on 04/21/22 at  9:00 AM EDT by a video enabled telemedicine application and verified that I am speaking with the correct person using two identifiers.  Location: Patient: patient home Provider: clinical home office   I discussed the limitations of evaluation and management by telemedicine and the availability of in person appointments. The patient expressed understanding and agreed to proceed.  I discussed the assessment and treatment plan with the patient. The patient was provided an opportunity to ask questions and all were answered. The patient agreed with the plan and demonstrated an understanding of the instructions.   The patient was advised to call back or seek an in-person evaluation if the symptoms worsen or if the condition fails to improve as anticipated.  Pt was provided 240 minutes of non-face-to-face time during this encounter.   Connie Guiles, LCSW   [facility] PHP THERAPIST PROGRESS NOTE  Connie Cook [id number]  Session Time: 9:00 - 10:00  Participation Level: Active  Behavioral Response: CasualAlertDepressed  Type of Therapy: Group Therapy  Treatment Goals addressed: Coping  Progress Towards Goals: Progressing  Interventions: CBT, DBT, Supportive, and Reframing  Summary:  Clinician led check-in regarding current stressors and situation, and review of patient completed daily inventory. Clinician utilized active listening and empathetic response and validated patient emotions. Clinician facilitated processing group on pertinent issues.?    Summary: Connie Cook is a 61 y.o. female who presents with depression and anxiety symptoms.  Patient arrived within time allowed. Patient rates her mood at a 6.5 on a scale of 1-10 with 10 being best. Pt states she feels "okay." Pt reports she did not sleep well and is in significant pain today. Pt reports reaching out to a friend and  finding it difficult to support with what mental reserves she has. Patient able to process. Patient engaged in discussion.        Session Time: 10:00 am - 11:00 am   Participation Level: Active   Behavioral Response: CasualAlertDepressed   Type of Therapy: Group Therapy   Treatment Goals addressed: Coping   Progress Towards Goals: Progressing   Interventions: CBT, DBT, Solution Focused, Strength-based, Supportive, and Reframing   Therapist Response: Cln led discussion on impulsivity. Group discussed struggles with impulsivity. Cln highlighted theme of immediacy. Group built insight around the way immediacy interacts with impulsivity. Cln encouraged pt's to consider mantras and grounding statements to remind themselves there is time to think/feel/act.    Therapist Response:  Pt engaged in discussion and is able to make connections and gain insight.            Session Time: 11:00 -12:00   Participation Level: Active   Behavioral Response: CasualAlertDepressed   Type of Therapy: Group Therapy, Occupational Therapy   Treatment Goals addressed: Coping   Progress Towards Goals: Progressing   Interventions: Supportive, Education   Summary:  Occupational Therapy group led by cln E. Hollan.   Therapist Response: Pt participated         Session Time: 12:00 -1:00   Participation Level: Active   Behavioral Response: CasualAlertDepressed   Type of Therapy: Group therapy   Treatment Goals addressed: Coping   Progress Towards Goals: Progressing   Interventions: CBT; Solution focused; Supportive; Reframing   Summary: 12:00 - 12:50: Cln led discussion on healthy relationships. Group members shared issues they have experienced in past relationships and in identifying what "healthy" looks like. Cln discussed respect, trust, and honesty  as non-negotiable traits in a healthy dynamic. Group shared and problem solved barriers to recognizing and priortizing these traits.  12:50  -1:00 Clinician led check-out. Clinician assessed for immediate needs, medication compliance and efficacy, and safety concerns.   Therapist Response: 12:00 - 12:50: Pt engaged in discussion and is able to process.  12:50 - 1:00 pm: At check-out, patient reports no immediate concerns. Patient demonstrates progress as evidenced by reaching out to social circle. Patient denies SI/HI/self-harm thoughts at the end of group.   Suicidal/Homicidal: Nowithout intent/plan  Plan: Pt will continue in PHP while working to decrease depression and anxiety symptoms, increase ADL's and increase ability to manage symptoms in a healthy manner.   Collaboration of Care: Medication Management AEB T Lewis  Patient/Guardian was advised Release of Information must be obtained prior to any record release in order to collaborate their care with an outside provider. Patient/Guardian was advised if they have not already done so to contact the registration department to sign all necessary forms in order for [place] to release information regarding their care.   Consent: Patient/Guardian gives verbal consent for treatment and assignment of benefits for services provided during this visit. Patient/Guardian expressed understanding and agreed to proceed.   Diagnosis: Severe episode of recurrent major depressive disorder, without psychotic features ([place]) [F33.2]    1. Severe episode of recurrent major depressive disorder, without psychotic features ([place])   2. Generalized anxiety disorder      Lorin Glass, LCSW

## 2022-08-14 NOTE — Psych (Signed)
Pt did not attend PHP as scheduled, reporting physical pain as a deterrent. Pt denies SI/HI.

## 2022-08-14 NOTE — Psych (Signed)
Virtual Visit via Video Note  I connected with Beckley on 04/17/22 at  9:00 AM EDT by a video enabled telemedicine application and verified that I am speaking with the correct person using two identifiers.  Location: Patient: patient home Provider: clinical home office   I discussed the limitations of evaluation and management by telemedicine and the availability of in person appointments. The patient expressed understanding and agreed to proceed.  I discussed the assessment and treatment plan with the patient. The patient was provided an opportunity to ask questions and all were answered. The patient agreed with the plan and demonstrated an understanding of the instructions.   The patient was advised to call back or seek an in-person evaluation if the symptoms worsen or if the condition fails to improve as anticipated.  Pt was provided 240 minutes of non-face-to-face time during this encounter.   Lorin Glass, LCSW   [facility] PHP THERAPIST PROGRESS NOTE  Yuka Lallier [id number]  Session Time: 9:00 - 10:00  Participation Level: Active  Behavioral Response: CasualAlertDepressed  Type of Therapy: Group Therapy  Treatment Goals addressed: Coping  Progress Towards Goals: Progressing  Interventions: CBT, DBT, Supportive, and Reframing  Summary:  Clinician led check-in regarding current stressors and situation, and review of patient completed daily inventory. Clinician utilized active listening and empathetic response and validated patient emotions. Clinician facilitated processing group on pertinent issues.?    Summary: Caterin Nature Kueker is a 61 y.o. female who presents with depression and anxiety symptoms.  Patient arrived within time allowed. Patient rates her mood at a 7 on a scale of 1-10 with 10 being best. Pt states she feels "pretty good." Pt reports her migraine is gone and she slept a little better. Pt reports she had a family event for a  birthday and pt struggled with social anxiety and feeling judged for her [place].  Patient able to process. Patient engaged in discussion.        Session Time: 10:00 am - 11:00 am   Participation Level: Active   Behavioral Response: CasualAlertDepressed   Type of Therapy: Group Therapy   Treatment Goals addressed: Coping   Progress Towards Goals: Progressing   Interventions: CBT, DBT, Solution Focused, Strength-based, Supportive, and Reframing   Therapist Response: Cln led discussion on planning ahead as a way to mitigate anxiety. Group members shared worries about keeping up good habits when returning to "normal" life post-treatment. Group able to brainstorm ways to build habits now and how they can fit into their life after treatment.   Therapist Response:  Pt engaged in discussion and identified ways to plan ahead for anxieties.            Session Time: 11:00 -12:00   Participation Level: Active   Behavioral Response: CasualAlertDepressed   Type of Therapy: Group Therapy, Occupational Therapy   Treatment Goals addressed: Coping   Progress Towards Goals: Progressing   Interventions: Supportive, Education   Summary:  Occupational Therapy group led by cln E. Hollan.   Therapist Response: Pt participated         Session Time: 12:00 -1:00   Participation Level: Active   Behavioral Response: CasualAlertDepressed   Type of Therapy: Group therapy   Treatment Goals addressed: Coping   Progress Towards Goals: Progressing   Interventions: CBT; Solution focused; Supportive; Reframing   Summary: 12:00 - 12:50: Cln led discussion on saying "no." Group members shared struggles they have with saying no and how it affects them. Cln utilized boundaries,  communication, and self-esteem tenets to inform discussion.  12:50 -1:00 Clinician led check-out. Clinician assessed for immediate needs, medication compliance and efficacy, and safety concerns.   Therapist Response: 12:00  - 12:50: Pt engaged in discussion and reports increased understanding of how to say "no."  12:50 - 1:00 pm: At check-out, patient reports no immediate concerns. Patient demonstrates progress as evidenced by attempting coping strategies. Patient denies SI/HI/self-harm thoughts at the end of group.   Suicidal/Homicidal: Nowithout intent/plan  Plan: Pt will continue in PHP while working to decrease depression and anxiety symptoms, increase ADL's and increase ability to manage symptoms in a healthy manner.   Collaboration of Care: Medication Management AEB T Lewis  Patient/Guardian was advised Release of Information must be obtained prior to any record release in order to collaborate their care with an outside provider. Patient/Guardian was advised if they have not already done so to contact the registration department to sign all necessary forms in order for [place] to release information regarding their care.   Consent: Patient/Guardian gives verbal consent for treatment and assignment of benefits for services provided during this visit. Patient/Guardian expressed understanding and agreed to proceed.   Diagnosis: Severe episode of recurrent major depressive disorder, without psychotic features (HCC) [F33.2]    1. Severe episode of recurrent major depressive disorder, without psychotic features (HCC)   2. Generalized anxiety disorder      Donia Guiles, LCSW

## 2022-08-14 NOTE — Psych (Signed)
Virtual Visit via Video Note  I connected with Lanya [name] Chisum on 04/20/22 at  9:00 AM EDT by a video enabled telemedicine application and verified that I am speaking with the correct person using two identifiers.  Location: Patient: patient home Provider: clinical home office   I discussed the limitations of evaluation and management by telemedicine and the availability of in person appointments. The patient expressed understanding and agreed to proceed.  I discussed the assessment and treatment plan with the patient. The patient was provided an opportunity to ask questions and all were answered. The patient agreed with the plan and demonstrated an understanding of the instructions.   The patient was advised to call back or seek an in-person evaluation if the symptoms worsen or if the condition fails to improve as anticipated.  Pt was provided 240 minutes of non-face-to-face time during this encounter.   Donia Guiles, LCSW   [facility] PHP THERAPIST PROGRESS NOTE  Elsey Eberl [id number]  Session Time: 9:00 - 10:00  Participation Level: Active  Behavioral Response: CasualAlertDepressed  Type of Therapy: Group Therapy  Treatment Goals addressed: Coping  Progress Towards Goals: Progressing  Interventions: CBT, DBT, Supportive, and Reframing  Summary:  Clinician led check-in regarding current stressors and situation, and review of patient completed daily inventory. Clinician utilized active listening and empathetic response and validated patient emotions. Clinician facilitated processing group on pertinent issues.?    Summary: Ritika Jackolyn Almendariz is a 61 y.o. female who presents with depression and anxiety symptoms.  Patient arrived within time allowed. Patient rates her mood at a 7 on a scale of 1-10 with 10 being best. Pt states she feels "pretty good." Pt reports she had a "burst of energy" on Saturday and was able to work on her to-do list. Pt states a  friend is having relationship problems and pt is worried about her which was preoccupying. Pt reports struggling with rumination about her body and weight.   Patient able to process. Patient engaged in discussion.        Session Time: 10:00 am - 11:00 am   Participation Level: Active   Behavioral Response: CasualAlertDepressed   Type of Therapy: Group Therapy   Treatment Goals addressed: Coping   Progress Towards Goals: Progressing   Interventions: CBT, DBT, Solution Focused, Strength-based, Supportive, and Reframing   Therapist Response: Cln led discussion on rumination: and how it can affect [place] negatively. Group members share topics, situations, and times in which rumination is most problem problematic for them. Cln encouraged pt's to consider DBT distraction and STOP skills or CBT thought challenging to manage rumination.    Therapist Response:  Pt engaged in discussion.           Session Time: 11:00 -12:00   Participation Level: Active   Behavioral Response: CasualAlertDepressed   Type of Therapy: Group Therapy, Occupational Therapy   Treatment Goals addressed: Coping   Progress Towards Goals: Progressing   Interventions: Supportive, Education   Summary:  Occupational Therapy group led by cln E. Hollan.   Therapist Response: Pt participated         Session Time: 12:00 -1:00   Participation Level: Active   Behavioral Response: CasualAlertDepressed   Type of Therapy: Group therapy   Treatment Goals addressed: Coping   Progress Towards Goals: Progressing   Interventions: CBT; Solution focused; Supportive; Reframing   Summary: 12:00 - 12:50: Cln continued topic of DBT distress tolerance skills. Cln introduced TIPP skills to use during cases of extreme emotion.  Group practiced deep breathing and progressive muscle relaxation together. Group discussed which situations they can apply TIPP skills.  12:50 -1:00 Clinician led check-out. Clinician assessed for  immediate needs, medication compliance and efficacy, and safety concerns.   Therapist Response: 12:00 - 12:50: Pt engaged in discussion and practiced with group.  12:50 - 1:00 pm: At check-out, patient reports no immediate concerns. Patient demonstrates progress as evidenced by increasing energy. Patient denies SI/HI/self-harm thoughts at the end of group.   Suicidal/Homicidal: Nowithout intent/plan  Plan: Pt will continue in PHP while working to decrease depression and anxiety symptoms, increase ADL's and increase ability to manage symptoms in a healthy manner.   Collaboration of Care: Medication Management AEB T Lewis  Patient/Guardian was advised Release of Information must be obtained prior to any record release in order to collaborate their care with an outside provider. Patient/Guardian was advised if they have not already done so to contact the registration department to sign all necessary forms in order for [place] to release information regarding their care.   Consent: Patient/Guardian gives verbal consent for treatment and assignment of benefits for services provided during this visit. Patient/Guardian expressed understanding and agreed to proceed.   Diagnosis: Severe episode of recurrent major depressive disorder, without psychotic features ([place]) [F33.2]    1. Severe episode of recurrent major depressive disorder, without psychotic features ([place])   2. Generalized anxiety disorder      Lorin Glass, LCSW

## 2022-08-14 NOTE — Psych (Signed)
Virtual Visit via Video Note  I connected with Connie Cook on 04/10/22 at  9:00 AM EDT by a video enabled telemedicine application and verified that I am speaking with the correct person using two identifiers.  Location: Patient: patient home Provider: clinical home office   I discussed the limitations of evaluation and management by telemedicine and the availability of in person appointments. The patient expressed understanding and agreed to proceed.  I discussed the assessment and treatment plan with the patient. The patient was provided an opportunity to ask questions and all were answered. The patient agreed with the plan and demonstrated an understanding of the instructions.   The patient was advised to call back or seek an in-person evaluation if the symptoms worsen or if the condition fails to improve as anticipated.  Pt was provided 240 minutes of non-face-to-face time during this encounter.   Lorin Glass, LCSW   [facility] PHP THERAPIST PROGRESS NOTE  Connie Cook [id number]  Session Time: 9:00 - 10:00  Participation Level: Active  Behavioral Response: CasualAlertDepressed  Type of Therapy: Group Therapy  Treatment Goals addressed: Coping  Progress Towards Goals: Progressing  Interventions: CBT, DBT, Supportive, and Reframing  Summary:  Clinician led check-in regarding current stressors and situation, and review of patient completed daily inventory. Clinician utilized active listening and empathetic response and validated patient emotions. Clinician facilitated processing group on pertinent issues.?    Summary: Connie Cook is a 61 y.o. female who presents with depression and anxiety symptoms.  Patient arrived within time allowed. Patient rates her mood at a 7 on a scale of 1-10 with 10 being best. Pt states she feels "middle of the road." Pt reports she is feeling better after yesterday's back pain. Pt states up and down depression and  anxiety yesterday and not sleeping well. Patient able to process. Patient engaged in discussion.        Session Time: 10:00 am - 11:00 am   Participation Level: Active   Behavioral Response: CasualAlertDepressed   Type of Therapy: Group Therapy   Treatment Goals addressed: Coping   Progress Towards Goals: Progressing   Interventions: CBT, DBT, Solution Focused, Strength-based, Supportive, and Reframing   Therapist Response: Cln led discussion on the ways our phones can cause barriers to our mental health. Group discussed struggles they have with their phones and the role it can play in unhealthy thought patterns and behaviors. Group worked to problem solve ways to cut down on negative behaviors.    Therapist Response: Pt engaged in discussion.         Session Time: 11:00 -12:00   Participation Level: Active   Behavioral Response: CasualAlertDepressed   Type of Therapy: Group Therapy   Treatment Goals addressed: Coping   Progress Towards Goals: Progressing   Interventions: CBT, DBT, Solution Focused, Strength-based, Supportive, and Reframing   Summary:  Cln led discussion on the connection between fear and anxiety. Cln contextualized anxiety and fear as both being future oriented and fed by the unknown. Group members shared ways in which fear interacts with their anxiety and the problems it creates. Cln encouraged CBT thought challenging and reframing as ways to address problematic fears and anxieties.    Therapist Response:  Pt engaged in discussion and is able to make connections between fear and anxiety.          Session Time: 12:00 -1:00   Participation Level: Active   Behavioral Response: CasualAlertDepressed   Type of Therapy: Group therapy  Treatment Goals addressed: Coping   Progress Towards Goals: Progressing   Interventions: CBT; Solution focused; Supportive; Reframing   Summary: 12:00 - 12:50: Cln led discussion on decision making and how to  apply logic to fears. Group viewed TED talk "Why you should define your fears not your goals" to aid discussion. Group discussed ways to consider how we can address fears and make them manageable.  12:50 - 1:00 pm: At check-out, patient reports no immediate concerns. Patient demonstrates progress as evidenced by having weekend plans. Patient denies SI/HI/self-harm thoughts at the end of group.   Suicidal/Homicidal: Nowithout intent/plan  Plan: Pt will continue in PHP while working to decrease depression and anxiety symptoms, increase ADL's and increase ability to manage symptoms in a healthy manner.   Collaboration of Care: Medication Management AEB T [place]  Patient/Guardian was advised Release of Information must be obtained prior to any record release in order to collaborate their care with an outside provider. Patient/Guardian was advised if they have not already done so to contact the registration department to sign all necessary forms in order for [place] to release information regarding their care.   Consent: Patient/Guardian gives verbal consent for treatment and assignment of benefits for services provided during this visit. Patient/Guardian expressed understanding and agreed to proceed.   Diagnosis: Severe episode of recurrent major depressive disorder, without psychotic features (HCC) [F33.2]    1. Severe episode of recurrent major depressive disorder, without psychotic features (HCC)   2. Generalized anxiety disorder   3. PTSD (post-traumatic stress disorder)      Donia Guiles, LCSW

## 2022-08-14 NOTE — Psych (Signed)
Virtual Visit via Video Note  I connected with Latrecia [name] Esty on 04/07/22 at  9:00 AM EDT by a video enabled telemedicine application and verified that I am speaking with the correct person using two identifiers.  Location: Patient: patient home Provider: clinical home office   I discussed the limitations of evaluation and management by telemedicine and the availability of in person appointments. The patient expressed understanding and agreed to proceed.  I discussed the assessment and treatment plan with the patient. The patient was provided an opportunity to ask questions and all were answered. The patient agreed with the plan and demonstrated an understanding of the instructions.   The patient was advised to call back or seek an in-person evaluation if the symptoms worsen or if the condition fails to improve as anticipated.  Pt was provided 240 minutes of non-face-to-face time during this encounter.   Lorin Glass, LCSW   [facility] PHP THERAPIST PROGRESS NOTE  Rose Beaulieu [id number]  Session Time: 9:00 - 10:00  Participation Level: Active  Behavioral Response: CasualAlertDepressed  Type of Therapy: Group Therapy  Treatment Goals addressed: Coping  Progress Towards Goals: Initial  Interventions: CBT, DBT, Supportive, and Reframing  Summary:  Clinician led check-in regarding current stressors and situation, and review of patient completed daily inventory. Clinician utilized active listening and empathetic response and validated patient emotions. Clinician facilitated processing group on pertinent issues.?    Summary: Kristelle Zariaha Goenner is a 61 y.o. female who presents with depression and anxiety symptoms.  Patient arrived within time allowed. Patient rates her mood at a 7 on a scale of 1-10 with 10 being best. Pt states she feels "not bad, not great." Pt reports she was able to complete an errand and saw a friend yesterday which improved her mood. Pt  reports struggles with feeling inadequate. Pt reports increased appetite and improving sleep with medication. Patient able to process. Patient engaged in discussion.        Session Time: 10:00 am - 11:00 am   Participation Level: Active   Behavioral Response: CasualAlertDepressed   Type of Therapy: Group Therapy   Treatment Goals addressed: Coping   Progress Towards Goals: Progressing   Interventions: CBT, DBT, Solution Focused, Strength-based, Supportive, and Reframing   Therapist Response: Cln continued topic of boundaries. Cln discussed emotional and intellectual boundaries including they way they present and difficulties with both. Group members discussed the ways in which emotional and intellectual boundaries is a struggle for them.    Therapist Response: Pt engaged in discussion and is able to identify ways in which emotional and intellectual boundaries affect them.           Session Time: 11:00 -12:00   Participation Level: Active   Behavioral Response: CasualAlertDepressed   Type of Therapy: Group Therapy, Occupational Therapy   Treatment Goals addressed: Coping   Progress Towards Goals: Progressing   Interventions: Supportive, Education   Summary:  Occupational Therapy group led by cln E. Hollan.   Therapist Response: Pt participated         Session Time: 12:00 -1:00   Participation Level: Active   Behavioral Response: CasualAlertDepressed   Type of Therapy: Group therapy   Treatment Goals addressed: Coping   Progress Towards Goals: Progressing   Interventions: CBT; Solution focused; Supportive; Reframing   Summary: 12:00 - 12:50: Cln continued topic of boundaries. Cln discussed material and time boundaries including they way they present and difficulties with both. Group members discussed the ways in which material  and time boundaries is a struggle for them.  12:50 -1:00 Clinician led check-out. Clinician assessed for immediate needs,  medication compliance and efficacy, and safety concerns.   Therapist Response: 12:00 - 12:50: Pt engaged in discussion and is able to identify ways in which material and time boundaries affect them. 12:50 - 1:00 pm: At check-out, patient reports no immediate concerns. Patient demonstrates progress as evidenced by participation in first group session. Patient denies SI/HI/self-harm thoughts at the end of group.   Suicidal/Homicidal: Nowithout intent/plan  Plan: Pt will continue in PHP while working to decrease depression and anxiety symptoms, increase ADL's and increase ability to manage symptoms in a healthy manner.   Collaboration of Care: Medication Management AEB T Lewis  Patient/Guardian was advised Release of Information must be obtained prior to any record release in order to collaborate their care with an outside provider. Patient/Guardian was advised if they have not already done so to contact the registration department to sign all necessary forms in order for [place] to release information regarding their care.   Consent: Patient/Guardian gives verbal consent for treatment and assignment of benefits for services provided during this visit. Patient/Guardian expressed understanding and agreed to proceed.   Diagnosis: Severe episode of recurrent major depressive disorder, without psychotic features ([place]) [F33.2]    1. Severe episode of recurrent major depressive disorder, without psychotic features ([place])   2. Generalized anxiety disorder      Lorin Glass, LCSW

## 2022-08-14 NOTE — Psych (Signed)
Virtual Visit via Video Note  I connected with [place] on 04/16/22 at  9:00 AM EDT by a video enabled telemedicine application and verified that I am speaking with the correct person using two identifiers.  Location: Patient: patient home Provider: clinical home office   I discussed the limitations of evaluation and management by telemedicine and the availability of in person appointments. The patient expressed understanding and agreed to proceed.  I discussed the assessment and treatment plan with the patient. The patient was provided an opportunity to ask questions and all were answered. The patient agreed with the plan and demonstrated an understanding of the instructions.   The patient was advised to call back or seek an in-person evaluation if the symptoms worsen or if the condition fails to improve as anticipated.  Pt was provided 240 minutes of non-face-to-face time during this encounter.   Lorin Glass, LCSW   [facility] PHP THERAPIST PROGRESS NOTE  Connie Cook [id number]  Session Time: 9:00 - 10:00  Participation Level: Active  Behavioral Response: CasualAlertDepressed  Type of Therapy: Group Therapy  Treatment Goals addressed: Coping  Progress Towards Goals: Progressing  Interventions: CBT, DBT, Supportive, and Reframing  Summary:  Clinician led check-in regarding current stressors and situation, and review of patient completed daily inventory. Clinician utilized active listening and empathetic response and validated patient emotions. Clinician facilitated processing group on pertinent issues.?    Summary: Connie Cook is a 61 y.o. female who presents with depression and anxiety symptoms.  Patient arrived within time allowed. Patient rates her mood at a 7 on a scale of 1-10 with 10 being best. Pt states she feels "not great." Pt reports she is dealing with a sinus migraine and does not feel well. Pt reports struggling with  self-esteem spiral re: her weight and it "plummeted" her mood. Pt reports poor sleep, with about 4 hours of broken sleep. Patient able to process. Patient engaged in discussion.        Session Time: 10:00 am - 11:00 am   Participation Level: Active   Behavioral Response: CasualAlertDepressed   Type of Therapy: Group Therapy   Treatment Goals addressed: Coping   Progress Towards Goals: Progressing   Interventions: CBT, DBT, Solution Focused, Strength-based, Supportive, and Reframing   Therapist Response: Cln led discussion on the 5 stages of grief and the way loss affects [place]. Cln encouraged pt to consider grief as a journey to accepting a new future. Cln created space for pt to process grief concerns and validated pt's experiences.    Therapist Response:  Pt engaged in discussion and was able to identify areas of loss and process.            Session Time: 11:00 -12:00   Participation Level: Active   Behavioral Response: CasualAlertDepressed   Type of Therapy: Group Therapy, Occupational Therapy   Treatment Goals addressed: Coping   Progress Towards Goals: Progressing   Interventions: Supportive, Education   Summary:  Occupational Therapy group led by cln E. Hollan.   Therapist Response: Pt participated         Session Time: 12:00 -1:00   Participation Level: Active   Behavioral Response: CasualAlertDepressed   Type of Therapy: Group therapy   Treatment Goals addressed: Coping   Progress Towards Goals: Progressing   Interventions: CBT; Solution focused; Supportive; Reframing   Summary: 12:00 - 12:50: Cln led discussion on "why" and "what now" in terms of how we focus on our problems. Group members shared  how focus on "why" has impacted them and barriers to focusing on "what now." Group able to process. Cln encouraged pt's to consider what "why" accomplishes.  12:50 -1:00 Clinician led check-out. Clinician assessed for immediate needs, medication compliance  and efficacy, and safety concerns.   Therapist Response: 12:00 - 12:50: Pt engaged in discussion. 12:50 - 1:00 pm: At check-out, patient reports no immediate concerns. Patient demonstrates progress as evidenced by discussing topics difficult for her. Patient denies SI/HI/self-harm thoughts at the end of group.   Suicidal/Homicidal: Nowithout intent/plan  Plan: Pt will continue in PHP while working to decrease depression and anxiety symptoms, increase ADL's and increase ability to manage symptoms in a healthy manner.   Collaboration of Care: Medication Management AEB T Lewis  Patient/Guardian was advised Release of Information must be obtained prior to any record release in order to collaborate their care with an outside provider. Patient/Guardian was advised if they have not already done so to contact the registration department to sign all necessary forms in order for [place] to release information regarding their care.   Consent: Patient/Guardian gives verbal consent for treatment and assignment of benefits for services provided during this visit. Patient/Guardian expressed understanding and agreed to proceed.   Diagnosis: Severe episode of recurrent major depressive disorder, without psychotic features (HCC) [F33.2]    1. Severe episode of recurrent major depressive disorder, without psychotic features (HCC)   2. Generalized anxiety disorder      Donia Guiles, LCSW

## 2022-08-15 NOTE — Psych (Signed)
Virtual Visit via Video Note  I connected with [place] on 04/28/22 at  9:00 AM EDT by a video enabled telemedicine application and verified that I am speaking with the correct person using two identifiers.  Location: Patient: patient home Provider: clinical home office   I discussed the limitations of evaluation and management by telemedicine and the availability of in person appointments. The patient expressed understanding and agreed to proceed.  I discussed the assessment and treatment plan with the patient. The patient was provided an opportunity to ask questions and all were answered. The patient agreed with the plan and demonstrated an understanding of the instructions.   The patient was advised to call back or seek an in-person evaluation if the symptoms worsen or if the condition fails to improve as anticipated.  Pt was provided 240 minutes of non-face-to-face time during this encounter.   Lorin Glass, LCSW   [facility] PHP THERAPIST PROGRESS NOTE  Nolie Bignell [id number]  Session Time: 9:00 - 10:00  Participation Level: Active  Behavioral Response: CasualAlertDepressed  Type of Therapy: Group Therapy  Treatment Goals addressed: Coping  Progress Towards Goals: Progressing  Interventions: CBT, DBT, Supportive, and Reframing  Summary: Clinician led check-in regarding current stressors and situation, and review of patient completed daily inventory. Clinician utilized active listening and empathetic response and validated patient emotions. Clinician facilitated processing group on pertinent issues.?    Summary: Barney Cathren Sween is a 61 y.o. female who presents with depression and anxiety symptoms.  Patient arrived within time allowed. Patient rates her mood at a 5 on a scale of 1-10 with 10 being best. Pt states she feels "not good." Pt reports her cold has gotten worse and she is mouth breathing and  sleeping poorly. Pt states she rested  throughout the day. Patient able to process. Patient engaged in discussion.        Session Time: 10:00 am - 11:00 am   Participation Level: Active   Behavioral Response: CasualAlertDepressed   Type of Therapy: Group Therapy   Treatment Goals addressed: Coping   Progress Towards Goals: Progressing   Interventions: CBT, DBT, Solution Focused, Strength-based, Supportive, and Reframing   Therapist Response: Cln led discussion on unrealistic expectations and the ways expectations can alter perspective. Group members discussed feelings and situations that have occurred due to expectation and how to know if they are unrealistic. Cln brought in CBT thought challenging and reframing to aid discussion.     Therapist Response: Pt engaged in discussion and reports gaining insight.          Session Time: 11:00 -12:00   Participation Level: Active   Behavioral Response: CasualAlertDepressed   Type of Therapy: Group Therapy, Occupational Therapy   Treatment Goals addressed: Coping   Progress Towards Goals: Progressing   Interventions: Supportive, Education   Summary:  Occupational Therapy group led by cln E. Hollan.   Therapist Response: Pt participated       Session Time: 12:00 -1:00   Participation Level: Active   Behavioral Response: CasualAlertDepressed   Type of Therapy: Group therapy   Treatment Goals addressed: Coping   Progress Towards Goals: Progressing   Interventions: CBT; Solution focused; Supportive; Reframing   Summary: 12:00 - 12:50: Cln continued topic of CBT cognitive distortions and utilized handout "Unhealthy Thought Patterns "to review common examples of distorted thought to increase awareness of the distorted thoughts. 12:50 -1:00 Clinician led check-out. Clinician assessed for immediate needs, medication compliance and efficacy, and safety concerns.  Therapist Response: 12:00 - 12:50: Pt engaged in discussion and is able to make connections from  her life to the distorted thoughts.   12:50 - 1:00 pm: At check-out, patient reports no immediate concerns. Patient demonstrates progress as evidenced by taking care of her illness. Patient denies SI/HI/self-harm thoughts at the end of group.   Suicidal/Homicidal: Nowithout intent/plan  Plan: Pt will continue in PHP while working to decrease depression and anxiety symptoms, increase ADL's and increase ability to manage symptoms in a healthy manner.   Collaboration of Care: Medication Management AEB T Lewis  Patient/Guardian was advised Release of Information must be obtained prior to any record release in order to collaborate their care with an outside provider. Patient/Guardian was advised if they have not already done so to contact the registration department to sign all necessary forms in order for [place] to release information regarding their care.   Consent: Patient/Guardian gives verbal consent for treatment and assignment of benefits for services provided during this visit. Patient/Guardian expressed understanding and agreed to proceed.   Diagnosis: Severe episode of recurrent major depressive disorder, without psychotic features (HCC) [F33.2]    1. Severe episode of recurrent major depressive disorder, without psychotic features (HCC)   2. Generalized anxiety disorder      Donia Guiles, LCSW

## 2022-08-15 NOTE — Psych (Signed)
Virtual Visit via Video Note  I connected with Connie Cook on 04/23/22 at  9:00 AM EDT by a video enabled telemedicine application and verified that I am speaking with the correct person using two identifiers.  Location: Patient: patient home Provider: clinical home office   I discussed the limitations of evaluation and management by telemedicine and the availability of in person appointments. The patient expressed understanding and agreed to proceed.  I discussed the assessment and treatment plan with the patient. The patient was provided an opportunity to ask questions and all were answered. The patient agreed with the plan and demonstrated an understanding of the instructions.   The patient was advised to call back or seek an in-person evaluation if the symptoms worsen or if the condition fails to improve as anticipated.  Pt was provided 240 minutes of non-face-to-face time during this encounter.   Lorin Glass, LCSW   [facility] PHP THERAPIST PROGRESS NOTE  Connie Cook [id number]  Session Time: 9:00 - 10:00  Participation Level: Active  Behavioral Response: CasualAlertDepressed  Type of Therapy: Group Therapy  Treatment Goals addressed: Coping  Progress Towards Goals: Progressing  Interventions: CBT, DBT, Supportive, and Reframing  Summary:  Clinician led check-in regarding current stressors and situation, and review of patient completed daily inventory. Clinician utilized active listening and empathetic response and validated patient emotions. Clinician facilitated processing group on pertinent issues.?    Summary: Connie Cook is a 61 y.o. female who presents with depression and anxiety symptoms.  Patient arrived within time allowed. Patient rates her mood at a 6 on a scale of 1-10 with 10 being best. Pt states she feels "okay." Pt reports experiencing breathing issues due to air quality. Pt states rumination and negative thinking re: her  weight yesterday and it  "put me in a funk." Pt struggles to apply coping skills to negative thinking. Patient able to process. Patient engaged in discussion.        Session Time: 10:00 am - 11:00 am   Participation Level: Active   Behavioral Response: CasualAlertDepressed   Type of Therapy: Group Therapy   Treatment Goals addressed: Coping   Progress Towards Goals: Progressing   Interventions: CBT, DBT, Solution Focused, Strength-based, Supportive, and Reframing   Therapist Response: Cln led discussion on feelings and the role they play for our lives. Cln contextualized feelings as a warning system that brings attention to areas of our lives that need focus. Group members discussed how to pay attention to what the feeling is telling us versus reacting to unpleasant aspects of a feeling.    Therapist Response:  Pt engaged in discussion and reports understanding.            Session Time: 11:00 -12:00   Participation Level: Active   Behavioral Response: CasualAlertDepressed   Type of Therapy: Group Therapy, Occupational Therapy   Treatment Goals addressed: Coping   Progress Towards Goals: Progressing   Interventions: Supportive, Education   Summary:  Occupational Therapy group led by cln E. Hollan.   Therapist Response: Pt participated         Session Time: 12:00 -1:00   Participation Level: Active   Behavioral Response: CasualAlertDepressed   Type of Therapy: Group therapy   Treatment Goals addressed: Coping   Progress Towards Goals: Progressing   Interventions: CBT; Solution focused; Supportive; Reframing   Summary: 12:00 - 12:50: Cln introduced CBT and the way in which it can provide context for addressing stumbling blocks. Group discussed "the problem  is not the problem, the problem is how we're thinking about the problem" and tried to change perspective on current struggles.  12:50 -1:00 Clinician led check-out. Clinician assessed for immediate needs,  medication compliance and efficacy, and safety concerns.   Therapist Response: 12:00 - 12:50: Pt engaged in discussion and is able to attempt reframing using CBT. 12:50 - 1:00 pm: At check-out, patient reports no immediate concerns. Patient demonstrates progress as evidenced by increased stability. Patient denies SI/HI/self-harm thoughts at the end of group.   Suicidal/Homicidal: Nowithout intent/plan  Plan: Pt will continue in PHP while working to decrease depression and anxiety symptoms, increase ADL's and increase ability to manage symptoms in a healthy manner.   Collaboration of Care: Medication Management AEB T Lewis  Patient/Guardian was advised Release of Information must be obtained prior to any record release in order to collaborate their care with an outside provider. Patient/Guardian was advised if they have not already done so to contact the registration department to sign all necessary forms in order for [place] to release information regarding their care.   Consent: Patient/Guardian gives verbal consent for treatment and assignment of benefits for services provided during this visit. Patient/Guardian expressed understanding and agreed to proceed.   Diagnosis: Severe episode of recurrent major depressive disorder, without psychotic features (HCC) [F33.2]    1. Severe episode of recurrent major depressive disorder, without psychotic features (HCC)   2. Generalized anxiety disorder      Donia Guiles, LCSW

## 2022-08-15 NOTE — Psych (Signed)
Virtual Visit via Video Note  I connected with Connie Cook on 04/22/22 at  9:00 AM EDT by a video enabled telemedicine application and verified that I am speaking with the correct person using two identifiers.  Location: Patient: patient home Provider: clinical home office   I discussed the limitations of evaluation and management by telemedicine and the availability of in person appointments. The patient expressed understanding and agreed to proceed.  I discussed the assessment and treatment plan with the patient. The patient was provided an opportunity to ask questions and all were answered. The patient agreed with the plan and demonstrated an understanding of the instructions.   The patient was advised to call back or seek an in-person evaluation if the symptoms worsen or if the condition fails to improve as anticipated.  Pt was provided 240 minutes of non-face-to-face time during this encounter.   Lorin Glass, LCSW   [facility] PHP THERAPIST PROGRESS NOTE  Connie Cook [id number]  Session Time: 9:00 - 10:00  Participation Level: Active  Behavioral Response: CasualAlertDepressed  Type of Therapy: Group Therapy  Treatment Goals addressed: Coping  Progress Towards Goals: Progressing  Interventions: CBT, DBT, Supportive, and Reframing  Summary:  Clinician led check-in regarding current stressors and situation, and review of patient completed daily inventory. Clinician utilized active listening and empathetic response and validated patient emotions. Clinician facilitated processing group on pertinent issues.?    Summary: Connie Cook is a 61 y.o. female who presents with depression and anxiety symptoms.  Patient arrived within time allowed. Patient rates her mood at a 7 on a scale of 1-10 with 10 being best. Pt states she feels "okay." Pt reports her pain is better today and was able to sleep more last night. Pt states yesterday was "uneventful."  Pt reports anxiety regarding a social event this weekend. Patient able to process. Patient engaged in discussion.        Session Time: 10:00 am - 11:00 am   Participation Level: Active   Behavioral Response: CasualAlertDepressed   Type of Therapy: Group Therapy   Treatment Goals addressed: Coping   Progress Towards Goals: Progressing   Interventions: CBT, DBT, Solution Focused, Strength-based, Supportive, and Reframing   Therapist Response: Cln led processing group for pt's current struggles. Group members shared stressors and provided support and feedback. Cln brought in topics of boundaries, healthy relationships, and unhealthy thought processes to inform discussion.   Therapist Response: Pt able to process and provide support to group.            Session Time: 11:00 -12:00   Participation Level: Active   Behavioral Response: CasualAlertDepressed   Type of Therapy: Group Therapy, Spiritual Care   Treatment Goals addressed: Coping   Progress Towards Goals: Progressing   Interventions: Supportive, Education   Summary:  Connie Cook, Chaplain, led group.   Therapist Response: Pt participated        Session Time: 12:00 -1:00   Participation Level: Active   Behavioral Response: CasualAlertDepressed   Type of Therapy: Group Therapy   Treatment Goals addressed: Coping   Progress Towards Goals: Progressing   Interventions: CBT, DBT, Solution Focused, Strength-based, Supportive, and Reframing   Summary: Cln introduced topic of DBT Self-Soothe skills. Group discussed ways they can utilize the five senses to soothe themselves when struggling.  12:50 -1:00 Clinician led check-out. Clinician assessed for immediate needs, medication compliance and efficacy, and safety concerns   Therapist Response: 12:00 - 12:50: Pt engaged in discussion and is  able to determine ways to utilize each of the five senses. 12:50 - 1:00 pm: At check-out, patient reports no immediate  concerns. Patient demonstrates progress as evidenced by increased willingness. Patient denies SI/HI/self-harm thoughts at the end of group.   Suicidal/Homicidal: Nowithout intent/plan  Plan: Pt will continue in PHP while working to decrease depression and anxiety symptoms, increase ADL's and increase ability to manage symptoms in a healthy manner.   Collaboration of Care: Medication Management AEB T Lewis  Patient/Guardian was advised Release of Information must be obtained prior to any record release in order to collaborate their care with an outside provider. Patient/Guardian was advised if they have not already done so to contact the registration department to sign all necessary forms in order for [place] to release information regarding their care.   Consent: Patient/Guardian gives verbal consent for treatment and assignment of benefits for services provided during this visit. Patient/Guardian expressed understanding and agreed to proceed.   Diagnosis: Severe episode of recurrent major depressive disorder, without psychotic features ([place]) [F33.2]    1. Severe episode of recurrent major depressive disorder, without psychotic features ([place])   2. Generalized anxiety disorder      Lorin Glass, LCSW

## 2022-08-15 NOTE — Psych (Signed)
Virtual Visit via Video Note  I connected with Connie Cook on 04/27/22 at  9:00 AM EDT by a video enabled telemedicine application and verified that I am speaking with the correct person using two identifiers.  Location: Patient: patient home Provider: clinical home office   I discussed the limitations of evaluation and management by telemedicine and the availability of in person appointments. The patient expressed understanding and agreed to proceed.  I discussed the assessment and treatment plan with the patient. The patient was provided an opportunity to ask questions and all were answered. The patient agreed with the plan and demonstrated an understanding of the instructions.   The patient was advised to call back or seek an in-person evaluation if the symptoms worsen or if the condition fails to improve as anticipated.  Pt was provided 240 minutes of non-face-to-face time during this encounter.   Connie Guiles, LCSW   [facility] PHP THERAPIST PROGRESS NOTE  Connie Cook [id number]  Session Time: 9:00 - 10:00  Participation Level: Active  Behavioral Response: CasualAlertDepressed  Type of Therapy: Group Therapy  Treatment Goals addressed: Coping  Progress Towards Goals: Progressing  Interventions: CBT, DBT, Supportive, and Reframing  Summary: Clinician led check-in regarding current stressors and situation, and review of patient completed daily inventory. Clinician utilized active listening and empathetic response and validated patient emotions. Clinician facilitated processing group on pertinent issues.?    Summary: Connie Cook is a 61 y.o. female who presents with depression and anxiety symptoms.  Patient arrived within time allowed. Patient rates her mood at a 5 on a scale of 1-10 with 10 being best. Pt states she feels "tired." Pt reports she spent the weekend dog sitting and stayed in. Pt states struggling with a sinus cold that kept her  low. Patient able to process. Patient engaged in discussion.        Session Time: 10:00 am - 11:00 am   Participation Level: Active   Behavioral Response: CasualAlertDepressed   Type of Therapy: Group Therapy   Treatment Goals addressed: Coping   Progress Towards Goals: Progressing   Interventions: CBT, DBT, Solution Focused, Strength-based, Supportive, and Reframing   Therapist Response: Cln led discussion on personal standards and they way in which it impacts the way we view ourselves and our abilities. Group members discussed judgment, struggles, and barriers they experience in terms of personal standards. Cln brought in topics of balance, grace, and kindness. Cln proposed the "best friend test" as a way to calibrate whether we are viewing our situation with kindness or harshness.     Therapist Response: Pt engaged in discussion and reports willingness to utilize the best friend test.          Session Time: 11:00 -12:00   Participation Level: Active   Behavioral Response: CasualAlertDepressed   Type of Therapy: Group Therapy, Occupational Therapy   Treatment Goals addressed: Coping   Progress Towards Goals: Progressing   Interventions: Supportive, Education   Summary:  Occupational Therapy group led by cln E. Hollan.   Therapist Response: Pt participated       Session Time: 12:00 -1:00   Participation Level: Active   Behavioral Response: CasualAlertDepressed   Type of Therapy: Group therapy   Treatment Goals addressed: Coping   Progress Towards Goals: Progressing   Interventions: CBT; Solution focused; Supportive; Reframing   Summary: 12:00 - 12:50: Cln introduced topic of CBT cognitive distortions. Cln discussed unhealthy thought patterns and how our thoughts shape our reality and irrational  thoughts can alter our perspective. Cln utilized handout "cognitive distortions" to discuss common examples of distorted thoughts and group members worked to  identify examples in their own life.  12:50 -1:00 Clinician led check-out. Clinician assessed for immediate needs, medication compliance and efficacy, and safety concerns.   Therapist Response: 12:00 - 12:50: Pt engaged in discussion and is able to determine examples of distorted thinking in their own life.  12:50 - 1:00 pm: At check-out, patient reports no immediate concerns. Patient demonstrates progress as evidenced by increasing ADLs. Patient denies SI/HI/self-harm thoughts at the end of group.   Suicidal/Homicidal: Nowithout intent/plan  Plan: Pt will continue in PHP while working to decrease depression and anxiety symptoms, increase ADL's and increase ability to manage symptoms in a healthy manner.   Collaboration of Care: Medication Management AEB T Lewis  Patient/Guardian was advised Release of Information must be obtained prior to any record release in order to collaborate their care with an outside provider. Patient/Guardian was advised if they have not already done so to contact the registration department to sign all necessary forms in order for [place] to release information regarding their care.   Consent: Patient/Guardian gives verbal consent for treatment and assignment of benefits for services provided during this visit. Patient/Guardian expressed understanding and agreed to proceed.   Diagnosis: Severe episode of recurrent major depressive disorder, without psychotic features ([place]) [F33.2]    1. Severe episode of recurrent major depressive disorder, without psychotic features ([place])   2. Generalized anxiety disorder      Connie Glass, LCSW

## 2022-08-15 NOTE — Psych (Signed)
Virtual Visit via Video Note  I connected with Virlee [name] Bowring on 04/30/22 at  9:00 AM EDT by a video enabled telemedicine application and verified that I am speaking with the correct person using two identifiers.  Location: Patient: patient home Provider: clinical home office   I discussed the limitations of evaluation and management by telemedicine and the availability of in person appointments. The patient expressed understanding and agreed to proceed.  I discussed the assessment and treatment plan with the patient. The patient was provided an opportunity to ask questions and all were answered. The patient agreed with the plan and demonstrated an understanding of the instructions.   The patient was advised to call back or seek an in-person evaluation if the symptoms worsen or if the condition fails to improve as anticipated.  Pt was provided 240 minutes of non-face-to-face time during this encounter.   Donia Guiles, LCSW   [facility] PHP THERAPIST PROGRESS NOTE  Atisha Hamidi [id number]  Session Time: 9:00 - 10:00  Participation Level: Active  Behavioral Response: CasualAlertDepressed  Type of Therapy: Group Therapy  Treatment Goals addressed: Coping  Progress Towards Goals: Progressing  Interventions: CBT, DBT, Supportive, and Reframing  Summary: Clinician led check-in regarding current stressors and situation, and review of patient completed daily inventory. Clinician utilized active listening and empathetic response and validated patient emotions. Clinician facilitated processing group on pertinent issues.?    Summary: Karine Guillermo Difrancesco is a 61 y.o. female who presents with depression and anxiety symptoms.  Patient arrived within time allowed. Patient rates her mood at a 6 on a scale of 1-10 with 10 being best. Pt states she feels "okay." Pt reports continuing to fight her cold and sleeping poorly. Pt reports feeling unable to manage feelings and self  esteem. Pt reports easily spiraling.  Patient able to process. Patient engaged in discussion.        Session Time: 10:00 am - 11:00 am   Participation Level: Active   Behavioral Response: CasualAlertDepressed   Type of Therapy: Group Therapy   Treatment Goals addressed: Coping   Progress Towards Goals: Progressing   Interventions: CBT, DBT, Solution Focused, Strength-based, Supportive, and Reframing   Therapist Response: Cln led discussion on safety and the ways in which we can seek and own it in ourselves. Group members shared how they view safety and situations which hinder safety. Cln encouraged pt's to think about emotional safety and ways to foster it.     Therapist Response: Pt engaged in discussion and reports gaining insight.          Session Time: 11:00 -12:00   Participation Level: Active   Behavioral Response: CasualAlertDepressed   Type of Therapy: Group Therapy, Occupational Therapy   Treatment Goals addressed: Coping   Progress Towards Goals: Progressing   Interventions: Supportive, Education   Summary:  Occupational Therapy group led by cln E. Hollan.   Therapist Response: Pt participated       Session Time: 12:00 -1:00   Participation Level: Active   Behavioral Response: CasualAlertDepressed   Type of Therapy: Group therapy   Treatment Goals addressed: Coping   Progress Towards Goals: Progressing   Interventions: CBT; Solution focused; Supportive; Reframing   Summary: 12:00 - 12:50: Cln continued topic of CBT cognitive distortions and introduced thought challenging as a way to  utilize the "challenge" C in C-C-C. Group utilized Administrator, Civil Service questions" as a way to introduce challenges and reframe distorted thinking. Group members worked through pt examples to Financial risk analyst  challenging distorted thinking.  12:50 -1:00 Clinician led check-out. Clinician assessed for immediate needs, medication compliance and efficacy, and safety concerns.    Therapist Response: 12:00 - 12:50: Pt engaged in discussion and demonstrates understanding of challenging distorted thoughts through practice.  12:50 - 1:00 pm: At check-out, patient reports no immediate concerns. Patient demonstrates progress as evidenced by increased depth of sharing. Patient denies SI/HI/self-harm thoughts at the end of group.   Suicidal/Homicidal: Nowithout intent/plan  Plan: Pt will continue in PHP while working to decrease depression and anxiety symptoms, increase ADL's and increase ability to manage symptoms in a healthy manner.   Collaboration of Care: Medication Management AEB T Lewis  Patient/Guardian was advised Release of Information must be obtained prior to any record release in order to collaborate their care with an outside provider. Patient/Guardian was advised if they have not already done so to contact the registration department to sign all necessary forms in order for [place] to release information regarding their care.   Consent: Patient/Guardian gives verbal consent for treatment and assignment of benefits for services provided during this visit. Patient/Guardian expressed understanding and agreed to proceed.   Diagnosis: Severe episode of recurrent major depressive disorder, without psychotic features ([place]) [F33.2]    1. Severe episode of recurrent major depressive disorder, without psychotic features ([place])   2. Generalized anxiety disorder      Lorin Glass, LCSW

## 2022-08-15 NOTE — Psych (Signed)
Virtual Visit via Video Note  I connected with [place] on 04/24/22 at  9:00 AM EDT by a video enabled telemedicine application and verified that I am speaking with the correct person using two identifiers.  Location: Patient: patient home Provider: clinical home office   I discussed the limitations of evaluation and management by telemedicine and the availability of in person appointments. The patient expressed understanding and agreed to proceed.  I discussed the assessment and treatment plan with the patient. The patient was provided an opportunity to ask questions and all were answered. The patient agreed with the plan and demonstrated an understanding of the instructions.   The patient was advised to call back or seek an in-person evaluation if the symptoms worsen or if the condition fails to improve as anticipated.  Pt was provided 240 minutes of non-face-to-face time during this encounter.   Lorin Glass, LCSW   [facility] PHP THERAPIST PROGRESS NOTE  Connie Cook [id number]  Session Time: 9:00 - 10:00  Participation Level: Active  Behavioral Response: CasualAlertDepressed  Type of Therapy: Group Therapy  Treatment Goals addressed: Coping  Progress Towards Goals: Progressing  Interventions: CBT, DBT, Supportive, and Reframing  Summary: Clinician led check-in regarding current stressors and situation, and review of patient completed daily inventory. Clinician utilized active listening and empathetic response and validated patient emotions. Clinician facilitated processing group on pertinent issues.?    Summary: Connie Cook is a 61 y.o. female who presents with depression and anxiety symptoms.  Patient arrived within time allowed. Patient rates her mood at a 6 on a scale of 1-10 with 10 being best. Pt states she feels "okay." Pt reports increased pain yesterday and it negatively impacting her sleep. Pt reports ruminating on her weight and  negative thoughts about her body. Pt struggles to apply coping skills to negative thinking, however reports she was able to notice the spiral eventually and turn away from the thoughts. Patient able to process. Patient engaged in discussion.        Session Time: 10:00 am - 11:00 am   Participation Level: Active   Behavioral Response: CasualAlertDepressed   Type of Therapy: Group Therapy   Treatment Goals addressed: Coping   Progress Towards Goals: Progressing   Interventions: CBT, DBT, Solution Focused, Strength-based, Supportive, and Reframing   Therapist Response: Cln led discussion on family dynamics and the way in which they impact [place]. Group members shared struggles with their families and the way the patterns of behaviors have negatively impacted them. Cln provided space to process and validated pt's experiences.     Therapist Response:  Pt engaged in discussion and is able to process.         Session Time: 11:00 -12:00   Participation Level: Active   Behavioral Response: CasualAlertDepressed   Type of Therapy: Group Therapy, Occupational Therapy   Treatment Goals addressed: Coping   Progress Towards Goals: Progressing   Interventions: Supportive, Education   Summary:  Occupational Therapy group led by cln E. Hollan.   Therapist Response: Pt participated         Session Time: 12:00 -1:00   Participation Level: Active   Behavioral Response: CasualAlertDepressed   Type of Therapy: Group therapy   Treatment Goals addressed: Coping   Progress Towards Goals: Progressing   Interventions: CBT; Solution focused; Supportive; Reframing   Summary: 12:00 - 12:50: Cln introduced DBT concept of radical acceptance. Cln discussed radical acceptance as a strategy to decrease distress and to manage situations  outside of their control. Group volunteered struggles they are experiencing with control and cln helped group apply radical acceptance.  12:50 -1:00 Clinician  led check-out. Clinician assessed for immediate needs, medication compliance and efficacy, and safety concerns.   Therapist Response: 12:00 - 12:50: Pt engaged in discussion and identified ways they can apply radical acceptance in their life.  12:50 - 1:00 pm: At check-out, patient reports no immediate concerns. Patient demonstrates progress as evidenced by efforts to stop negative thinking. Patient denies SI/HI/self-harm thoughts at the end of group.   Suicidal/Homicidal: Nowithout intent/plan  Plan: Pt will continue in PHP while working to decrease depression and anxiety symptoms, increase ADL's and increase ability to manage symptoms in a healthy manner.   Collaboration of Care: Medication Management AEB T [place]  Patient/Guardian was advised Release of Information must be obtained prior to any record release in order to collaborate their care with an outside provider. Patient/Guardian was advised if they have not already done so to contact the registration department to sign all necessary forms in order for [place] to release information regarding their care.   Consent: Patient/Guardian gives verbal consent for treatment and assignment of benefits for services provided during this visit. Patient/Guardian expressed understanding and agreed to proceed.   Diagnosis: Severe episode of recurrent major depressive disorder, without psychotic features ([place]) [F33.2]    1. Severe episode of recurrent major depressive disorder, without psychotic features ([place])   2. PTSD (post-traumatic stress disorder)   3. Generalized anxiety disorder      Lorin Glass, LCSW

## 2022-08-28 ENCOUNTER — Encounter (HOSPITAL_COMMUNITY): Payer: Self-pay | Admitting: Psychiatry

## 2022-08-28 ENCOUNTER — Other Ambulatory Visit: Payer: Self-pay

## 2022-08-28 ENCOUNTER — Telehealth (INDEPENDENT_AMBULATORY_CARE_PROVIDER_SITE_OTHER): Payer: No Payment, Other | Admitting: Psychiatry

## 2022-08-28 DIAGNOSIS — F32A Depression, unspecified: Secondary | ICD-10-CM

## 2022-08-28 DIAGNOSIS — F411 Generalized anxiety disorder: Secondary | ICD-10-CM | POA: Diagnosis not present

## 2022-08-28 MED ORDER — GABAPENTIN 300 MG PO CAPS
300.0000 mg | ORAL_CAPSULE | Freq: Three times a day (TID) | ORAL | 3 refills | Status: DC
  Filled 2022-08-28: qty 90, 30d supply, fill #0

## 2022-08-28 MED ORDER — HYDROXYZINE HCL 25 MG PO TABS
25.0000 mg | ORAL_TABLET | Freq: Three times a day (TID) | ORAL | 3 refills | Status: DC | PRN
  Filled 2022-08-28: qty 90, 30d supply, fill #0

## 2022-08-28 MED ORDER — SERTRALINE HCL 50 MG PO TABS
150.0000 mg | ORAL_TABLET | Freq: Every day | ORAL | 3 refills | Status: DC
  Filled 2022-08-28: qty 90, 30d supply, fill #0

## 2022-08-28 MED ORDER — ARIPIPRAZOLE 20 MG PO TABS
20.0000 mg | ORAL_TABLET | Freq: Every day | ORAL | 3 refills | Status: DC
  Filled 2022-08-28: qty 30, 30d supply, fill #0

## 2022-08-28 NOTE — Progress Notes (Signed)
Virtual Visit via Video Note  I connected with Connie Cook on 08/28/22 at  9:30 AM EDT by a video enabled telemedicine application and verified that I am speaking with the correct person using two identifiers.  Location: Patient: Home Provider: Clinic   I discussed the limitations of evaluation and management by telemedicine and the availability of in person appointments. The patient expressed understanding and agreed to proceed.  I provided 30 minutes of non-face-to-face time during this encounter.      08/28/2022 12:44 PM Connie Cook  MRN:  [id number]  Chief Complaint: "I am okay"  HPI: 61 year old female seen today for follow up psychiatric evaluation. She has a psychiatric history of SI, depression, and anxiety.She is currently managed on Zoloft 150 mg daily, hydroxyzine 10 mg 3 times daily, gabapentin 300 mg 3 times daily and Abilify  20 mg daily. She notes that her  medications are effective in managing her psychiatric conditions.  Today she was well-groomed, pleasant, cooperative, and engaged in conversation.  She informed Clinical research associatewriter that she has been doing well.  She reports that her mood is stable and reports that she has minimal anxiety and depression.  Today provider conducted a PHQ-9 and patient scored a 16, at her last visit she scored a 9.  Patient requested not to do a GAD-7 today.  She endorses adequate sleep and appetite. Today she denies SI/HI/AVH, mania, or paranoia.    Patient informed writer that she continues have shoulder, back, and knee pain.  She inform her that gabapentin is somewhat effective in managing her pain.  No medication changes made today.  Patient agreeable to continue medications as prescribed.  No other concerns noted at time.        Visit Diagnosis:    ICD-10-CM   1. Mild depression  F32.A ARIPiprazole (ABILIFY) 20 MG tablet    sertraline (ZOLOFT) 50 MG tablet    2. Generalized anxiety disorder  F41.1 gabapentin  (NEURONTIN) 300 MG capsule    hydrOXYzine (ATARAX) 25 MG tablet      Past Psychiatric History:  SI, depression, and anxiety  Past Medical History:  Past Medical History:  Diagnosis Date   Anxiety    Arthritis    DDD (degenerative disc disease), cervical    Depression    H/O adenoidectomy    History of removal of ovarian cyst    Hyperlipidemia     Past Surgical History:  Procedure Laterality Date   APPENDECTOMY      Family Psychiatric History: Father Alcohol use, paternal aunt/uncle alcohol use   Family History:  Family History  Problem Relation Age of Onset   Depression Mother    Depression Father    Alcohol abuse Father    Alcohol abuse Maternal Uncle    Depression Paternal Uncle    Alcohol abuse Paternal Uncle    Depression Cousin     Social History:  Social History   Socioeconomic History   Marital status: Single    Spouse name: Not on file   Number of children: 0   Years of education: Not on file   Highest education level: Some college, no degree  Occupational History   Not on file  Tobacco Use   Smoking status: Former   Smokeless tobacco: Former  Building services engineerVaping Use   Vaping Use: Never used  Substance and Sexual Activity   Alcohol use: Never   Drug use: Never   Sexual activity: Not on file  Other Topics Concern  Not on file  Social History Narrative   Not on file   Social Determinants of Health   Financial Resource Strain: Not on file  Food Insecurity: Not on file  Transportation Needs: Not on file  Physical Activity: Not on file  Stress: Not on file  Social Connections: Not on file    Allergies:  Allergies  Allergen Reactions   Codeine Shortness Of Breath   Penicillins Rash   Tramadol Itching    Metabolic Disorder Labs: Lab Results  Component Value Date   HGBA1C 6.3 (H) 06/25/2022   No results found for: "PROLACTIN" Lab Results  Component Value Date   CHOL 271 (H) 06/25/2022   TRIG 141 06/25/2022   HDL 49 06/25/2022   LDLCALC  196 (H) 06/25/2022   No results found for: "TSH"  Therapeutic Level Labs: No results found for: "LITHIUM" No results found for: "VALPROATE" No results found for: "CBMZ"  Current Medications: Current Outpatient Medications  Medication Sig Dispense Refill   ARIPiprazole (ABILIFY) 20 MG tablet Take 1 tablet (20 mg total) by mouth once daily. 30 tablet 3   atorvastatin (LIPITOR) 20 MG tablet Take 1 tablet (20 mg total) by mouth daily. 30 tablet 3   gabapentin (NEURONTIN) 300 MG capsule Take 1 capsule (300 mg total) by mouth 3 (three) times daily. 90 capsule 3   hydrOXYzine (ATARAX) 25 MG tablet Take 1 tablet (25 mg total) by mouth 3 (three) times daily as needed. 90 tablet 3   LORazepam (ATIVAN) 1 MG tablet Take 1 mg by mouth daily.     meloxicam (MOBIC) 7.5 MG tablet Take 1 tablet (7.5 mg total) by mouth daily. 30 tablet 1   methocarbamol (ROBAXIN-750) 750 MG tablet Take 1 tablet (750 mg total) by mouth every 8 (eight) hours as needed for muscle spasms. 90 tablet 1   Probiotic Product (PROBIOTIC DAILY PO) Take by mouth.     sertraline (ZOLOFT) 50 MG tablet Take 3 tablets (150 mg total) by mouth once once daily. 90 tablet 3   No current facility-administered medications for this visit.     Musculoskeletal: Strength & Muscle Tone: within normal limits and telehealth visit Gait & Station: normal, telehealth visit Patient leans: N/A  Psychiatric Specialty Exam: Review of Systems  There were no vitals taken for this visit.There is no height or weight on file to calculate BMI.  General Appearance: Well Groomed  Eye Contact:  Good  Speech:  Clear and Coherent and Slow  Volume:  Normal  Mood:  Euthymic  Affect:  Appropriate and Congruent  Thought Process:  Coherent, Goal Directed and Linear  Orientation:  Full (Time, Place, and Person)  Thought Content: WDL and Logical   Suicidal Thoughts:  No  Homicidal Thoughts:  No  Memory:  Immediate;   Good Recent;   Good Remote;   Good   Judgement:  Good  Insight:  Good  Psychomotor Activity:  Normal  Concentration:  Concentration: Good and Attention Span: Good  Recall:  Good  Fund of Knowledge: Good  Language: Good  Akathisia:  No  Handed:  Right  AIMS (if indicated): Not done  Assets:  Communication Skills Desire for Improvement Financial Resources/Insurance Housing Social Support  ADL's:  Intact  Cognition: WNL  Sleep:  Good   Screenings: GAD-7    Flowsheet Row Video Visit from 07/30/2022 in [facility] Office Visit from 06/25/2022 in [place] Video Visit from 05/28/2022 in [facility] Video Visit from 04/29/2022 in [facility] Counselor from 02/23/2022 in [facility]  Total GAD-7 Score 8 15 11 8 21       PHQ2-9    Flowsheet Row Video Visit from 08/28/2022 in [facility] Video Visit from 07/30/2022 in [facility] Office Visit from 06/25/2022 in [place] Video Visit from 05/28/2022 in [facility] Counselor from 05/05/2022 in [place]  PHQ-2 Total Score 4 2 6 5 2   PHQ-9 Total Score 16 9 21 13 16       Flowsheet Row Video Visit from 08/28/2022 in [facility] Video Visit from 05/28/2022 in [facility] Counselor from 04/03/2022 in [place] Error: Question 6 not populated Error: Q3, 4, or 5 should not be populated when Q2 is No        Assessment and Plan: Patient reports that she  is doing well on her current medication regimen.  No medication changes made today.  Patient agreeable to continue medications as prescribed.    1. Mild depression  Continue- ARIPiprazole (ABILIFY) 20 MG tablet; Take 1 tablet  (20 mg total) by mouth once daily.  Dispense: 30 tablet; Refill: 3 Continue- sertraline (ZOLOFT) 50 MG tablet; Take 3 tablets (150 mg total) by mouth once daily.  Dispense: 90 tablet; Refill: 3  2. Generalized anxiety disorder  Continue- hydrOXYzine (ATARAX) 25 MG tablet; Take 1 tablet (25 mg total) by mouth 3 (three) times daily as needed.  Dispense: 90 tablet; Refill: 3 Continue- gabapentin (NEURONTIN) 300 MG capsule; Take 1 capsule (300 mg total) by mouth 3 (three) times daily.  Dispense: 90 capsule; Refill: 3      Follow-up in 3 months Follow-up with therapy   Salley Slaughter, NP 08/28/2022, 12:44 PM

## 2022-09-04 ENCOUNTER — Other Ambulatory Visit: Payer: Self-pay

## 2022-09-07 ENCOUNTER — Ambulatory Visit (INDEPENDENT_AMBULATORY_CARE_PROVIDER_SITE_OTHER): Payer: No Payment, Other | Admitting: Licensed Clinical Social Worker

## 2022-09-07 DIAGNOSIS — F411 Generalized anxiety disorder: Secondary | ICD-10-CM

## 2022-09-07 NOTE — Progress Notes (Signed)
Virtual Visit via Video Note  I connected with [place] on 09/07/22 at 10:00 AM EDT by a video enabled telemedicine application and verified that I am speaking with the correct person using two identifiers.  Location: Patient: pt's home Provider: clinical office   I discussed the limitations of evaluation and management by telemedicine and the availability of in person appointments. The patient expressed understanding and agreed to proceed.   I discussed the assessment and treatment plan with the patient. The patient was provided an opportunity to ask questions and all were answered. The patient agreed with the plan and demonstrated an understanding of the instructions.   The patient was advised to call back or seek an in-person evaluation if the symptoms worsen or if the condition fails to improve as anticipated.  I provided 45 minutes of non-face-to-face time during this encounter.   Heron Nay, LCSWA    THERAPIST PROGRESS NOTE  Session Time: 45 minutes  Participation Level: Active  Behavioral Response: CasualAlertAnxious  Type of Therapy: Individual Therapy  Treatment Goals addressed: self-esteem  ProgressTowards Goals: Progressing   Interventions: CBT and Supportive  Summary: Connie Cook is a 61 y.o. female who presents for f/u with this cln. She connects to the session on time and maintains appropriate eye contact throughout. She reports she has lost a total of 20 lbs since changing to a low carb diet and states she is feeling better. She states her mood has been up and down. "Sometimes I get really down and scared and then I kind of get back up again. Still waiting to hear about disability." She states she is taking an in-person bible study class that is going well and that she is glad she is doing it. She states her sleep has been interrupted due to coughing because of asthmatic bronchitis, but that she makes up for it during the day. She  reports her energy level could be better. She states she wants to start walking but needs to buy tennis shoes first. She states she still struggles with excessive worrying. She states she gets pessimistic. "I just pray for the right thing to happen, whatever it is." She states she could retire early if the disability does not come through. She states she worries about being homeless or not being able to get out and "not making sure I'm not a hermit. I don't want to just dwindle away. I'm not good at making plans and managing myself." She states having income would enable her to pay back her sister and nephew. She states having income could help her get involved in more things with friends. When asked where she feels she is stuck regarding her mental health, she states "My purpose, my low self-esteem." When asked how it impacts her functioning, "I just feel stuck. I can't seem to get past my limitations and why I'm like this. I feel like a failure." She describes being a failure to mean that she didn't make the right decisions in life "Like I never knew myself really." When asked how she would have wanted her life to be different, she states possibly to have gotten married, maybe to have had a child. She states that most of the time she does not regret not having children, but sometimes she thinks about the what ifs. She states "Most parents tell their children that they want them to get married and have kids. My dad told me not to get married." She states her decision not to  get married and have children does not impact her self-esteem negatively. She states how she was compared to her sister and how she was treated by her parents primarily impacts her self-esteem. She also states she believes she needs to have better friendships, such as being able to actively tend to friendships and isolate less. She reports she would also like to engage in a hobby and that she used to read but no longer does so. She is agreeable  to focusing on self-esteem in future sessions.  Suicidal/Homicidal: Nowithout intent/plan  Therapist Response: Cln assessed for current stressors, symptoms, and safety since last session. Cln utilized active listening and validation to assist with processing. Cln pointed out that pt going to in-person bible study is a big step and praised her progress. Cln asked questions regarding what contributes to her low self-esteem and how obtaining SSD would improve her self-esteem. Cln provided psychoeducation regarding ACEs and expressed optimism regarding progress with increased frequency of sessions. Cln scheduled follow-up appointment and confirmed pt's availability and preferred method of service delivery (virtual).   Plan: Return again in 1 weeks.  Diagnosis: Generalized anxiety disorder  Collaboration of Care: Other none required for this visit  Patient/Guardian was advised Release of Information must be obtained prior to any record release in order to collaborate their care with an outside provider. Patient/Guardian was advised if they have not already done so to contact the registration department to sign all necessary forms in order for [place] to release information regarding their care.   Consent: Patient/Guardian gives verbal consent for treatment and assignment of benefits for services provided during this visit. Patient/Guardian expressed understanding and agreed to proceed.   Wyvonnia Lora, LCSWA 09/07/2022

## 2022-09-08 ENCOUNTER — Other Ambulatory Visit: Payer: Self-pay

## 2022-09-15 ENCOUNTER — Other Ambulatory Visit: Payer: Self-pay

## 2022-09-18 ENCOUNTER — Ambulatory Visit (HOSPITAL_COMMUNITY): Payer: Commercial Managed Care - PPO | Admitting: Licensed Clinical Social Worker

## 2022-09-18 DIAGNOSIS — F411 Generalized anxiety disorder: Secondary | ICD-10-CM

## 2022-09-18 NOTE — Progress Notes (Signed)
Pt signed onto virtual session and reported she is currently experiencing a migraine and stated she would prefer not to have the session today. She apologized for the inconvenience and cln informed her there is no need to apologize and confirmed next scheduled appt on 11/17.

## 2022-09-30 ENCOUNTER — Ambulatory Visit: Payer: Self-pay | Attending: Family Medicine | Admitting: Family Medicine

## 2022-09-30 ENCOUNTER — Encounter: Payer: Self-pay | Admitting: Family Medicine

## 2022-09-30 ENCOUNTER — Other Ambulatory Visit: Payer: Self-pay | Admitting: Family Medicine

## 2022-09-30 ENCOUNTER — Other Ambulatory Visit: Payer: Self-pay

## 2022-09-30 VITALS — BP 121/78 | HR 78 | Temp 97.8°F | Ht 60.0 in | Wt 200.6 lb

## 2022-09-30 DIAGNOSIS — Z23 Encounter for immunization: Secondary | ICD-10-CM

## 2022-09-30 DIAGNOSIS — L821 Other seborrheic keratosis: Secondary | ICD-10-CM

## 2022-09-30 DIAGNOSIS — F332 Major depressive disorder, recurrent severe without psychotic features: Secondary | ICD-10-CM

## 2022-09-30 DIAGNOSIS — E7800 Pure hypercholesterolemia, unspecified: Secondary | ICD-10-CM

## 2022-09-30 MED ORDER — ALBUTEROL SULFATE HFA 108 (90 BASE) MCG/ACT IN AERS
2.0000 | INHALATION_SPRAY | RESPIRATORY_TRACT | 3 refills | Status: DC | PRN
  Filled 2022-09-30: qty 6.7, fill #0
  Filled 2023-02-02: qty 6.7, 25d supply, fill #0

## 2022-09-30 MED ORDER — ALBUTEROL SULFATE HFA 108 (90 BASE) MCG/ACT IN AERS: 2.0000 | INHALATION_SPRAY | RESPIRATORY_TRACT | 3 refills | Status: DC | PRN

## 2022-09-30 MED ORDER — ALBUTEROL SULFATE HFA 108 (90 BASE) MCG/ACT IN AERS
2.0000 | INHALATION_SPRAY | RESPIRATORY_TRACT | 3 refills | Status: DC | PRN
  Filled 2022-09-30: qty 6.7, 16d supply, fill #0

## 2022-09-30 NOTE — Patient Instructions (Signed)
Seborrheic Keratosis A seborrheic keratosis is a common, noncancerous (benign) skin growth. These growths are velvety, waxy, or rough spots that appear on the skin. They are often tan, brown, or black. The skin growths can be flat or raised and may be scaly. What are the causes? The cause of this condition is not known. What increases the risk? You are more likely to develop this condition if you: Have a family history of seborrheic keratosis. Are 50 years old or older. Are pregnant. Have had estrogen replacement therapy. What are the signs or symptoms? Symptoms of this condition include growths on the face, chest, shoulders, back, or other areas. These growths: Are usually painless, but may become irritated and itchy. Can be tan, yellow, brown, black, or other colors. Are slightly raised or have a flat surface. Are sometimes rough or wart-like in texture. Are often velvety or waxy on the surface. Are round or oval-shaped. Often occur in groups, but may occur as a single growth. How is this diagnosed? This condition is diagnosed with a medical history and physical exam. A sample of the growth may be tested (skin biopsy). You may also need to see a skin specialist (dermatologist). How is this treated? Treatment is not usually needed for this condition unless the growths are irritated or bleed often. You may also choose to have the growths removed if you do not like their appearance. Growth removal may include a procedure in which: Liquid nitrogen is applied to "freeze" off the growth (cryosurgery). This is the most common procedure. The growth is burned off with electricity (electrocautery). The growth is removed by scraping (curettage). Follow these instructions at home: Watch your growth or growths for any changes. Do not scratch or pick at the growth or growths. This can cause them to become irritated or infected. Contact a health care provider if: You suddenly have many new  growths. Your growth bleeds, itches, or hurts. Your growth suddenly becomes larger or changes color. Summary A seborrheic keratosis is a common, noncancerous skin growth. Treatment is not usually needed for this condition unless the growths are irritated or bleed often. Watch your growth or growths for any changes. Contact a health care provider if you suddenly have many new growths or your growth suddenly becomes larger or changes color. This information is not intended to replace advice given to you by your health care provider. Make sure you discuss any questions you have with your health care provider. Document Revised: 01/16/2022 Document Reviewed: 01/16/2022 Elsevier Patient Education  2023 [employer]  

## 2022-09-30 NOTE — Progress Notes (Signed)
Subjective:  Patient ID: Connie Cook, female    DOB: Jul 27, 1961  Age: 61 y.o. MRN: [id number]  CC: Back Pain   HPI Zonya Korin Setzler is a 61 y.o. 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  Her back is doing okay and is not hurting. I had referred her for Physical Therapy but she never followed through with this.  She has got some spots on her back she would checked out as they are itchy. After her last visit she was placed on atorvastatin for hyperlipidemia.  A1c had also returned at three 6.3 with a diagnosis of prediabetes. She has lost 24 lbs in the last 3 months by means of a low carb diet and walking. Depression is managed by his behavioral health. Past Medical History:  Diagnosis Date   Anxiety    Arthritis    DDD (degenerative disc disease), cervical    Depression    H/O adenoidectomy    History of removal of ovarian cyst    Hyperlipidemia     Past Surgical History:  Procedure Laterality Date   APPENDECTOMY      Family History  Problem Relation Age of Onset   Depression Mother    Depression Father    Alcohol abuse Father    Alcohol abuse Maternal Uncle    Depression Paternal Uncle    Alcohol abuse Paternal Uncle    Depression Cousin     Social History   Socioeconomic History   Marital status: Single    Spouse name: Not on file   Number of children: 0   Years of education: Not on file   Highest education level: Some college, no degree  Occupational History   Not on file  Tobacco Use   Smoking status: Former   Smokeless tobacco: Former  Scientific laboratory technician Use: Never used  Substance and Sexual Activity   Alcohol use: Never   Drug use: Never    Sexual activity: Not on file  Other Topics Concern   Not on file  Social History Narrative   Not on file   Social Determinants of Health   Financial Resource Strain: Not on file  Food Insecurity: Not on file  Transportation Needs: Not on file  Physical Activity: Not on file  Stress: Not on file  Social Connections: Not on file    Allergies  Allergen Reactions   Codeine Shortness Of Breath   Penicillins Rash   Tramadol Itching    Outpatient Medications Prior to Visit  Medication Sig Dispense Refill   albuterol (VENTOLIN HFA) 108 (90 Base) MCG/ACT inhaler Inhale into the lungs.     ARIPiprazole (ABILIFY) 20 MG tablet Take 1 tablet (20 mg total) by mouth once daily. 30 tablet 3   atorvastatin (LIPITOR) 20 MG tablet Take 1 tablet (20 mg total) by mouth daily. 30 tablet 3   gabapentin (NEURONTIN) 300 MG capsule Take 1 capsule (300 mg total) by mouth 3 (three) times daily. 90 capsule 3   hydrOXYzine (ATARAX) 25 MG tablet Take 1 tablet (25 mg total) by mouth 3 (three) times daily as needed. 90 tablet 3   LORazepam (ATIVAN) 1 MG tablet Take 1 mg by mouth daily.     meloxicam (  MOBIC) 7.5 MG tablet Take 1 tablet (7.5 mg total) by mouth daily. 30 tablet 1   methocarbamol (ROBAXIN-750) 750 MG tablet Take 1 tablet (750 mg total) by mouth every 8 (eight) hours as needed for muscle spasms. 90 tablet 1   Probiotic Product (PROBIOTIC DAILY PO) Take by mouth.     sertraline (ZOLOFT) 50 MG tablet Take 3 tablets (150 mg total) by mouth once once daily. 90 tablet 3   No facility-administered medications prior to visit.     ROS Review of Systems  Constitutional:  Negative for activity change, appetite change and fatigue.  HENT:  Negative for congestion, sinus pressure and sore throat.   Eyes:  Negative for visual disturbance.  Respiratory:  Negative for cough, chest tightness, shortness of breath and wheezing.   Cardiovascular:  Negative for chest pain and palpitations.  Gastrointestinal:   Negative for abdominal distention, abdominal pain and constipation.  Endocrine: Negative for polydipsia.  Genitourinary:  Negative for dysuria and frequency.  Musculoskeletal:  Negative for arthralgias and back pain.  Skin:  Positive for rash.  Neurological:  Negative for tremors, light-headedness and numbness.  Hematological:  Does not bruise/bleed easily.  Psychiatric/Behavioral:  Negative for agitation and behavioral problems.     Objective:  BP 121/78   Pulse 78   Temp 97.8 F (36.6 C) (Oral)   Ht 5' (1.524 m)   Wt 200 lb 9.6 oz (91 kg)   SpO2 96%   BMI 39.18 kg/m      09/30/2022   10:37 AM 06/25/2022    8:58 AM 11/13/2014    8:27 AM  BP/Weight  Systolic BP 131 438 887  Diastolic BP 78 79 74  Wt. (Lbs) 200.6 224   BMI 39.18 kg/m2 43.75 kg/m2       Physical Exam Constitutional:      Appearance: She is well-developed.  Cardiovascular:     Rate and Rhythm: Normal rate.     Heart sounds: Normal heart sounds. No murmur heard. Pulmonary:     Effort: Pulmonary effort is normal.     Breath sounds: Normal breath sounds. No wheezing or rales.  Chest:     Chest wall: No tenderness.  Abdominal:     General: Bowel sounds are normal. There is no distension.     Palpations: Abdomen is soft. There is no mass.     Tenderness: There is no abdominal tenderness.  Musculoskeletal:        General: Normal range of motion.     Right lower leg: No edema.     Left lower leg: No edema.  Skin:    Comments: Tiny raised brownish plaques scattered on mid back with rough edges and some scaling.  Associated scratch marks and scabs noted  Neurological:     Mental Status: She is alert and oriented to person, place, and time.  Psychiatric:        Mood and Affect: Mood normal.        Latest Ref Rng & Units 06/25/2022    9:45 AM  CMP  Glucose 70 - 99 mg/dL 94   BUN 8 - 27 mg/dL 11   Creatinine 0.57 - 1.00 mg/dL 0.77   Sodium 134 - 144 mmol/L 144   Potassium 3.5 - 5.2 mmol/L 4.0    Chloride 96 - 106 mmol/L 103   CO2 20 - 29 mmol/L 23   Calcium 8.7 - 10.3 mg/dL 9.2   Total Protein 6.0 - 8.5 g/dL 7.6   Total  Bilirubin 0.0 - 1.2 mg/dL 0.4   Alkaline Phos 44 - 121 IU/L 91   AST 0 - 40 IU/L 27   ALT 0 - 32 IU/L 23     Lipid Panel     Component Value Date/Time   CHOL 271 (H) 06/25/2022 0945   TRIG 141 06/25/2022 0945   HDL 49 06/25/2022 0945   LDLCALC 196 (H) 06/25/2022 0945    CBC    Component Value Date/Time   WBC 7.7 06/25/2022 0945   RBC 5.06 06/25/2022 0945   HGB 13.7 06/25/2022 0945   HCT 41.7 06/25/2022 0945   PLT 341 06/25/2022 0945   MCV 82 06/25/2022 0945   MCH 27.1 06/25/2022 0945   MCHC 32.9 06/25/2022 0945   RDW 12.9 06/25/2022 0945   LYMPHSABS 1.8 06/25/2022 0945   EOSABS 0.2 06/25/2022 0945   BASOSABS 0.1 06/25/2022 0945    Lab Results  Component Value Date   HGBA1C 6.3 (H) 06/25/2022    Assessment & Plan:  1. Pure hypercholesterolemia Uncontrolled She is currently on atorvastatin Repeat lipid panel Low-cholesterol diet Commended on weight loss - LP+Non-HDL Cholesterol - CMP14+EGFR  2. Seborrheic keratoses Patient reassured regarding this She will continue to monitor this and notify us if she notices any changes in the lesion I have taken a picture for her on her form so she can follow-up  3. Severe episode of recurrent major depressive disorder, without psychotic features ([place]) Stable Continue Abilify, hydroxyzine, lorazepam Managed by behavioral health  4. Need for immunization against influenza - Flu Vaccine QUAD 37moIM (Fluarix, Fluzone & Alfiuria Quad PF)   Health Care Maintenance: Will address at next visit No orders of the defined types were placed in this encounter.   Follow-up: Return in about 6 weeks (around 11/11/2022) for [place], FAAFP. [facility] [place]  09/30/2022, 12:14 PM

## 2022-10-01 ENCOUNTER — Other Ambulatory Visit: Payer: Self-pay

## 2022-10-01 ENCOUNTER — Other Ambulatory Visit: Payer: Self-pay | Admitting: Family Medicine

## 2022-10-01 LAB — CMP14+EGFR
ALT: 14 IU/L (ref 0–32)
AST: 22 IU/L (ref 0–40)
Albumin/Globulin Ratio: 1.5 (ref 1.2–2.2)
Albumin: 4.3 g/dL (ref 3.9–4.9)
Alkaline Phosphatase: 91 IU/L (ref 44–121)
BUN/Creatinine Ratio: 14 (ref 12–28)
BUN: 10 mg/dL (ref 8–27)
Bilirubin Total: 0.4 mg/dL (ref 0.0–1.2)
CO2: 26 mmol/L (ref 20–29)
Calcium: 9.7 mg/dL (ref 8.7–10.3)
Chloride: 101 mmol/L (ref 96–106)
Creatinine, Ser: 0.69 mg/dL (ref 0.57–1.00)
Globulin, Total: 2.9 g/dL (ref 1.5–4.5)
Glucose: 97 mg/dL (ref 70–99)
Potassium: 4.3 mmol/L (ref 3.5–5.2)
Sodium: 140 mmol/L (ref 134–144)
Total Protein: 7.2 g/dL (ref 6.0–8.5)
eGFR: 99 mL/min/{1.73_m2} (ref >59)

## 2022-10-01 LAB — LP+NON-HDL CHOLESTEROL
Cholesterol, Total: 252 mg/dL — ABNORMAL HIGH (ref 100–199)
HDL: 44 mg/dL (ref >39)
LDL Chol Calc (NIH): 176 mg/dL — ABNORMAL HIGH (ref 0–99)
Total Non-HDL-Chol (LDL+VLDL): 208 mg/dL — ABNORMAL HIGH (ref 0–129)
Triglycerides: 173 mg/dL — ABNORMAL HIGH (ref 0–149)
VLDL Cholesterol Cal: 32 mg/dL (ref 5–40)

## 2022-10-01 MED ORDER — ATORVASTATIN CALCIUM 40 MG PO TABS
40.0000 mg | ORAL_TABLET | Freq: Every day | ORAL | 1 refills | Status: DC
  Filled 2022-10-01: qty 30, 30d supply, fill #0
  Filled 2023-01-29: qty 90, 90d supply, fill #0

## 2022-10-02 ENCOUNTER — Telehealth (HOSPITAL_COMMUNITY): Payer: Self-pay | Admitting: Licensed Clinical Social Worker

## 2022-10-02 ENCOUNTER — Ambulatory Visit (HOSPITAL_COMMUNITY): Payer: No Payment, Other | Admitting: Licensed Clinical Social Worker

## 2022-10-02 ENCOUNTER — Encounter (HOSPITAL_COMMUNITY): Payer: Self-pay

## 2022-10-05 ENCOUNTER — Ambulatory Visit (INDEPENDENT_AMBULATORY_CARE_PROVIDER_SITE_OTHER): Payer: No Payment, Other | Admitting: Licensed Clinical Social Worker

## 2022-10-05 DIAGNOSIS — F411 Generalized anxiety disorder: Secondary | ICD-10-CM | POA: Diagnosis not present

## 2022-10-05 NOTE — Progress Notes (Signed)
Virtual Visit via Video Note  I connected with [place] [name] on 10/05/22 at 10:00 AM EST by a video enabled telemedicine application and verified that I am speaking with the correct person using two identifiers.  Location: Patient: pt's home Provider: clinical office   I discussed the limitations of evaluation and management by telemedicine and the availability of in person appointments. The patient expressed understanding and agreed to proceed.   I discussed the assessment and treatment plan with the patient. The patient was provided an opportunity to ask questions and all were answered. The patient agreed with the plan and demonstrated an understanding of the instructions.   The patient was advised to call back or seek an in-person evaluation if the symptoms worsen or if the condition fails to improve as anticipated.  I provided 48 minutes of non-face-to-face time during this encounter.   Connie Cook, LCSWA    THERAPIST PROGRESS NOTE  Session Time: 48 minutes  Participation Level: Active  Behavioral Response: CasualAlertAnxious and Euthymic  Type of Therapy: Individual Therapy  Treatment Goals addressed: boundary-setting  ProgressTowards Goals: Progressing  Interventions: CBT  Summary: Connie Cook is a 61 y.o. female who presents for f/u with this cln. She arrives on time and maintains appropriate eye contact throughout the session. She reports she is doing okay. She states she will be having Thanksgiving with her sister's husband's family, and she is nervous because she usually doesn't go. She states she knows her brother-in-law's family and "it's fun to see them and catch up every once in a while." She also states she is still doing Bible study and that is going well. She states the others bothered her in the beginning, but now she's okay with them. She states she has lost a total of 36 pounds and is not bothered by Thanksgiving. "I'll just make good  choices. That's all I can do. I don't feel like it's going to be a problem." She states she focuses on gratitude when struggling with negative self-talk. She states it's hard for her not to be a people-pleaser and she does it over and over again and doesn't know how to stop. She states this often occurs with her sister. "There's always an underlying reason why she wants me to do what she wants." She reports most recently this involved picking out sneakers and pt states she was able to tell herself not to people-please and ultimately got what she wanted. She states she is proud of herself for doing so. She discusses other ways in which she people-pleases and is afraid to stand up for herself. She cites her sister repeatedly asking about the disability process as a situation in which she would like to stand up for herself but has not been able to. She is receptive to feedback from cln.  Suicidal/Homicidal: Nowithout intent/plan  Therapist Response: Cln assessed for current stressors, symptoms, and safety since last session. Cln utilized active listening and validation to assist with processing. Cln discussed how assertiveness can be scary due to the fear of others' reactions, particularly that their reaction may impact our self-esteem. Cln validated how that may make [place] feel in the moment and expressed that we are not responsible for others' reactions when we communicate with kindness and respect. Cln praised pt's progress and highlighted pt's strengths. Cln encouraged pt to set boundaries with her sister, but emphasized she should only do so if she feels ready. Cln discussed how the therapeutic relationship is one in which the  therapist supports the client, not one in which the client should aim to please the therapist and cln reiterated the progress pt has made and cln expressed pride. Cln scheduled follow-up appointment and confirmed pt's availability and preferred method of service delivery (virtual).  Plan:  Return again in 2 weeks.  Diagnosis: Generalized anxiety disorder  Collaboration of Care: Other none required for this visit  Patient/Guardian was advised Release of Information must be obtained prior to any record release in order to collaborate their care with an outside provider. Patient/Guardian was advised if they have not already done so to contact the registration department to sign all necessary forms in order for [place] to release information regarding their care.   Consent: Patient/Guardian gives verbal consent for treatment and assignment of benefits for services provided during this visit. Patient/Guardian expressed understanding and agreed to proceed.   Connie Cook, LCSWA 10/05/2022

## 2022-10-07 ENCOUNTER — Other Ambulatory Visit: Payer: Self-pay

## 2022-10-12 ENCOUNTER — Encounter: Payer: Self-pay | Admitting: Family Medicine

## 2022-10-23 ENCOUNTER — Ambulatory Visit (HOSPITAL_COMMUNITY): Payer: No Payment, Other | Admitting: Licensed Clinical Social Worker

## 2022-10-23 ENCOUNTER — Telehealth (HOSPITAL_COMMUNITY): Payer: Self-pay | Admitting: Licensed Clinical Social Worker

## 2022-10-23 ENCOUNTER — Encounter (HOSPITAL_COMMUNITY): Payer: Self-pay

## 2022-10-26 ENCOUNTER — Ambulatory Visit (INDEPENDENT_AMBULATORY_CARE_PROVIDER_SITE_OTHER): Payer: No Payment, Other | Admitting: Licensed Clinical Social Worker

## 2022-10-26 DIAGNOSIS — F411 Generalized anxiety disorder: Secondary | ICD-10-CM
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
## 2022-11-11 ENCOUNTER — Encounter: Payer: Self-pay | Admitting: Family Medicine

## 2022-11-23 ENCOUNTER — Ambulatory Visit (INDEPENDENT_AMBULATORY_CARE_PROVIDER_SITE_OTHER): Payer: No Payment, Other | Admitting: Licensed Clinical Social Worker

## 2022-11-23 ENCOUNTER — Encounter (HOSPITAL_COMMUNITY): Payer: Self-pay

## 2022-11-23 DIAGNOSIS — F331 Major depressive disorder, recurrent, moderate: Secondary | ICD-10-CM | POA: Insufficient documentation

## 2022-11-23 DIAGNOSIS — F411 Generalized anxiety disorder: Secondary | ICD-10-CM | POA: Diagnosis not present

## 2022-11-23 NOTE — Progress Notes (Signed)
Virtual Visit via Video Note  I connected with [place] on 11/23/22 at  9:00 AM EST by a video enabled telemedicine application and verified that I am speaking with the correct person using two identifiers.  Location: Patient: pt's home Provider: clinical office    I discussed the limitations of evaluation and management by telemedicine and the availability of in person appointments. The patient expressed understanding and agreed to proceed.   I discussed the assessment and treatment plan with the patient. The patient was provided an opportunity to ask questions and all were answered. The patient agreed with the plan and demonstrated an understanding of the instructions.   The patient was advised to call back or seek an in-person evaluation if the symptoms worsen or if the condition fails to improve as anticipated.  I provided 50 minutes of non-face-to-face time during this encounter.   Heron Nay, LCSWA    THERAPIST PROGRESS NOTE  Session Time: 50 minutes  Participation Level: Active  Behavioral Response: CasualAlertAnxious and Depressed  Type of Therapy: Individual Therapy  Treatment Goals addressed: update tx plan  ProgressTowards Goals: Revised and updated  Interventions: CBT and Other: feminist therapy  Summary: Sabre Annaclaire Walsworth is a 62 y.o. female who presents for f/u with this cln. She arrives on time and maintains appropriate eye contact throughout the session while video is enabled. Approximately halfway through the session, video is disabled due to connectivity issues. When asked about tx goals, she states, "My self-esteem, procrastination, and anxiety." She describes improved anxiety would present as "Not worrying so much and not being afraid of situations. Especially the social anxiety." She states progress has been "up and down. It's like I forget to do things like write in a journal, some tools I've been getting. My focus isn't really  good." She states she procrastinates "everything," such as taking showers, doing small chores, going out, making decisions. States she last showered five days ago and last brushed her teeth seven days ago. "It's like I'm always afraid to fail. I just don't have the energy. I guess I'm always depressed. If it's not really suicidal, I guess I'm used to it." She reports she is sleeping excessively, approximately 12 hours each night. She reports she has been feeling more depressed over the last couple of months. She states she typically gets depressed during the holidays. She states she is scheduled for a physical on the 17th. She states she is continuing the bible study and thought it would be longer and more involved. "I enjoy the subject, but it's not what I thought it was gonna be."  Regarding her decreased ADLs, she states it is mostly due to her habit of procrastinating vs depression. She states her skin is itchy, her bathroom is small, and that showering is not relaxing. "When I procrastinate, I always think 'you know you feel better when you get done.' I'm never good at loving myself or taking care of myself. I'm just not good at myself. I'd rather just snuggle up and keep warm and forget about it." She states she doesn't like looking at herself in the mirror and states this is likely the main reason she procrastinates showering. "Even when I was small, I was still obsessed with what I ate." She discusses ways in which her self-esteem was impacted by customer attitudes while working at [employer]. She agrees to focus on working on [employer] issues in therapy. She is receptive to feedback and agrees to homework  assignment. Pt provides verbal consent for cln to complete and sign proposed tx plan electronically on pt's behalf.  Suicidal/Homicidal: Nowithout intent/plan  Therapist Response: Cln assessed for current stressors, symptoms, and safety since last session. Cln informed pt of the need to update  tx plan and asked pt to identify goals. Cln completed new tx plan and signed electronically on pt's behalf with consent. Cln administered PHQ and GAD-7 to assess severity of depression and anxiety symptoms. Cln utilized active listening and validation to assist with processing. Cln advised pt of the need for a physical, as physical issues such as thyroid disease and vitamin deficiencies can contribute to depressive symptoms. Cln assessed current and baseline ADLs. Cln explored pt's procrastination of showering and body image issues. Cln discussed societal expectations of women's physical experience and highlighted how such expectations are not realistic or functional. Cln tasked pt with journaling 3 nights per week 5 ways in which she felt physical pleasure or relief that day and then thanking her body. Cln explained the purpose of this is to focus on how our bodies serve [place] outside of physical appearance to improve body image. Cln discussed Medicaid expansion and informed pt cln will email flyer. Cln scheduled follow-up appointment and confirmed pt's availability and preferred method of service delivery (virtual).  Plan: Return again in 2 weeks.  Diagnosis: Generalized anxiety disorder  MDD (major depressive disorder), recurrent episode, moderate ([facility])  Collaboration of Care: Other none required for this visit  Patient/Guardian was advised Release of Information must be obtained prior to any record release in order to collaborate their care with an outside provider. Patient/Guardian was advised if they have not already done so to contact the registration department to sign all necessary forms in order for [place] to release information regarding their care.   Consent: Patient/Guardian gives verbal consent for treatment and assignment of benefits for services provided during this visit. Patient/Guardian expressed understanding and agreed to proceed.   Wyvonnia Lora, LCSWA 11/23/2022

## 2022-11-27 ENCOUNTER — Telehealth (INDEPENDENT_AMBULATORY_CARE_PROVIDER_SITE_OTHER): Payer: No Payment, Other | Admitting: Student in an Organized Health Care Education/Training Program

## 2022-11-27 ENCOUNTER — Encounter (HOSPITAL_COMMUNITY): Payer: Self-pay | Admitting: Student in an Organized Health Care Education/Training Program

## 2022-11-27 ENCOUNTER — Other Ambulatory Visit: Payer: Self-pay

## 2022-11-27 DIAGNOSIS — F32A Depression, unspecified: Secondary | ICD-10-CM

## 2022-11-27 DIAGNOSIS — F411 Generalized anxiety disorder: Secondary | ICD-10-CM

## 2022-11-27 MED ORDER — SERTRALINE HCL 50 MG PO TABS
150.0000 mg | ORAL_TABLET | Freq: Every day | ORAL | 3 refills | Status: DC
  Filled 2022-11-27: qty 90, 30d supply, fill #0

## 2022-11-27 MED ORDER — HYDROXYZINE HCL 25 MG PO TABS
25.0000 mg | ORAL_TABLET | Freq: Three times a day (TID) | ORAL | 3 refills | Status: DC | PRN
  Filled 2022-11-27: qty 90, 30d supply, fill #0

## 2022-11-27 MED ORDER — ARIPIPRAZOLE 20 MG PO TABS
20.0000 mg | ORAL_TABLET | Freq: Every day | ORAL | 3 refills | Status: DC
  Filled 2022-11-27: qty 30, 30d supply, fill #0

## 2022-11-27 MED ORDER — GABAPENTIN 300 MG PO CAPS
300.0000 mg | ORAL_CAPSULE | Freq: Three times a day (TID) | ORAL | 3 refills | Status: DC
  Filled 2022-11-27: qty 90, 30d supply, fill #0

## 2022-11-27 NOTE — Progress Notes (Signed)
BH MD/PA/NP OP Progress Note   Virtual Visit via Video Note  I connected with Connie Cook on 11/27/22 at 11:00 AM EST by a video enabled telemedicine application and verified that I am speaking with the correct person using two identifiers.  Location: Patient: Home Provider: [facility]   I discussed the limitations of evaluation and management by telemedicine and the availability of in person appointments. The patient expressed understanding and agreed to proceed.   11/27/2022 11:23 AM Connie Cook  MRN:  [id number]  Chief Complaint:  Chief Complaint  Patient presents with   Follow-up   Medication Refill   HPI:  Connie Cook is a 62 yr old female who presents via Virtual Video Visit for Follow Up and Medication Management.  PPHx is significant for Depression and Anxiety.  She reports that she is continued to do well since her last appointment.  She reports that her depression and anxiety have been well-controlled with her medications.  She reports no side effects from her medications.  She reports that her A1c was elevated so her PCP has placed her on a low carb diet.  She reports that she has lost about 43 pounds and does plan to continue losing more.  Discussed with her that since she is on Abilify she should have an EKG done.  She reports that she has a physical on 1/17 with her PCP.  Discussed with her the importance of obtaining an EKG at that time and she reports that she will have 1 done.  Discussed with her that since she is continuing to do well we would not make any changes to her medications at this time and she was agreeable with this.  She reports no SI, HI, or AVH.  She reports her sleep is good.  She reports her appetite is doing good.  She reports no other concerns at present.  She will return for follow-up in approximately 3 months.   Visit Diagnosis:    ICD-10-CM   1. Generalized anxiety disorder  F41.1 gabapentin (NEURONTIN) 300 MG capsule     hydrOXYzine (ATARAX) 25 MG tablet    2. Mild depression  F32.A ARIPiprazole (ABILIFY) 20 MG tablet    sertraline (ZOLOFT) 50 MG tablet      Past Psychiatric History: Depression and Anxiety.  Past Medical History:  Past Medical History:  Diagnosis Date   Anxiety    Arthritis    DDD (degenerative disc disease), cervical    Depression    H/O adenoidectomy    History of removal of ovarian cyst    Hyperlipidemia     Past Surgical History:  Procedure Laterality Date   APPENDECTOMY      Family Psychiatric History: Father- Depression and EtOH Abuse Mother- Depression Paternal Uncle- Depression and EtOH Abuse Maternal Uncle- Depression and EtOH Abuse  Family History:  Family History  Problem Relation Age of Onset   Depression Mother    Depression Father    Alcohol abuse Father    Alcohol abuse Maternal Uncle    Depression Paternal Uncle    Alcohol abuse Paternal Uncle    Depression Cousin     Social History:  Social History   Socioeconomic History   Marital status: Single    Spouse name: Not on file   Number of children: 0   Years of education: Not on file   Highest education level: Some college, no degree  Occupational History   Not on file  Tobacco Use   Smoking  status: Former   Smokeless tobacco: Former  Scientific laboratory technician Use: Never used  Substance and Sexual Activity   Alcohol use: Never   Drug use: Never   Sexual activity: Not on file  Other Topics Concern   Not on file  Social History Narrative   Not on file   Social Determinants of Health   Financial Resource Strain: Not on file  Food Insecurity: Not on file  Transportation Needs: Not on file  Physical Activity: Not on file  Stress: Not on file  Social Connections: Not on file    Allergies:  Allergies  Allergen Reactions   Codeine Shortness Of Breath   Penicillins Rash   Tramadol Itching    Metabolic Disorder Labs: Lab Results  Component Value Date   HGBA1C 6.3 (H) 06/25/2022    No results found for: "PROLACTIN" Lab Results  Component Value Date   CHOL 252 (H) 09/30/2022   TRIG 173 (H) 09/30/2022   HDL 44 09/30/2022   LDLCALC 176 (H) 09/30/2022   LDLCALC 196 (H) 06/25/2022   No results found for: "TSH"  Therapeutic Level Labs: No results found for: "LITHIUM" No results found for: "VALPROATE" No results found for: "CBMZ"  Current Medications: Current Outpatient Medications  Medication Sig Dispense Refill   albuterol (VENTOLIN HFA) 108 (90 Base) MCG/ACT inhaler Inhale 2 puffs into the lungs every 4 (four) hours as needed for wheezing or shortness of breath. 6.7 g 3   ARIPiprazole (ABILIFY) 20 MG tablet Take 1 tablet (20 mg total) by mouth once daily. 30 tablet 3   atorvastatin (LIPITOR) 40 MG tablet Take 1 tablet (40 mg total) by mouth daily. 90 tablet 1   gabapentin (NEURONTIN) 300 MG capsule Take 1 capsule (300 mg total) by mouth 3 (three) times daily. 90 capsule 3   hydrOXYzine (ATARAX) 25 MG tablet Take 1 tablet (25 mg total) by mouth 3 (three) times daily as needed. 90 tablet 3   LORazepam (ATIVAN) 1 MG tablet Take 1 mg by mouth daily.     meloxicam (MOBIC) 7.5 MG tablet Take 1 tablet (7.5 mg total) by mouth daily. 30 tablet 1   methocarbamol (ROBAXIN-750) 750 MG tablet Take 1 tablet (750 mg total) by mouth every 8 (eight) hours as needed for muscle spasms. 90 tablet 1   Probiotic Product (PROBIOTIC DAILY PO) Take by mouth.     sertraline (ZOLOFT) 50 MG tablet Take 3 tablets (150 mg total) by mouth once once daily. 90 tablet 3   No current facility-administered medications for this visit.     Musculoskeletal: Strength & Muscle Tone: within normal limits Gait & Station: normal Patient leans: N/A  Psychiatric Specialty Exam: Review of Systems  Respiratory:  Negative for shortness of breath.   Cardiovascular:  Negative for chest pain.  Gastrointestinal:  Negative for abdominal pain, constipation, diarrhea, nausea and vomiting.  Neurological:   Negative for dizziness, weakness and headaches.  Psychiatric/Behavioral:  Negative for dysphoric mood, hallucinations, sleep disturbance and suicidal ideas. The patient is not nervous/anxious.     There were no vitals taken for this visit.There is no height or weight on file to calculate BMI.  General Appearance: Casual and Fairly Groomed  Eye Contact:  Good  Speech:  Clear and Coherent and Normal Rate  Volume:  Normal  Mood:  Euthymic  Affect:  Appropriate and Congruent  Thought Process:  Coherent and Goal Directed  Orientation:  Full (Time, Place, and Person)  Thought Content: WDL and Logical  Suicidal Thoughts:  No  Homicidal Thoughts:  No  Memory:  Immediate;   Good Recent;   Good  Judgement:  Good  Insight:  Good  Psychomotor Activity:  Normal  Concentration:  Concentration: Good and Attention Span: Good  Recall:  Good  Fund of Knowledge: Good  Language: Good  Akathisia:  Negative  Handed:  Right  AIMS (if indicated): not done  Assets:  Communication Skills Desire for Improvement Housing Resilience Social Support  ADL's:  Intact  Cognition: WNL  Sleep:  Good   Screenings: GAD-7    Health and safety inspector from 11/23/2022 in [facility] Office Visit from 09/30/2022 in [place] Video Visit from 07/30/2022 in [facility] Office Visit from 06/25/2022 in [place] Video Visit from 05/28/2022 in [facility]  Total GAD-7 Score 16 17 8 15 11       PHQ2-9    Flowsheet Row Counselor from 11/23/2022 in [facility] Office Visit from 09/30/2022 in [place] Video Visit from 08/28/2022 in [facility] Video Visit from 07/30/2022 in [facility] Office Visit from 06/25/2022 in [place]  PHQ-2  Total Score 3 5 4 2 6   PHQ-9 Total Score 15 15 16 9 21       Flowsheet Row Counselor from 11/23/2022 in [facility] Video Visit from 08/28/2022 in [facility] Video Visit from 05/28/2022 in [place] Error: Q3, 4, or 5 should not be populated when Q2 is No Low Risk Error: Question 6 not populated        Assessment and Plan:  Connie Cook is a 62 yr old female who presents via Virtual Video Visit for Follow Up and Medication Management.  PPHx is significant for Depression and Anxiety.   Connie Cook has continued to do well on her medication regimen.  We will not make any changes to her medications at this time.  She has had recent blood work but has not had an EKG.  She does have a physical next week and so will ask to have 1 done at that time to complete the monitoring for being on an antipsychotic.  She will return for follow-up in approximately 3 months. Needs EKG  Depression  GAD: -Continue Zoloft 150 mg daily for depression and anxiety.  90 (50 mg) tablets with 3 refills. -Continue Abilify 20 mg daily for augmentation.  30 tablets with 3 refills. -Continue Hydroxyzine 25 mg TID PRN for anxiety.  90 tablets with 3 refills. -Continue Gabapentin 300 mg TID for anxiety.  90 tablets with 3 refills.    Collaboration of Care: Collaboration of Care: Other provider involved in patient's care AEB Therapist [facility]  Patient/Guardian was advised Release of Information must be obtained prior to any record release in order to collaborate their care with an outside provider. Patient/Guardian was advised if they have not already done so to contact the registration department to sign all necessary forms in order for [place] to release information regarding their care.   Consent: Patient/Guardian gives verbal consent for treatment and assignment of benefits for services provided during this visit.  Patient/Guardian expressed understanding and agreed to proceed.    Briant Cedar, MD 11/27/2022, 11:23 AM  Follow Up Instructions:    I discussed the  assessment and treatment plan with the patient. The patient was provided an opportunity to ask questions and all were answered. The patient agreed with the plan and demonstrated an understanding of the instructions.   The patient was advised to call back or seek an in-person evaluation if the symptoms worsen or if the condition fails to improve as anticipated.  I provided 9 minutes of non-face-to-face time during this encounter.   Lauro Franklin, MD

## 2022-12-02 ENCOUNTER — Ambulatory Visit: Payer: Commercial Managed Care - PPO | Admitting: Family Medicine

## 2022-12-07 ENCOUNTER — Ambulatory Visit (INDEPENDENT_AMBULATORY_CARE_PROVIDER_SITE_OTHER): Payer: No Payment, Other | Admitting: Licensed Clinical Social Worker

## 2022-12-07 DIAGNOSIS — F411 Generalized anxiety disorder: Secondary | ICD-10-CM | POA: Diagnosis not present

## 2022-12-07 NOTE — Progress Notes (Signed)
Virtual Visit via Video Note  I connected with [place] on 12/07/22 at  2:00 PM EST by a video enabled telemedicine application and verified that I am speaking with the correct person using two identifiers.  Location: Patient: pt's home Provider: clinical office   I discussed the limitations of evaluation and management by telemedicine and the availability of in person appointments. The patient expressed understanding and agreed to proceed.   I discussed the assessment and treatment plan with the patient. The patient was provided an opportunity to ask questions and all were answered. The patient agreed with the plan and demonstrated an understanding of the instructions.   The patient was advised to call back or seek an in-person evaluation if the symptoms worsen or if the condition fails to improve as anticipated.  I provided 52 minutes of non-face-to-face time during this encounter.   Heron Nay, LCSWA    THERAPIST PROGRESS NOTE  Session Time: 52 minutes  Participation Level: Active  Behavioral Response: CasualAlertAnxious and Depressed  Type of Therapy: Individual Therapy  Treatment Goals addressed: self-esteem  ProgressTowards Goals: Progressing  Interventions: CBT and Other: Feminist Theory  Summary: Connie Cook is a 62 y.o. female who presents for f/u with this cln. She arrives on time and maintains appropriate eye contact throughout the session. She reports she has been doing her homework and that it has been going fine and that it makes her thankful and appreciate herself. She shares the example of feeling pleasure from her hands because her great nephew grabs her hands, and from her legs because she goes for walks. She states she is appreciating her body more. She agrees to continue this practice. She states her anxiety has been okay but that her depression continues. She states she struggles with pushing herself to leave her home. She  states she continues to engage in the weekly bible study. She states she is feeling bored. "I just feel like I could make better use of my time but I just want to sleep." She states she still has not heard about the disability. She mentions her sister asking her about what she's doing and asking if she has called anyone or is going to see people. She reports she feels anxious when her sister asks her these questions and that she feels like a failure. She discusses feelings related to this dynamic and is able to verbalize how, logically, she is not at fault for her current circumstances, yet she still feels like a failure. She discusses how her parents didn't show interest in her and didn't teach her the things she needed. She states she's never known what her passion is and where she belongs. She talks about the last jobs she had and how her anxiety interfered and she became depressed and discouraged. She then talks about how difficult it was for her to find jobs she was qualified for. She is receptive to feedback.  Suicidal/Homicidal: Nowithout intent/plan  Therapist Response: Cln assessed for current stressors, symptoms, and safety since last session. Cln utilized active listening and validation to assist with processing. Cln reframed pt's thoughts surrounding why she is a failure and pointed out strengths, particularly regarding her independence and choosing not to follow the paths women typically followed during pt's adolescence/early adulthood. Cln framed pt's choices as courageous. Cln framed pt's interactions with her sister as her sister's differing presentation of insecurity and her way of attempting to help and encouraged pt to stop explaining herself when her  sister asks excessive questions. Cln discussed how women have traditionally become stuck in relationships and by having children, which pt did not because she chose not to. Cln empathized with pt's feelings of discouragement regarding jobs. 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 Cln praised pt's progress. Cln scheduled follow-up appointment and confirmed pt's availability and preferred method of service delivery (virtual).  Plan: Return again in 2 weeks.  Diagnosis: Generalized anxiety disorder  Collaboration of Care: Other none required for this visit  Patient/Guardian was advised Release of Information must be obtained prior to any record release in order to collaborate their care with an outside provider. Patient/Guardian was advised if they have not already done so to contact the registration department to sign all necessary forms in order for [place] to release information regarding their care.   Consent: Patient/Guardian gives verbal consent for treatment and assignment of benefits for services provided during this visit. Patient/Guardian expressed understanding and agreed to proceed.   Heron Nay, LCSWA 12/07/2022

## 2022-12-09 ENCOUNTER — Other Ambulatory Visit: Payer: Self-pay

## 2022-12-24 ENCOUNTER — Ambulatory Visit (INDEPENDENT_AMBULATORY_CARE_PROVIDER_SITE_OTHER): Payer: No Payment, Other | Admitting: Licensed Clinical Social Worker

## 2022-12-24 DIAGNOSIS — F411 Generalized anxiety disorder: Secondary | ICD-10-CM | POA: Diagnosis not present

## 2022-12-24 NOTE — Progress Notes (Signed)
Virtual Visit via Video Note  I connected with [place] on 12/24/22 at  4:30 PM EST by a video enabled telemedicine application and verified that I am speaking with the correct person using two identifiers.  Location: Patient: pt's home Provider: clinical home office   I discussed the limitations of evaluation and management by telemedicine and the availability of in person appointments. The patient expressed understanding and agreed to proceed.   I discussed the assessment and treatment plan with the patient. The patient was provided an opportunity to ask questions and all were answered. The patient agreed with the plan and demonstrated an understanding of the instructions.   The patient was advised to call back or seek an in-person evaluation if the symptoms worsen or if the condition fails to improve as anticipated.  I provided 41 minutes of non-face-to-face time during this encounter.   Connie Cook, LCSWA    THERAPIST PROGRESS NOTE  Session Time: 41 minutes  Participation Level: Active  Behavioral Response: CasualAlertAnxious  Type of Therapy: Individual Therapy  Treatment Goals addressed: anxiety  ProgressTowards Goals: variable  Interventions: CBT  Summary: Connie Cook is a 62 y.o. female who presents for f/u with this cln. She arrives on time and maintains appropriate eye contact throughout the session. She reports she is doing fine and has not received any updates on disability. She reports her anxiety level has been "so-so" and that yesterday was difficult, but that she got through it. She reports she was worrying, thinking the worst, and states she wishes she wouldn't allow herself to "go down that rabbit hole." She states she combats the thoughts by distracting herself with household chores and being active. She is receptive to feedback regarding thought challenging. She expresses that she forgets "what I'm supposed to be thinking about"  and that she struggles with her memory. She states she has brought this up to doctors "But they haven't really thought much. They do the thing with the clock but not really anything else." She reports she has an upcoming physical scheduled and consents to cln sending a staff message to her PCP, Dr. Margarita Rana, to share concerns regarding potential cognitive decline. She expresses appreciation and states she is trying to remember the things she has done to take care of herself.    Suicidal/Homicidal: Nowithout intent/plan  Therapist Response: Cln assessed for current stressors, symptoms, and safety since last session. Cln utilized active listening and validation to assist with processing. Cln discussed catching, challenging, and replacing unhelpful thoughts and framed worrying as a compulsion. Cln pointed out that pt expressed worries regarding things that are not currently happening and are therefore unhelpful. Cln provided psychoeducation regarding anxiety and the amygdala. Cln asked pt if she has trouble remembering things and is concerned about potential neurological issues, to which pt stated she is and has not gotten a comprehensive neurological exam. Cln expressed that due to pt's difficulty expressing herself in therapy and her difficulty retaining concepts discussed, coupled with her reported memory issues, there is concern for a neurocognitive problem. Cln phrased this in layman's terms and offered to contact pt's PCP via Epic to share concerns and advocate for a neurology referral. Cln praised pt's continued efforts in therapy and reminded her of the positive changes she has made. Cln emailed medicaid expansion flyer again and recommended pt apply now instead of waiting for disability. Cln scheduled follow-up appointment and confirmed pt's availability and preferred method of service delivery (virtual).  Plan: Return again  in 3 weeks.  Diagnosis: Generalized anxiety disorder  Collaboration of Care:  Primary Care Provider AEB Cln sent staff message through epic to share concerns regarding potential cognitive decline  Patient/Guardian was advised Release of Information must be obtained prior to any record release in order to collaborate their care with an outside provider. Patient/Guardian was advised if they have not already done so to contact the registration department to sign all necessary forms in order for [place] to release information regarding their care.   Consent: Patient/Guardian gives verbal consent for treatment and assignment of benefits for services provided during this visit. Patient/Guardian expressed understanding and agreed to proceed.   Connie Cook, LCSWA 12/24/2022

## 2023-01-11 ENCOUNTER — Ambulatory Visit (INDEPENDENT_AMBULATORY_CARE_PROVIDER_SITE_OTHER): Payer: No Payment, Other | Admitting: Licensed Clinical Social Worker

## 2023-01-11 DIAGNOSIS — F411 Generalized anxiety disorder: Secondary | ICD-10-CM

## 2023-01-11 NOTE — Progress Notes (Signed)
Virtual Visit via Video Note  I connected with Connie Cook on 01/11/23 at  2:00 PM EST by a video enabled telemedicine application and verified that I am speaking with the correct person using two identifiers.  Location: Patient: pt's home Provider: clinical office   I discussed the limitations of evaluation and management by telemedicine and the availability of in person appointments. The patient expressed understanding and agreed to proceed.   I discussed the assessment and treatment plan with the patient. The patient was provided an opportunity to ask questions and all were answered. The patient agreed with the plan and demonstrated an understanding of the instructions.   The patient was advised to call back or seek an in-person evaluation if the symptoms worsen or if the condition fails to improve as anticipated.  I provided 44 minutes of non-face-to-face time during this encounter.   Heron Nay, LCSWA    THERAPIST PROGRESS NOTE  Session Time: 44 minutes  Participation Level: Active  Behavioral Response: CasualAlertAnxious  Type of Therapy: Individual Therapy  Treatment Goals addressed: anxiety, self-esteem  ProgressTowards Goals: Progressing  Interventions: CBT  Summary: Connie Cook is a 62 y.o. female who presents for f/u with this cln. She arrives on time and maintains appropriate eye contact throughout the session. She reports she was approved for disability and will be receiving her first payment mid March. She reports she was upset with her sister because she requested specific information regarding the help she received from her sister and nephew, and her sister initially was unwilling to provide documentation. She states her sister typed something. "My anxiety has been up and down I guess." She reports she continues to struggle with standing up for herself. She reports her sister said something to her that was obvious, but pt can't remember  what it was, and reports she felt insulted by her sister pointing it out. "I always feel like they think I'm not telling the truth or something." She states she is sure they (her sister and nephew) would be defensive if she brought up how they come across to her. She also states she believes they are too involved in her business. She states she is afraid she will not be able to express herself appropriately. She states she knows they don't do it intentionally. She discusses her parents and her memory issues and confirms she has an appointment with her PCP, who cln messaged after last session. Pt reports she has also noticed changes in her taste and smell, though she has never tested positive for Covid. She is receptive to feedback.  Suicidal/Homicidal: Nowithout intent/plan  Therapist Response: Cln assessed for current stressors, symptoms, and safety since last session. Cln utilized active listening and validation to assist with processing. Cln discussed utilizing "I" statements to navigate conflict with her sister and nephew and suggests she write down how she would like to address them ahead of time. Cln pointed out that by expressing a desire to stand up for herself, pt is showing improvement in her self-esteem and reflected on the progress she has made. Cln recommended pt treat herself within the bounds of her budget once she gets paid. Cln scheduled follow-up appointment and confirmed pt's availability and preferred method of service delivery (virtual).  Plan: Return again in 3 weeks.  Diagnosis: Generalized anxiety disorder  Collaboration of Care: Other none required for this visit  Patient/Guardian was advised Release of Information must be obtained prior to any record release in order to collaborate  their care with an outside provider. Patient/Guardian was advised if they have not already done so to contact the registration department to sign all necessary forms in order for [place] to release  information regarding their care.   Consent: Patient/Guardian gives verbal consent for treatment and assignment of benefits for services provided during this visit. Patient/Guardian expressed understanding and agreed to proceed.   Heron Nay, LCSWA 01/11/2023

## 2023-01-18 ENCOUNTER — Ambulatory Visit: Payer: Self-pay | Attending: Family Medicine | Admitting: Family Medicine

## 2023-01-27 ENCOUNTER — Other Ambulatory Visit: Payer: Self-pay

## 2023-01-29 ENCOUNTER — Other Ambulatory Visit: Payer: Self-pay

## 2023-02-01 ENCOUNTER — Encounter (HOSPITAL_COMMUNITY): Payer: Self-pay

## 2023-02-01 ENCOUNTER — Ambulatory Visit (HOSPITAL_COMMUNITY): Payer: Self-pay | Admitting: Licensed Clinical Social Worker

## 2023-02-01 ENCOUNTER — Telehealth (HOSPITAL_COMMUNITY): Payer: Self-pay | Admitting: Licensed Clinical Social Worker

## 2023-02-01 NOTE — Telephone Encounter (Signed)
Cln signed on at 2:03 pm, sent text link for video session per schedule, and remained online for session until 2:13 pm. Pt failed to sign on for session.

## 2023-02-02 ENCOUNTER — Other Ambulatory Visit: Payer: Self-pay

## 2023-02-15 ENCOUNTER — Ambulatory Visit (INDEPENDENT_AMBULATORY_CARE_PROVIDER_SITE_OTHER): Payer: No Payment, Other | Admitting: Licensed Clinical Social Worker

## 2023-02-15 DIAGNOSIS — F411 Generalized anxiety disorder: Secondary | ICD-10-CM | POA: Diagnosis not present

## 2023-02-15 NOTE — Progress Notes (Signed)
Virtual Visit via Video Note  I connected with Connie Cook on 02/15/23 at 10:00 AM EDT by a video enabled telemedicine application and verified that I am speaking with the correct person using two identifiers.  Location: Patient: pt's home Provider: clinical office   I discussed the limitations of evaluation and management by telemedicine and the availability of in person appointments. The patient expressed understanding and agreed to proceed.  I discussed the assessment and treatment plan with the patient. The patient was provided an opportunity to ask questions and all were answered. The patient agreed with the plan and demonstrated an understanding of the instructions.   The patient was advised to call back or seek an in-person evaluation if the symptoms worsen or if the condition fails to improve as anticipated.  I provided 53 minutes of non-face-to-face time during this encounter.   Heron Nay, LCSW    THERAPIST PROGRESS NOTE  Session Time: 53 minutes  Participation Level: Active  Behavioral Response: CasualAlertAnxious and Depressed  Type of Therapy: Individual Therapy  Treatment Goals addressed: anxiety, assertiveness  ProgressTowards Goals: variable  Interventions: CBT  Summary: Connie Cook is a 62 y.o. female who presents for f/u with this cln. She arrives on time and maintains appropriate eye contact throughout the session. She apologizes for missing the previous session and states she got mixed up. She states she has been sleeping a lot and her mood has been okay. "I've got to quit reading the news because it just scares you to death. My mood's been in the middle, I guess." She states she was not able to have her physical because her lower back was bothering her. She states her anxiety has been better, but still there. GAD-7: 9 and PHQ: 11. She states she has been worrying a lot about the world we are living in. "It seems like every aspect  of it is crazy." She also reports worry with the current rental situation with her nephew. She states that since her nephew doesn't declare the rent payments on taxes, she can't apply for EBT. "I hate being in a spot where I'm not transparent and honest." She states they are doing this because of her brother-in-law, who has refused to sign the deed over. She reports she is still waiting for SSI payments. She states she has tried to discuss her concerns, since EBT would be helpful, and she states it all goes back to her brother-in-law. She states her sister is helping her. She states her sister continues to be domineering, such as that she believes pt does not need [employer] and [employer]. Pt describes how her sister automatically expresses what she thinks is important. When asked if she is ready to try being more assertive, pt reports she tells her sister how she feels and describes how she explains to her sister why she wants what she wants and why she does things the way she does things. She states this does not stop her sister's behavior. "Sometimes I feel like it's not her business and she doesn't need to know everything I'm doing." When asked how she feels about saying that to her sister, she expresses guilt due to her sister being there for her. She states she gets tired of her sister giving her feedback that she didn't ask for her. Pt wrote down suggestions for things to say to be more assertive. Pt is receptive to feedback.   Suicidal/Homicidal: Nowithout intent/plan  Therapist Response: Cln assessed for current stressors,  symptoms, and safety since last session. Cln utilized active listening and validation to assist with processing. Cln utilized therapeutic interrupting as pt continues to struggle with her sister's controlling statements and repeatedly explains why she does not like it. Cln asked pt if she feels ready to practice being more assertive. Cln discussed assertive communication techniques  and stressed the importance of avoiding explanations, as her sister has demonstrated that she has an argument for pt's explanations. Cln suggested pt tell her sister that she is an adult and she is going to do what makes her happy and make her own choices and that she is no longer going to listen to feedback she didn't ask for because it makes her feel bad. Cln suggested utilizing the broken record technique if her sister persists. Cln asked pt to write down statements to use ahead of time and to work toward accepting that her sister is who she is. Cln stated that pt does not have to continue to tolerate the behavior because her sister has helped her and supported her. Cln also emailed pt assertive communication handouts. Cln scheduled follow-up appointment and confirmed pt's availability and preferred method of service delivery (virtual). Assertive statements suggestions, emailed assertive communication handout  Plan: Return again in 2 weeks.  Diagnosis: Generalized anxiety disorder  Collaboration of Care: Other none required for this visit  Patient/Guardian was advised Release of Information must be obtained prior to any record release in order to collaborate their care with an outside provider. Patient/Guardian was advised if they have not already done so to contact the registration department to sign all necessary forms in order for [place] to release information regarding their care.   Consent: Patient/Guardian gives verbal consent for treatment and assignment of benefits for services provided during this visit. Patient/Guardian expressed understanding and agreed to proceed.   Heron Nay, LCSW 02/15/2023

## 2023-03-01 ENCOUNTER — Encounter (HOSPITAL_COMMUNITY): Payer: Self-pay | Admitting: Student in an Organized Health Care Education/Training Program

## 2023-03-01 ENCOUNTER — Other Ambulatory Visit: Payer: Self-pay

## 2023-03-01 ENCOUNTER — Ambulatory Visit (INDEPENDENT_AMBULATORY_CARE_PROVIDER_SITE_OTHER): Payer: No Payment, Other | Admitting: Licensed Clinical Social Worker

## 2023-03-01 ENCOUNTER — Telehealth (INDEPENDENT_AMBULATORY_CARE_PROVIDER_SITE_OTHER): Payer: No Payment, Other | Admitting: Student in an Organized Health Care Education/Training Program

## 2023-03-01 DIAGNOSIS — F32A Depression, unspecified: Secondary | ICD-10-CM | POA: Diagnosis not present

## 2023-03-01 DIAGNOSIS — F411 Generalized anxiety disorder: Secondary | ICD-10-CM

## 2023-03-01 MED ORDER — ARIPIPRAZOLE 20 MG PO TABS
20.0000 mg | ORAL_TABLET | Freq: Every day | ORAL | 3 refills | Status: DC
  Filled 2023-03-01: qty 30, 30d supply, fill #0

## 2023-03-01 MED ORDER — SERTRALINE HCL 50 MG PO TABS
150.0000 mg | ORAL_TABLET | Freq: Every day | ORAL | 3 refills | Status: DC
Start: 2023-03-01 — End: 2023-05-25
  Filled 2023-03-01 – 2023-03-30 (×2): qty 90, 30d supply, fill #0

## 2023-03-01 MED ORDER — HYDROXYZINE HCL 25 MG PO TABS
25.0000 mg | ORAL_TABLET | Freq: Three times a day (TID) | ORAL | 3 refills | Status: DC | PRN
  Filled 2023-03-01: qty 90, 30d supply, fill #0

## 2023-03-01 MED ORDER — GABAPENTIN 300 MG PO CAPS
300.0000 mg | ORAL_CAPSULE | Freq: Three times a day (TID) | ORAL | 3 refills | Status: DC
  Filled 2023-03-01: qty 90, 30d supply, fill #0

## 2023-03-01 NOTE — Progress Notes (Signed)
Virtual Visit via Video Note  I connected with Connie Cook on 03/01/23 at 10:00 AM EDT by a video enabled telemedicine application and verified that I am speaking with the correct person using two identifiers.  Location: Patient: pt's friend's home Provider: clinical office   I discussed the limitations of evaluation and management by telemedicine and the availability of in person appointments. The patient expressed understanding and agreed to proceed.   I discussed the assessment and treatment plan with the patient. The patient was provided an opportunity to ask questions and all were answered. The patient agreed with the plan and demonstrated an understanding of the instructions.   The patient was advised to call back or seek an in-person evaluation if the symptoms worsen or if the condition fails to improve as anticipated.  I provided 47 minutes of non-face-to-face time during this encounter.   Wyvonnia Lora, LCSW    THERAPIST PROGRESS NOTE  Session Time: 47 minutes  Participation Level: Active  Behavioral Response: CasualAlertAnxious  Type of Therapy: Individual Therapy  Treatment Goals addressed: assertiveness, self-worth  ProgressTowards Goals: variable  Interventions: CBT  Summary: Connie Cook is a 62 y.o. female who presents for f/u with this cln. She arrives on time and maintains appropriate eye contact throughout the session. She states she did not check her email for handouts that cln informed her would be sent regarding assertive communication. She states her anxiety has been a little better. She states she is watching a dog for a friend while they are out of town. She states she believes her cholesterol medicine makes her tired, as she has been experiencing increased fatigue over the past two weeks. She reports she has not rescheduled her PCP appointment yet. She states she was waiting for the office to call her back and they never did. She  states her sister came over last Friday and she asked pt if she wants her to help her with her checkbook, and pt responded that she would rather do it herself. She states the conversation went well and it felt good to be assertive. She states she was surprised that her sister didn't ask her if she was sure and cause pt to second-guess herself. She states she believes she could have stayed firm if her sister had pushed back. She states she would like to talk about the fact that she lets friendships go and then doesn't attempt to reconnect with them. 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 Pt states when she called her last year the woman was short with her. Pt states she feels like a failure for not being able to keep an important friendship going. She talks about how she and this woman are from different worlds and she states she believes her friend responded the way she did because she had money and taking care of her mother would have been very different, as her friend could have round the clock care. She talks about her friend that she does bible study with, with whom she is very close, and some other friends she met through work previously. She then goes back to discussing her other friend's privilege and class differences. She is receptive to feedback.  Suicidal/Homicidal: Nowithout intent/plan  Therapist Response: Cln assessed for  current stressors, symptoms, and safety since last session. Cln utilized active listening and validation to assist with processing. Cln reframed past friendship as her friend failing pt and asked pt how she would treat her if the roles were reversed. Cln attempted to break cognitive distortions surrounding pt's expressed mind-reading by pointing out she  doesn't know why her friend responded the way she did, which pt verbalized agreement with but then indicated some continued mind-reading. Cln utilized minimal self-disclosure to relate to the loss of a long-friendship and framed it as a grieving processes. Cln asked pt about current friendships and highlighted the importance of focusing on those. Cln reframed pt's assertion that she needs to "get over it" with "letting go of feeling responsible". Cln scheduled follow-up appointment and confirmed pt's availability and preferred method of service delivery (virtual).  Plan: Return again in 1 weeks.  Diagnosis: Generalized anxiety disorder  Collaboration of Care: Other none required for this visit  Patient/Guardian was advised Release of Information must be obtained prior to any record release in order to collaborate their care with an outside provider. Patient/Guardian was advised if they have not already done so to contact the registration department to sign all necessary forms in order for [place] to release information regarding their care.   Consent: Patient/Guardian gives verbal consent for treatment and assignment of benefits for services provided during this visit. Patient/Guardian expressed understanding and agreed to proceed.   Wyvonnia Lora, LCSW 03/01/2023

## 2023-03-01 NOTE — Progress Notes (Signed)
BH MD/PA/NP OP Progress Note  Virtual Visit via Video Note  I connected with Connie Cook on 03/01/23 at  2:00 PM EDT by a video enabled telemedicine application and verified that I am speaking with the correct person using two identifiers.  Location: Patient: Home Provider: [facility]   I discussed the limitations of evaluation and management by telemedicine and the availability of in person appointments. The patient expressed understanding and agreed to proceed.   03/01/2023 2:21 PM Connie Cook  MRN:  [id number]  Chief Complaint:  Chief Complaint  Patient presents with   Follow-up   Anxiety   Depression   HPI:  Connie Cook is a 62 yr old female who presents via Virtual Video Visit for Follow Up and Medication Management.  PPHx is significant for Depression and Anxiety.   She reports that she has been doing okay since her last appointment.  She reports that she did have a period of time where she was sleeping 10 hours a day, however, she reports that this has been resolving on its own and she is not really sure of what caused this.  She reports that she feels like her depression and anxiety have been well-controlled.  She reports no side effects to her medications.  She discussed in the past her providers have prescribed her as needed Ativan for panic attacks discussed that she does not need a refill at this time but wanted provided to be aware of it.  When asked if she had gotten an EKG she reports she had not because she never made that appointment with her PCP.  Discussed with her the importance of getting the EKG and she reports she will call to make an appointment and will have this done at that time.  She reports no SI, HI, or AVH.  She reports sleep is good.  She reports her appetite is doing good.  She reports no other concerns at present.  She will return for follow-up approximately 3 months.  Discussed with patient that Resident Provider would be  transitioning their care to another Resident Provider starting July 2024.  She reports understanding has no concerns.   Visit Diagnosis:    ICD-10-CM   1. Generalized anxiety disorder  F41.1 gabapentin (NEURONTIN) 300 MG capsule    hydrOXYzine (ATARAX) 25 MG tablet    2. Mild depression  F32.A ARIPiprazole (ABILIFY) 20 MG tablet    sertraline (ZOLOFT) 50 MG tablet      Past Psychiatric History: Depression and Anxiety.  Past Medical History:  Past Medical History:  Diagnosis Date   Anxiety    Arthritis    DDD (degenerative disc disease), cervical    Depression    H/O adenoidectomy    History of removal of ovarian cyst    Hyperlipidemia     Past Surgical History:  Procedure Laterality Date   APPENDECTOMY      Family Psychiatric History: Father- Depression and EtOH Abuse Mother- Depression Paternal Uncle- Depression and EtOH Abuse Maternal Uncle- Depression and EtOH Abuse  Family History:  Family History  Problem Relation Age of Onset   Depression Mother    Depression Father    Alcohol abuse Father    Alcohol abuse Maternal Uncle    Depression Paternal Uncle    Alcohol abuse Paternal Uncle    Depression Cousin     Social History:  Social History   Socioeconomic History   Marital status: Single    Spouse name: Not on file  Number of children: 0   Years of education: Not on file   Highest education level: Some college, no degree  Occupational History   Not on file  Tobacco Use   Smoking status: Former   Smokeless tobacco: Former  Building services engineer Use: Never used  Substance and Sexual Activity   Alcohol use: Never   Drug use: Never   Sexual activity: Not on file  Other Topics Concern   Not on file  Social History Narrative   Not on file   Social Determinants of Health   Financial Resource Strain: Not on file  Food Insecurity: Not on file  Transportation Needs: Not on file  Physical Activity: Not on file  Stress: Not on file  Social  Connections: Not on file    Allergies:  Allergies  Allergen Reactions   Codeine Shortness Of Breath   Penicillins Rash   Tramadol Itching    Metabolic Disorder Labs: Lab Results  Component Value Date   HGBA1C 6.3 (H) 06/25/2022   No results found for: "PROLACTIN" Lab Results  Component Value Date   CHOL 252 (H) 09/30/2022   TRIG 173 (H) 09/30/2022   HDL 44 09/30/2022   LDLCALC 176 (H) 09/30/2022   LDLCALC 196 (H) 06/25/2022   No results found for: "TSH"  Therapeutic Level Labs: No results found for: "LITHIUM" No results found for: "VALPROATE" No results found for: "CBMZ"  Current Medications: Current Outpatient Medications  Medication Sig Dispense Refill   albuterol (VENTOLIN HFA) 108 (90 Base) MCG/ACT inhaler Inhale 2 puffs into the lungs every 4 (four) hours as needed for wheezing or shortness of breath. 6.7 g 3   ARIPiprazole (ABILIFY) 20 MG tablet Take 1 tablet (20 mg total) by mouth once daily. 30 tablet 3   atorvastatin (LIPITOR) 40 MG tablet Take 1 tablet (40 mg total) by mouth daily. 90 tablet 1   gabapentin (NEURONTIN) 300 MG capsule Take 1 capsule (300 mg total) by mouth 3 (three) times daily. 90 capsule 3   hydrOXYzine (ATARAX) 25 MG tablet Take 1 tablet (25 mg total) by mouth 3 (three) times daily as needed. 90 tablet 3   LORazepam (ATIVAN) 1 MG tablet Take 1 mg by mouth daily.     meloxicam (MOBIC) 7.5 MG tablet Take 1 tablet (7.5 mg total) by mouth daily. 30 tablet 1   methocarbamol (ROBAXIN-750) 750 MG tablet Take 1 tablet (750 mg total) by mouth every 8 (eight) hours as needed for muscle spasms. 90 tablet 1   Probiotic Product (PROBIOTIC DAILY PO) Take by mouth.     sertraline (ZOLOFT) 50 MG tablet Take 3 tablets (150 mg total) by mouth once once daily. 90 tablet 3   No current facility-administered medications for this visit.     Musculoskeletal: Strength & Muscle Tone: within normal limits Gait & Station: normal Patient leans: N/A  Psychiatric  Specialty Exam: Review of Systems  Respiratory:  Negative for shortness of breath.   Cardiovascular:  Negative for chest pain.  Gastrointestinal:  Negative for abdominal pain, constipation, diarrhea, nausea and vomiting.  Neurological:  Negative for dizziness, weakness and headaches.  Psychiatric/Behavioral:  Negative for dysphoric mood, hallucinations, sleep disturbance and suicidal ideas. The patient is not nervous/anxious.     There were no vitals taken for this visit.There is no height or weight on file to calculate BMI.  General Appearance: Casual and Fairly Groomed  Eye Contact:  Good  Speech:  Clear and Coherent and Normal Rate  Volume:  Normal  Mood:  Euthymic  Affect:  Appropriate and Congruent  Thought Process:  Coherent and Goal Directed  Orientation:  Full (Time, Place, and Person)  Thought Content: WDL and Logical   Suicidal Thoughts:  No  Homicidal Thoughts:  No  Memory:  Immediate;   Good Recent;   Good  Judgement:  Good  Insight:  Good  Psychomotor Activity:  Normal  Concentration:  Concentration: Good and Attention Span: Good  Recall:  Good  Fund of Knowledge: Good  Language: Good  Akathisia:  Negative  Handed:  Right  AIMS (if indicated): not done  Assets:  Communication Skills Desire for Improvement Housing Resilience Social Support  ADL's:  Intact  Cognition: WNL  Sleep:  Good   Screenings: GAD-7    Advertising copywriter from 02/15/2023 in [facility] Counselor from 11/23/2022 in [facility] Office Visit from 09/30/2022 in [facility] Video Visit from 07/30/2022 in [facility] Office Visit from 06/25/2022 in [facility]  Total GAD-7 Score 9 16 17 8 15       PHQ2-9    Flowsheet Row Counselor from 02/15/2023 in [facility] Counselor from 11/23/2022 in [facility] Office Visit from 09/30/2022 in [facility] Video Visit from 08/28/2022 in [facility] Video Visit from 07/30/2022 in [facility]  PHQ-2 Total Score 3 3 5 4 2   PHQ-9 Total Score 11 15 15 16 9       Flowsheet Row Counselor from 02/15/2023 in [facility] Counselor from 11/23/2022 in [facility] Video Visit from 08/28/2022 in [facility]  C-SSRS RISK CATEGORY Error: Q3, 4, or 5 should not be populated when Q2 is No Error: Q3, 4, or 5 should not be populated when Q2 is No Low Risk        Assessment and Plan:  Madie Cahn is a 62 yr old female who presents via Virtual Video Visit for Follow Up and Medication Management.  PPHx is significant for Depression and Anxiety.    Connie Cook has been doing well for the most part.  She did have a period of time where she was sleeping much more than normal, however, she reports that this is resolving and returning to normal.  At this time we will not make any changes to her medications.  Refills were sent in.  She will return follow-up approximately 3 months.  Discussed with her the importance of getting an EKG when she makes her appointment with her PCP.   Depression  GAD: -Continue Zoloft 150 mg daily for depression and anxiety.  90 (50 mg) tablets with 3 refills. -Continue Abilify 20 mg daily for augmentation.  30 tablets with 3 refills. -Continue Hydroxyzine 25 mg TID PRN for anxiety.  90 tablets with 3 refills. -Continue Gabapentin 300 mg TID for anxiety.  90 tablets with 3 refills. -Continue Ativan 1 mg daily PRN panic attacks.  Last fill per PDMP 05/19/2022.  No refills sent at this time.    Collaboration of Care: Collaboration of Care: Other provider involved in patient's care [facility]EB [facility]  Patient/Guardian was advised Release of  Information must be obtained prior to any record release in order to collaborate their care with an outside provider. Patient/Guardian was advised if they  have not already done so to contact the registration department to sign all necessary forms in order for [place] to release information regarding their care.   Consent: Patient/Guardian gives verbal consent for treatment and assignment of benefits for services provided during this visit. Patient/Guardian expressed understanding and agreed to proceed.    Lauro Franklin, MD 03/01/2023, 2:21 PM   Follow Up Instructions:    I discussed the assessment and treatment plan with the patient. The patient was provided an opportunity to ask questions and all were answered. The patient agreed with the plan and demonstrated an understanding of the instructions.   The patient was advised to call back or seek an in-person evaluation if the symptoms worsen or if the condition fails to improve as anticipated.  I provided 11 minutes of non-face-to-face time during this encounter.   Lauro Franklin, MD

## 2023-03-05 ENCOUNTER — Other Ambulatory Visit: Payer: Self-pay

## 2023-03-08 ENCOUNTER — Ambulatory Visit (HOSPITAL_COMMUNITY): Payer: No Payment, Other | Admitting: Licensed Clinical Social Worker

## 2023-03-08 ENCOUNTER — Encounter (HOSPITAL_COMMUNITY): Payer: Self-pay

## 2023-03-15 ENCOUNTER — Ambulatory Visit (INDEPENDENT_AMBULATORY_CARE_PROVIDER_SITE_OTHER): Payer: No Payment, Other | Admitting: Licensed Clinical Social Worker

## 2023-03-15 DIAGNOSIS — F411 Generalized anxiety disorder: Secondary | ICD-10-CM | POA: Diagnosis not present

## 2023-03-15 DIAGNOSIS — F331 Major depressive disorder, recurrent, moderate: Secondary | ICD-10-CM

## 2023-03-15 NOTE — Progress Notes (Addendum)
Virtual Visit via Video Note  I connected with Krystian [name] Kats on 03/15/23 at 10:00 AM EDT by a video enabled telemedicine application and verified that I am speaking with the correct person using two identifiers.  Location: Patient: pt's home in [place], [place] Provider: clinical office in [place], [place]   I discussed the limitations of evaluation and management by telemedicine and the availability of in person appointments. The patient expressed understanding and agreed to proceed.   I discussed the assessment and treatment plan with the patient. The patient was provided an opportunity to ask questions and all were answered. The patient agreed with the plan and demonstrated an understanding of the instructions.   The patient was advised to call back or seek an in-person evaluation if the symptoms worsen or if the condition fails to improve as anticipated.  I provided 34 minutes of non-face-to-face time during this encounter.   Wyvonnia Lora, LCSW    THERAPIST PROGRESS NOTE  Session Time: 34 minutes  Participation Level: Active  Behavioral Response: DisheveledAlertAnxious and Depressed  Type of Therapy: Individual Therapy  Treatment Goals addressed: depression  ProgressTowards Goals: variable  Interventions: CBT  Summary: Jenella Nakaiya Beddow is a 62 y.o. female who presents for f/u with this cln. She arrives on time and maintains appropriate eye contact throughout the session. She reports her cat, Brett Canales, has been missing, and she's really upset. She states he's an outdoor cat and he has been missing for three weeks. She states other people have seen him, so she knows he's around, but she misses him. She states she has been sleeping a lot, crying a lot, engaging in emotional eating, and obsessively looking out the window for him. "I don't think he went off to die. I think he just went off to do what cats do." She denies SI. When asked if she is tending to her hygiene  and still attending Bible study, she states "I could be better." She states she has been isolating. She states she thinks she would like to be doing better. She states that she has to "give it to god and pray to keep him safe and bring him back home" in response to being asked what she can do to help pull herself out of her current state. With prompting, she states she needs to ensure she has healthy foods in her home. She states listening to music helps her feel good and that she has been doing this. She states she talks to her sister and nephew-in-law about Brett Canales. She states she spends time with her great nephew. She states she is sleeping about 12 hours per day. She talks about her love of animals and is receptive to feedback. She states there is nothing else she would like to discuss at this time and requests f/u appt in 2 weeks.   Suicidal/Homicidal: Nowithout intent/plan  Therapist Response: Cln assessed for current stressors, symptoms, and safety since last session. Cln utilized active listening and validation to assist with processing. Cln explored ways in which pt could utilize learned coping skills to improve depression symptoms before making suggestions. Cln asked pt how she would feel about visiting a cat cafe and possibly adopting a cat. Cln emphasized the importance of maintaining contact with others while struggling with depression. Cln scheduled follow-up appointment and confirmed pt's availability and preferred method of service delivery (virtual).  Plan: Return again in 2 weeks.  Diagnosis: Generalized anxiety disorder  MDD (major depressive disorder), recurrent episode, moderate (HCC)  Collaboration of Care: Other none required for this visit  Patient/Guardian was advised Release of Information must be obtained prior to any record release in order to collaborate their care with an outside provider. Patient/Guardian was advised if they have not already done so to contact the  registration department to sign all necessary forms in order for [place] to release information regarding their care.   Consent: Patient/Guardian gives verbal consent for treatment and assignment of benefits for services provided during this visit. Patient/Guardian expressed understanding and agreed to proceed.   Wyvonnia Lora, LCSW 03/15/2023

## 2023-03-29 ENCOUNTER — Ambulatory Visit (INDEPENDENT_AMBULATORY_CARE_PROVIDER_SITE_OTHER): Payer: No Payment, Other | Admitting: Licensed Clinical Social Worker

## 2023-03-29 DIAGNOSIS — F411 Generalized anxiety disorder: Secondary | ICD-10-CM

## 2023-03-29 DIAGNOSIS — F331 Major depressive disorder, recurrent, moderate: Secondary | ICD-10-CM

## 2023-03-29 NOTE — Progress Notes (Signed)
Virtual Visit via Video Note  I connected with Connie Cook on 03/29/23 at 10:00 AM EDT by a video enabled telemedicine application and verified that I am speaking with the correct person using two identifiers.  Location: Patient: pt's home in [place], [place] Provider: clinical office in [place], [place]   I discussed the limitations of evaluation and management by telemedicine and the availability of in person appointments. The patient expressed understanding and agreed to proceed.   I discussed the assessment and treatment plan with the patient. The patient was provided an opportunity to ask questions and all were answered. The patient agreed with the plan and demonstrated an understanding of the instructions.   The patient was advised to call back or seek an in-person evaluation if the symptoms worsen or if the condition fails to improve as anticipated.  I provided 54 minutes of non-face-to-face time during this encounter.   Wyvonnia Lora, LCSW    THERAPIST PROGRESS NOTE  Session Time: 54 minutes  Participation Level: Active  Behavioral Response: DisheveledAlertAnxious and Depressed  Type of Therapy: Individual Therapy  Treatment Goals addressed: assertiveness, self-esteem  ProgressTowards Goals: variable  Interventions: CBT  Summary: Connie Cook is a 62 y.o. female who presents for f/u with this cln. She arrives on time and maintains appropriate eye contact throughout the session. She reports she is not feeling well due to allergies. She states her cat Brett Canales returned home later in the day after last session. She states her mood has been "up and down" and that she has been sleeping a lot due to allergies and taking medicine for them. Cln reminds pt of PCP appt tomorrow, confirms time, and urges her to keep her appt. Cln reminds her of the need to discuss memory issues and that she needs an EKG, per Dr. Renaldo Fiddler. When asked what she would like to discuss  during therapy, she brings up her sister and her making pt feel invalidated. She discusses how she wants a full bedspread and sheet set and her sister says she only has a blanket, so pt should not need one. "Why does she have to bring me down?" Pt verbalizes that she know she needs to be assertive. She states she wishes her sister would stop sharing her opinions and listen. She states that she has not told her this because of how much her sister has helped her. "I just feel like it would be petty to make a big deal out of it." She discusses her fears related to addressing this and then discusses details of how her parents treated her. She shares that she was a preemie and her parents were not only over-protective, but treated her in a way that indicated they believed her untrustworthy and incompetent. She states her sister was not treated this way. Pt reports she recognizes that she is cut off from her feelings when in session and around others, as she habitually laughs nervously when discussing uncomfortable and emotional topics. She is receptive to feedback.   Suicidal/Homicidal: Nowithout intent/plan  Therapist Response: Cln assessed for current stressors, symptoms, and safety since last session. Cln utilized active listening and validation to assist with processing. Cln reminds pt of assertiveness skills and reviews "I" statements. Cln explored pt's fears and asked pt about realistic worst-case scenarios. Cln reflected pt's feelings regarding childhood and validated the difficulty of gaining self-esteem in adulthood. Cln attempted to reframe her parents' treatment as being overprotective in an unhelpful way. Cln recommended the Self-Love Workbook for  Women and emailed pt link. Cln scheduled follow-up appointment and confirmed pt's availability and preferred method of service delivery (virtual).  Plan: Return again in 3 weeks.  Diagnosis: Generalized anxiety disorder  Collaboration of Care: Psychiatrist  AEB cln reminded pt of the need for EKG per psychiatrist  Patient/Guardian was advised Release of Information must be obtained prior to any record release in order to collaborate their care with an outside provider. Patient/Guardian was advised if they have not already done so to contact the registration department to sign all necessary forms in order for [place] to release information regarding their care.   Consent: Patient/Guardian gives verbal consent for treatment and assignment of benefits for services provided during this visit. Patient/Guardian expressed understanding and agreed to proceed.   Wyvonnia Lora, LCSW 03/29/2023

## 2023-03-30 ENCOUNTER — Encounter: Payer: Self-pay | Admitting: Family Medicine

## 2023-03-30 ENCOUNTER — Other Ambulatory Visit (HOSPITAL_COMMUNITY)
Admission: RE | Admit: 2023-03-30 | Discharge: 2023-03-30 | Disposition: A | Discharge status: Home / Self Care | Payer: Self-pay | Source: Ambulatory Visit | Attending: Family Medicine | Admitting: Family Medicine

## 2023-03-30 ENCOUNTER — Ambulatory Visit (HOSPITAL_COMMUNITY)
Admission: RE | Admit: 2023-03-30 | Discharge: 2023-03-30 | Disposition: A | Discharge status: Home / Self Care | Payer: Self-pay | Source: Ambulatory Visit | Attending: Family Medicine | Admitting: Family Medicine

## 2023-03-30 ENCOUNTER — Ambulatory Visit: Payer: Self-pay | Attending: Family Medicine | Admitting: Family Medicine

## 2023-03-30 ENCOUNTER — Other Ambulatory Visit: Payer: Self-pay

## 2023-03-30 VITALS — BP 111/75 | HR 78 | Ht 60.0 in | Wt 198.6 lb

## 2023-03-30 DIAGNOSIS — Z23 Encounter for immunization: Secondary | ICD-10-CM

## 2023-03-30 DIAGNOSIS — B379 Candidiasis, unspecified: Secondary | ICD-10-CM

## 2023-03-30 DIAGNOSIS — Z0001 Encounter for general adult medical examination with abnormal findings: Secondary | ICD-10-CM | POA: Insufficient documentation

## 2023-03-30 DIAGNOSIS — R7303 Prediabetes: Secondary | ICD-10-CM | POA: Insufficient documentation

## 2023-03-30 DIAGNOSIS — Z124 Encounter for screening for malignant neoplasm of cervix: Secondary | ICD-10-CM | POA: Insufficient documentation

## 2023-03-30 DIAGNOSIS — Z79899 Other long term (current) drug therapy: Secondary | ICD-10-CM | POA: Insufficient documentation

## 2023-03-30 DIAGNOSIS — N3281 Overactive bladder: Secondary | ICD-10-CM | POA: Insufficient documentation

## 2023-03-30 DIAGNOSIS — L4050 Arthropathic psoriasis, unspecified: Secondary | ICD-10-CM | POA: Insufficient documentation

## 2023-03-30 DIAGNOSIS — Z0000 Encounter for general adult medical examination without abnormal findings: Secondary | ICD-10-CM

## 2023-03-30 DIAGNOSIS — Z1231 Encounter for screening mammogram for malignant neoplasm of breast: Secondary | ICD-10-CM | POA: Insufficient documentation

## 2023-03-30 DIAGNOSIS — E785 Hyperlipidemia, unspecified: Secondary | ICD-10-CM | POA: Insufficient documentation

## 2023-03-30 DIAGNOSIS — Z7182 Exercise counseling: Secondary | ICD-10-CM | POA: Insufficient documentation

## 2023-03-30 MED ORDER — SOLIFENACIN SUCCINATE 5 MG PO TABS
5.0000 mg | ORAL_TABLET | Freq: Every day | ORAL | 1 refills | Status: DC
Start: 2023-03-30 — End: 2023-11-15
  Filled 2023-03-30: qty 30, 30d supply, fill #0

## 2023-03-30 MED ORDER — NYSTATIN 100000 UNIT/GM EX CREA
1.0000 | TOPICAL_CREAM | Freq: Two times a day (BID) | CUTANEOUS | 2 refills | Status: DC
Start: 2023-03-30 — End: 2023-10-04
  Filled 2023-03-30: qty 30, 15d supply, fill #0
  Filled 2023-06-19: qty 30, 15d supply, fill #1

## 2023-03-30 NOTE — Progress Notes (Signed)
Subjective:  Patient ID: Connie Cook, female    DOB: 09-03-1961  Age: 62 y.o. MRN: [id number]  CC: Annual Exam and Gynecologic Exam   HPI Merelyn Shaila Hardin is a 62 y.o. 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 She uses antibiotics ointment. She Complains of itching in her right labia off and on for a couple of months. She has no discharge. She previously wore poise pads due to having an overactive bladder but now no longer wears them.  Today she is due for breast cancer, colon cancer and cervical cancer screening. Her psychiatrist wanted her to have an EKG due to the fact that she is on psychotropic medications. Past Medical History:  Diagnosis Date   Anxiety    Arthritis    DDD (degenerative disc disease), cervical    Depression    H/O adenoidectomy    History of removal of ovarian cyst    Hyperlipidemia     Past Surgical History:  Procedure Laterality Date   APPENDECTOMY      Family History  Problem Relation Age of Onset   Depression Mother    Depression Father    Alcohol abuse Father    Alcohol abuse Maternal Uncle    Depression Paternal Uncle    Alcohol abuse Paternal Uncle    Depression Cousin     Social History   Socioeconomic History   Marital status: Single    Spouse name: Not on file   Number of children: 0   Years of education: Not on file   Highest education level: Some college, no degree  Occupational History   Not on file  Tobacco Use   Smoking status: Former   Smokeless tobacco: Former  Building services engineer Use: Never used  Substance and Sexual Activity   Alcohol use: Never   Drug use: Never   Sexual activity: Not on file  Other Topics Concern   Not on file   Social History Narrative   Not on file   Social Determinants of Health   Financial Resource Strain: Not on file  Food Insecurity: Not on file  Transportation Needs: Not on file  Physical Activity: Not on file  Stress: Not on file  Social Connections: Not on file    Allergies  Allergen Reactions   Codeine Shortness Of Breath   Penicillins Rash   Tramadol Itching    Outpatient Medications Prior to Visit  Medication Sig Dispense Refill   albuterol (VENTOLIN HFA) 108 (90 Base) MCG/ACT inhaler Inhale 2 puffs into the lungs every 4 (four) hours as needed for wheezing or shortness of breath. 6.7 g 3   ARIPiprazole (ABILIFY) 20 MG tablet Take 1 tablet (20 mg total) by mouth once daily. 30 tablet 3   atorvastatin (LIPITOR) 40 MG tablet Take 1 tablet (40 mg total) by mouth daily. 90 tablet 1   gabapentin (NEURONTIN) 300 MG capsule Take 1 capsule (300 mg total) by mouth 3 (three) times daily. 90 capsule 3   hydrOXYzine (ATARAX) 25 MG tablet Take 1 tablet (25 mg total) by mouth 3 (three) times daily as needed. 90 tablet 3   LORazepam (ATIVAN) 1 MG tablet Take 1 mg by mouth daily.     meloxicam (MOBIC) 7.5  MG tablet Take 1 tablet (7.5 mg total) by mouth daily. 30 tablet 1   methocarbamol (ROBAXIN-750) 750 MG tablet Take 1 tablet (750 mg total) by mouth every 8 (eight) hours as needed for muscle spasms. 90 tablet 1   sertraline (ZOLOFT) 50 MG tablet Take 3 tablets (150 mg total) by mouth once once daily. 90 tablet 3   Probiotic Product (PROBIOTIC DAILY PO) Take by mouth.     No facility-administered medications prior to visit.     ROS Review of Systems  Constitutional:  Negative for activity change and appetite change.  HENT:  Negative for sinus pressure and sore throat.   Respiratory:  Negative for chest tightness, shortness of breath and wheezing.   Cardiovascular:  Negative for chest pain and palpitations.  Gastrointestinal:  Negative for abdominal distention, abdominal pain and  constipation.  Genitourinary: Negative.   Musculoskeletal: Negative.   Skin:  Positive for rash.  Psychiatric/Behavioral:  Negative for behavioral problems and dysphoric mood.     Objective:  BP 111/75   Pulse 78   Ht 5' (1.524 m)   Wt 198 lb 9.6 oz (90.1 kg)   SpO2 96%   BMI 38.79 kg/m      03/30/2023    2:52 PM 09/30/2022   10:37 AM 06/25/2022    8:58 AM  BP/Weight  Systolic BP 111 121 121  Diastolic BP 75 78 79  Wt. (Lbs) 198.6 200.6 224  BMI 38.79 kg/m2 39.18 kg/m2 43.75 kg/m2      Physical Exam Exam conducted with a chaperone present.  Constitutional:      General: She is not in acute distress.    Appearance: She is well-developed. She is not diaphoretic.  HENT:     Head: Normocephalic.     Right Ear: External ear normal.     Left Ear: External ear normal.     Nose: Nose normal.  Eyes:     Conjunctiva/sclera: Conjunctivae normal.     Pupils: Pupils are equal, round, and reactive to light.  Neck:     Vascular: No JVD.  Cardiovascular:     Rate and Rhythm: Normal rate and regular rhythm.     Heart sounds: Normal heart sounds. No murmur heard.    No gallop.  Pulmonary:     Effort: Pulmonary effort is normal. No respiratory distress.     Breath sounds: Normal breath sounds. No wheezing or rales.  Chest:     Chest wall: No tenderness.  Breasts:    Right: Normal. No mass, nipple discharge or tenderness.     Left: Normal. No mass, nipple discharge or tenderness.  Abdominal:     General: Bowel sounds are normal. There is no distension.     Palpations: Abdomen is soft. There is no mass.     Tenderness: There is no abdominal tenderness.     Hernia: There is no hernia in the left inguinal area or right inguinal area.  Genitourinary:    General: Normal vulva.     Pubic Area: No rash.      Labia:        Right: Rash (eythematous) present.        Left: No rash.      Vagina: Normal.     Cervix: Normal.     Uterus: Normal.      Adnexa: Right adnexa normal and  left adnexa normal.       Right: No tenderness.         Left: No  tenderness.    Musculoskeletal:        General: No tenderness. Normal range of motion.     Cervical back: Normal range of motion. No tenderness.  Lymphadenopathy:     Upper Body:     Right upper body: No supraclavicular or axillary adenopathy.     Left upper body: No supraclavicular or axillary adenopathy.  Skin:    General: Skin is warm and dry.     Comments: Scar from previous pilonidal cyst in superior aspect of gluteal calf.  Medial aspects of both gluteus muscles with erythematous rash extending around the gluteal cleft  Neurological:     Mental Status: She is alert and oriented to person, place, and time.     Deep Tendon Reflexes: Reflexes are normal and symmetric.        Latest Ref Rng & Units 09/30/2022   11:33 AM 06/25/2022    9:45 AM  CMP  Glucose 70 - 99 mg/dL 97  94   BUN 8 - 27 mg/dL 10  11   Creatinine 1.61 - 1.00 mg/dL 0.96  0.45   Sodium 409 - 144 mmol/L 140  144   Potassium 3.5 - 5.2 mmol/L 4.3  4.0   Chloride 96 - 106 mmol/L 101  103   CO2 20 - 29 mmol/L 26  23   Calcium 8.7 - 10.3 mg/dL 9.7  9.2   Total Protein 6.0 - 8.5 g/dL 7.2  7.6   Total Bilirubin 0.0 - 1.2 mg/dL 0.4  0.4   Alkaline Phos 44 - 121 IU/L 91  91   AST 0 - 40 IU/L 22  27   ALT 0 - 32 IU/L 14  23     Lipid Panel     Component Value Date/Time   CHOL 252 (H) 09/30/2022 1133   TRIG 173 (H) 09/30/2022 1133   HDL 44 09/30/2022 1133   LDLCALC 176 (H) 09/30/2022 1133    CBC    Component Value Date/Time   WBC 7.7 06/25/2022 0945   RBC 5.06 06/25/2022 0945   HGB 13.7 06/25/2022 0945   HCT 41.7 06/25/2022 0945   PLT 341 06/25/2022 0945   MCV 82 06/25/2022 0945   MCH 27.1 06/25/2022 0945   MCHC 32.9 06/25/2022 0945   RDW 12.9 06/25/2022 0945   LYMPHSABS 1.8 06/25/2022 0945   EOSABS 0.2 06/25/2022 0945   BASOSABS 0.1 06/25/2022 0945    Lab Results  Component Value Date   HGBA1C 6.3 (H) 06/25/2022    Assessment &  Plan:  1. Annual physical exam Counseled on 150 minutes of exercise per week, healthy eating (including decreased daily intake of saturated fats, cholesterol, added sugars, sodium), routine healthcare maintenance.  - LP+Non-HDL Cholesterol; Future - CMP14+EGFR; Future - CBC with Differential/Platelet; Future  2. Encounter for screening mammogram for malignant neoplasm of breast - MS 3D SCR MAMMO BILAT BR (aka MM); Future  3. Screening for cervical cancer - Cytology - PAP  4. Candida infection Please avoid wearing tight clothing and to allow adequate aeration to gluteal cleft.  - nystatin cream (MYCOSTATIN); Apply 1 Application topically 2 (two) times daily.  Dispense: 30 g; Refill: 2  5. Overactive bladder Consult on Kegel exercises - solifenacin (VESICARE) 5 MG tablet; Take 1 tablet (5 mg total) by mouth daily.  Dispense: 90 tablet; Refill: 1  6. Prediabetes Labs reveal prediabetes with an A1c of 6.3.  Working on a low carbohydrate diet, exercise, weight loss is recommended in order to  prevent progression to type 2 diabetes mellitus.  - Hemoglobin A1c; Future  7. High risk medication use EKG revealed possible left atrial enlargement otherwise no ischemic changes    Meds ordered this encounter  Medications   solifenacin (VESICARE) 5 MG tablet    Sig: Take 1 tablet (5 mg total) by mouth daily.    Dispense:  90 tablet    Refill:  1   nystatin cream (MYCOSTATIN)    Sig: Apply 1 Application topically 2 (two) times daily.    Dispense:  30 g    Refill:  2    Follow-up: Return in about 6 months (around 09/30/2023) for Chronic medical conditions.       Hoy Register, MD, FAAFP. [facility] [place], [place] [phone]   03/30/2023, 4:14 PM

## 2023-03-30 NOTE — Progress Notes (Signed)
CPE Vaginal itching Sore on buttocks.

## 2023-03-30 NOTE — Patient Instructions (Signed)

## 2023-03-31 ENCOUNTER — Other Ambulatory Visit: Payer: Self-pay

## 2023-03-31 ENCOUNTER — Ambulatory Visit: Payer: Self-pay | Attending: Family Medicine

## 2023-03-31 DIAGNOSIS — R7303 Prediabetes: Secondary | ICD-10-CM

## 2023-03-31 DIAGNOSIS — Z0000 Encounter for general adult medical examination without abnormal findings: Secondary | ICD-10-CM

## 2023-04-01 ENCOUNTER — Other Ambulatory Visit: Payer: Self-pay | Admitting: Family Medicine

## 2023-04-01 ENCOUNTER — Other Ambulatory Visit: Payer: Self-pay

## 2023-04-01 LAB — CMP14+EGFR
ALT: 14 IU/L (ref 0–32)
AST: 23 IU/L (ref 0–40)
Albumin/Globulin Ratio: 1.3 (ref 1.2–2.2)
Albumin: 4.1 g/dL (ref 3.9–4.9)
Alkaline Phosphatase: 118 IU/L (ref 44–121)
BUN/Creatinine Ratio: 27 (ref 12–28)
BUN: 21 mg/dL (ref 8–27)
Bilirubin Total: 0.6 mg/dL (ref 0.0–1.2)
CO2: 23 mmol/L (ref 20–29)
Calcium: 9.6 mg/dL (ref 8.7–10.3)
Chloride: 102 mmol/L (ref 96–106)
Creatinine, Ser: 0.77 mg/dL (ref 0.57–1.00)
Globulin, Total: 3.1 g/dL (ref 1.5–4.5)
Glucose: 95 mg/dL (ref 70–99)
Potassium: 4.7 mmol/L (ref 3.5–5.2)
Sodium: 140 mmol/L (ref 134–144)
Total Protein: 7.2 g/dL (ref 6.0–8.5)
eGFR: 88 mL/min/{1.73_m2} (ref >59)

## 2023-04-01 LAB — CBC WITH DIFFERENTIAL/PLATELET
Basophils Absolute: 0.1 10*3/uL (ref 0.0–0.2)
Basos: 1 %
EOS (ABSOLUTE): 0.1 10*3/uL (ref 0.0–0.4)
Eos: 2 %
Hematocrit: 40 % (ref 34.0–46.6)
Hemoglobin: 13.3 g/dL (ref 11.1–15.9)
Immature Grans (Abs): 0 10*3/uL (ref 0.0–0.1)
Immature Granulocytes: 0 %
Lymphocytes Absolute: 1.4 10*3/uL (ref 0.7–3.1)
Lymphs: 17 %
MCH: 28.2 pg (ref 26.6–33.0)
MCHC: 33.3 g/dL (ref 31.5–35.7)
MCV: 85 fL (ref 79–97)
Monocytes Absolute: 0.6 10*3/uL (ref 0.1–0.9)
Monocytes: 8 %
Neutrophils Absolute: 5.9 10*3/uL (ref 1.4–7.0)
Neutrophils: 72 %
Platelets: 354 10*3/uL (ref 150–450)
RBC: 4.72 x10E6/uL (ref 3.77–5.28)
RDW: 11.9 % (ref 11.7–15.4)
WBC: 8.1 10*3/uL (ref 3.4–10.8)

## 2023-04-01 LAB — LP+NON-HDL CHOLESTEROL
Cholesterol, Total: 245 mg/dL — ABNORMAL HIGH (ref 100–199)
HDL: 51 mg/dL (ref >39)
LDL Chol Calc (NIH): 176 mg/dL — ABNORMAL HIGH (ref 0–99)
Total Non-HDL-Chol (LDL+VLDL): 194 mg/dL — ABNORMAL HIGH (ref 0–129)
Triglycerides: 100 mg/dL (ref 0–149)
VLDL Cholesterol Cal: 18 mg/dL (ref 5–40)

## 2023-04-01 LAB — HEMOGLOBIN A1C
Est. average glucose Bld gHb Est-mCnc: 126 mg/dL
Hgb A1c MFr Bld: 6 % — ABNORMAL HIGH (ref 4.8–5.6)

## 2023-04-01 MED ORDER — ATORVASTATIN CALCIUM 80 MG PO TABS
80.0000 mg | ORAL_TABLET | Freq: Every day | ORAL | 1 refills | Status: DC
  Filled 2023-04-01 – 2023-08-18 (×2): qty 90, 90d supply, fill #0
  Filled 2024-03-03: qty 90, 90d supply, fill #1
  Filled 2024-03-10: qty 90, 90d supply, fill #0

## 2023-04-05 LAB — CYTOLOGY - PAP
Adequacy: ABSENT
Comment: NEGATIVE
Diagnosis: NEGATIVE
High risk HPV: NEGATIVE

## 2023-04-08 ENCOUNTER — Other Ambulatory Visit: Payer: Self-pay

## 2023-04-15 ENCOUNTER — Encounter: Payer: Self-pay | Admitting: Family Medicine

## 2023-04-15 ENCOUNTER — Other Ambulatory Visit: Payer: Self-pay | Admitting: Family Medicine

## 2023-04-16 ENCOUNTER — Telehealth (HOSPITAL_COMMUNITY): Payer: Self-pay

## 2023-04-16 ENCOUNTER — Other Ambulatory Visit: Payer: Self-pay | Admitting: Family Medicine

## 2023-04-16 DIAGNOSIS — F419 Anxiety disorder, unspecified: Secondary | ICD-10-CM

## 2023-04-16 NOTE — Telephone Encounter (Signed)
Medication request - Telephone call with patient, after she left a message requesting a call back and for Dr. Renaldo Fiddler to take over writing her new prescriptions for Lorazepam 1 mg daily. Informed patient this nurse was not sure Dr. Renaldo Fiddler would be willing to take over this prescription, but patient stated he was aware her PCP was taking a more leadership position and that he agreed to assist if needed.  Patient last seen 03/01/23 and returns next to see Dr. Hazle Quant on 05/25/23.

## 2023-04-19 ENCOUNTER — Ambulatory Visit (INDEPENDENT_AMBULATORY_CARE_PROVIDER_SITE_OTHER): Payer: No Payment, Other | Admitting: Licensed Clinical Social Worker

## 2023-04-19 ENCOUNTER — Telehealth (HOSPITAL_COMMUNITY): Payer: Self-pay

## 2023-04-19 DIAGNOSIS — F411 Generalized anxiety disorder: Secondary | ICD-10-CM | POA: Diagnosis not present
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
## 2023-04-19 NOTE — Telephone Encounter (Signed)
Medication management - Telephone call with patient to follow up with her request for Dr. Renaldo Fiddler to take over writing her currently prescribed Lorazepam. Informed patient, after discussion with provider, this was not something he was in favor of doing and this is something she would need to discuss further with her provider that started her on the medication.  Patient agreed to call their office to discuss as her assigned provider there would be changing.  Patient agreed with plan and will call back if any issues.

## 2023-05-03 ENCOUNTER — Ambulatory Visit (HOSPITAL_COMMUNITY): Payer: Self-pay | Admitting: Licensed Clinical Social Worker

## 2023-05-03 ENCOUNTER — Encounter (HOSPITAL_COMMUNITY): Payer: Self-pay

## 2023-05-23 NOTE — Progress Notes (Unsigned)
BH MD Outpatient Progress Note  Virtual Visit via Video Note   I connected with Connie Cook on 03/01/23 at  2:00 PM EDT by a video enabled telemedicine application and verified that I am speaking with the correct person using two identifiers.   Location: Patient: Home Provider: [facility]   I discussed the limitations of evaluation and management by telemedicine and the availability of in person appointments. The patient expressed understanding and agreed to proceed.  05/25/2023 12:13 PM Connie Cook  MRN:  [id number]  Assessment:  Connie Cook presents for follow-up evaluation in-person. Today, 05/25/23, patient reports worsening suicidalit. Patient is a follow up from Dr. Renaldo Fiddler and was last seen by him 03/01/23. She currently reports overall stability  with current regiment of Abilify 20 mg daily, gabapentin 300 mg tid, and zoloft 50 mg daily. She reports improved anxiety secondary to probiotic supplement as well as Restore nutritional supplement.  She was agreeable to not having any of her medications adjusted to be given current improvement with regards to depression and anxiety.  Patient to follow-up in 2-3 months.  She continues to attend [name] which has been beneficial to her.  Identifying Information: Connie Cook is a 62 y.o. female with a history of depression and GAD who is an established patient with [facility] for medication management.   Plan:  # Generalized anxiety disorder Past medication trials:  Status of problem: improving Interventions: -- Continue Zoloft 150 mg daily -- Continue gabapentin 300 mg 3 times daily   # Major Depressive Disorder Past medication trials:  Status of problem: stable Interventions: -- Continue Zoloft as above --Continue psychotherapy --Continue Abilify 20 mg daily   Patient was given contact information for behavioral health clinic and was instructed to call 911 for  emergencies.   Subjective:  Chief Complaint:  Chief Complaint  Patient presents with   Medication Management    Interval History:    Ports that she has been doing well recently stating that her anxiety has been better controlled as well as having far fewer "anxiety attacks".  She attributes her improvement in mood recently due to taking probiotic as well as Restore nutritional supplementation.  She does report feeling occasionally perseverating on certain things that bother her but overall is able to redirect herself.  She denies SI/HI/AVH and is able to contract for safety stating that she would reach out to her therapist should something emergent,.  She reports sleep is 6 to 8 hours a night and feels well rested when she wakes up.  She states that her appetite has been okay but states she will eat too much sugar on occasion.  She reports no acute concerns at this time and will is agreeable to following up in 2 to 3 months.  Visit Diagnosis:    ICD-10-CM   1. Mild depression  F32.A ARIPiprazole (ABILIFY) 20 MG tablet    sertraline (ZOLOFT) 50 MG tablet    2. Generalized anxiety disorder  F41.1 gabapentin (NEURONTIN) 300 MG capsule    hydrOXYzine (ATARAX) 25 MG tablet      Past Psychiatric History:  Diagnoses: Depression and anxiety  Past Medical History:  Past Medical History:  Diagnosis Date   Anxiety    Arthritis    DDD (degenerative disc disease), cervical    Depression    H/O adenoidectomy    History of removal of ovarian cyst    Hyperlipidemia     Past Surgical History:  Procedure Laterality  Date   APPENDECTOMY       Family History:  Family History  Problem Relation Age of Onset   Depression Mother    Depression Father    Alcohol abuse Father    Alcohol abuse Maternal Uncle    Depression Paternal Uncle    Alcohol abuse Paternal Uncle    Depression Cousin     Social History:   Social History   Socioeconomic History   Marital status: Single    Spouse  name: Not on file   Number of children: 0   Years of education: Not on file   Highest education level: Some college, no degree  Occupational History   Not on file  Tobacco Use   Smoking status: Former   Smokeless tobacco: Former  Building services engineer Use: Never used  Substance and Sexual Activity   Alcohol use: Never   Drug use: Never   Sexual activity: Not on file  Other Topics Concern   Not on file  Social History Narrative   Not on file   Social Determinants of Health   Financial Resource Strain: Not on file  Food Insecurity: Not on file  Transportation Needs: Not on file  Physical Activity: Not on file  Stress: Not on file  Social Connections: Not on file    Allergies:  Allergies  Allergen Reactions   Codeine Shortness Of Breath   Penicillins Rash   Tramadol Itching    Current Medications: Current Outpatient Medications  Medication Sig Dispense Refill   albuterol (VENTOLIN HFA) 108 (90 Base) MCG/ACT inhaler Inhale 2 puffs into the lungs every 4 (four) hours as needed for wheezing or shortness of breath. 6.7 g 3   ARIPiprazole (ABILIFY) 20 MG tablet Take 1 tablet (20 mg total) by mouth once daily. 30 tablet 3   atorvastatin (LIPITOR) 80 MG tablet Take 1 tablet (80 mg total) by mouth daily. 90 tablet 1   gabapentin (NEURONTIN) 300 MG capsule Take 1 capsule (300 mg total) by mouth 3 (three) times daily. 90 capsule 3   hydrOXYzine (ATARAX) 25 MG tablet Take 1 tablet (25 mg total) by mouth 3 (three) times daily as needed. 90 tablet 3   LORazepam (ATIVAN) 1 MG tablet Take 1 mg by mouth daily.     meloxicam (MOBIC) 7.5 MG tablet Take 1 tablet (7.5 mg total) by mouth daily. 30 tablet 1   methocarbamol (ROBAXIN-750) 750 MG tablet Take 1 tablet (750 mg total) by mouth every 8 (eight) hours as needed for muscle spasms. 90 tablet 1   mupirocin ointment (BACTROBAN) 2 % Apply 1 Application topically 2 (two) times daily. To affected area till better 22 g 0   nystatin cream  (MYCOSTATIN) Apply 1 Application topically 2 (two) times daily. 30 g 2   Probiotic Product (PROBIOTIC DAILY PO) Take by mouth.     sertraline (ZOLOFT) 50 MG tablet Take 3 tablets (150 mg total) by mouth once once daily. 90 tablet 3   solifenacin (VESICARE) 5 MG tablet Take 1 tablet (5 mg total) by mouth daily. 90 tablet 1   No current facility-administered medications for this visit.    ROS: Review of Systems  Objective:  Psychiatric Specialty Exam: There were no vitals taken for this visit.There is no height or weight on file to calculate BMI.  General Appearance: Casual  Eye Contact:  Good  Speech:  Clear and Coherent and Normal Rate  Volume:  Normal  Mood:  Euthymic  Affect:  Appropriate  and Congruent  Thought Content: Logical   Suicidal Thoughts:  No  Homicidal Thoughts:  No  Thought Process:  Coherent, Goal Directed, and Linear  Orientation:  Full (Time, Place, and Person)    Memory: Grossly intact   Judgment:  Fair  Insight:  Fair  Concentration:  Concentration: Good and Attention Span: Good  Recall: not formally assessed   Fund of Knowledge: Fair  Language: Fair  Psychomotor Activity:  Normal  Akathisia:  No  AIMS (if indicated): not done  Assets:  Manufacturing systems engineer Desire for Improvement Financial Resources/Insurance Housing Leisure Time Physical Health Resilience  ADL's:  Intact  Cognition: WNL  Sleep:  Good   PE: General: well-appearing; no acute distress  Pulm: no increased work of breathing on room air  Strength & Muscle Tone: within normal limits Neuro: no focal neurological deficits observed  Gait & Station: not assessed due to virtual visit  Metabolic Disorder Labs: Lab Results  Component Value Date   HGBA1C 6.0 (H) 03/31/2023   No results found for: "PROLACTIN" Lab Results  Component Value Date   CHOL 245 (H) 03/31/2023   TRIG 100 03/31/2023   HDL 51 03/31/2023   LDLCALC 176 (H) 03/31/2023   LDLCALC 176 (H) 09/30/2022   No  results found for: "TSH"  Therapeutic Level Labs: No results found for: "LITHIUM" No results found for: "VALPROATE" No results found for: "CBMZ"  Screenings:  GAD-7    Flowsheet Row Office Visit from 03/30/2023 in [facility] Counselor from 02/15/2023 in [facility] Counselor from 11/23/2022 in [facility] Office Visit from 09/30/2022 in [facility] Video Visit from 07/30/2022 in [facility]  Total GAD-7 Score 5 9 16 17 8       PHQ2-9    Flowsheet Row Office Visit from 03/30/2023 in [facility] Counselor from 02/15/2023 in [facility] Counselor from 11/23/2022 in [facility] Office Visit from 09/30/2022 in [facility] Video Visit from 08/28/2022 in [facility]  PHQ-2 Total Score 2 3 3 5 4   PHQ-9 Total Score 14 11 15 15 16       Flowsheet Row ED from 05/24/2023 in [facility] from 02/15/2023 in [facility] Counselor from 11/23/2022 in [facility]  C-SSRS RISK CATEGORY No Risk Error: Q3, 4, or 5 should not be populated when Q2 is No Error: Q3, 4, or 5 should not be populated when Q2 is No       Collaboration of Care: Collaboration of Care:   Patient/Guardian was advised Release of Information must be obtained prior to any record release in order to collaborate their care with an outside provider. Patient/Guardian was advised if they have not already done so to contact the registration department to sign all necessary forms in order for [place] to release information regarding their care.   Consent: Patient/Guardian gives verbal consent for treatment and assignment of benefits for services provided during this  visit. Patient/Guardian expressed understanding and agreed to proceed.   A total of 30 minutes was spent involved in face to face clinical care, chart review, documentation.   Park Pope, MD 05/25/2023, 12:13 PM

## 2023-05-24 ENCOUNTER — Ambulatory Visit (HOSPITAL_COMMUNITY)
Admission: EM | Admit: 2023-05-24 | Discharge: 2023-05-24 | Disposition: A | Discharge status: Home / Self Care | Payer: Medicare Other | Source: Home / Self Care | Attending: Family Medicine | Admitting: Family Medicine

## 2023-05-24 ENCOUNTER — Other Ambulatory Visit: Payer: Self-pay

## 2023-05-24 ENCOUNTER — Telehealth (HOSPITAL_COMMUNITY): Payer: Self-pay

## 2023-05-24 ENCOUNTER — Encounter (HOSPITAL_COMMUNITY): Payer: Self-pay

## 2023-05-24 DIAGNOSIS — Z23 Encounter for immunization: Secondary | ICD-10-CM | POA: Diagnosis not present

## 2023-05-24 DIAGNOSIS — S41112A Laceration without foreign body of left upper arm, initial encounter: Secondary | ICD-10-CM | POA: Diagnosis not present

## 2023-05-24 MED ORDER — TETANUS-DIPHTH-ACELL PERTUSSIS 5-2.5-18.5 LF-MCG/0.5 IM SUSY
PREFILLED_SYRINGE | INTRAMUSCULAR | Status: AC
  Filled 2023-05-24: qty 0.5

## 2023-05-24 MED ORDER — MUPIROCIN 2 % EX OINT
1.0000 | TOPICAL_OINTMENT | Freq: Two times a day (BID) | CUTANEOUS | 0 refills | Status: DC
  Filled 2023-05-24: qty 22, 11d supply, fill #0

## 2023-05-24 MED ORDER — TETANUS-DIPHTH-ACELL PERTUSSIS 5-2.5-18.5 LF-MCG/0.5 IM SUSY
0.5000 mL | PREFILLED_SYRINGE | Freq: Once | INTRAMUSCULAR | Status: AC
  Administered 2023-05-24: 0.5 mL via INTRAMUSCULAR

## 2023-05-24 MED ORDER — MUPIROCIN 2 % EX OINT: 1.0000 | TOPICAL_OINTMENT | Freq: Two times a day (BID) | CUTANEOUS | 0 refills | Status: DC

## 2023-05-24 NOTE — Discharge Instructions (Signed)
Put mupirocin ointment on the cut twice daily until improved  You can wash the cut twice daily and then put new antibiotic ointment on it and a dressing.  After the healing is progressing, then he can go to once daily dressing changes  You have been given a Tdap vaccination to boost your tetanus immunity

## 2023-05-24 NOTE — ED Triage Notes (Signed)
Pt presents with laceration to her right forearm.Pt was using an utility knife. Tetanus status is unknown.

## 2023-05-24 NOTE — ED Provider Notes (Addendum)
[facility]    CSN: [id number] Arrival date & time: 05/24/23  1503      History   Chief Complaint Chief Complaint  Patient presents with   Laceration    HPI Connie Cook is a 62 y.o. female.    Laceration  Here for a cut on her left forearm.  Yesterday about 30 hours ago and 11 AM, the patient was opening a package and cut her left forearm with a utility knife.  She comes in today for evaluation.  Last Tdap was in 2016 according to the chart  Penicillins cause a rash   Past Medical History:  Diagnosis Date   Anxiety    Arthritis    DDD (degenerative disc disease), cervical    Depression    H/O adenoidectomy    History of removal of ovarian cyst    Hyperlipidemia     Patient Active Problem List   Diagnosis Date Noted   MDD (major depressive disorder), recurrent episode, moderate (HCC) 11/23/2022   Pure hypercholesterolemia 06/26/2022   Prediabetes 06/26/2022   Tendinopathy of left rotator cuff 06/25/2022   Weight gain 04/29/2022   Mild depression 09/24/2021   PTSD (post-traumatic stress disorder) 01/06/2021   Generalized anxiety disorder 01/06/2021   Major depressive disorder, recurrent episode, severe (HCC) 11/22/2020    Past Surgical History:  Procedure Laterality Date   APPENDECTOMY      OB History   No obstetric history on file.      Home Medications    Prior to Admission medications   Medication Sig Start Date End Date Taking? Authorizing Provider  ARIPiprazole (ABILIFY) 20 MG tablet Take 1 tablet (20 mg total) by mouth once daily. 03/01/23  Yes Pashayan, Mardelle Matte, MD  atorvastatin (LIPITOR) 80 MG tablet Take 1 tablet (80 mg total) by mouth daily. 04/01/23  Yes Hoy Register, MD  gabapentin (NEURONTIN) 300 MG capsule Take 1 capsule (300 mg total) by mouth 3 (three) times daily. 03/01/23  Yes Pashayan, Mardelle Matte, MD  LORazepam (ATIVAN) 1 MG tablet Take 1 mg by mouth daily.   Yes [provider]  meloxicam  (MOBIC) 7.5 MG tablet Take 1 tablet (7.5 mg total) by mouth daily. 06/25/22  Yes Hoy Register, MD  mupirocin ointment (BACTROBAN) 2 % Apply 1 Application topically 2 (two) times daily. To affected area till better 05/24/23  Yes Arlin Savona, Janace Aris, MD  sertraline (ZOLOFT) 50 MG tablet Take 3 tablets (150 mg total) by mouth once once daily. 03/01/23  Yes Pashayan, Mardelle Matte, MD  solifenacin (VESICARE) 5 MG tablet Take 1 tablet (5 mg total) by mouth daily. 03/30/23  Yes Hoy Register, MD  albuterol (VENTOLIN HFA) 108 (90 Base) MCG/ACT inhaler Inhale 2 puffs into the lungs every 4 (four) hours as needed for wheezing or shortness of breath. 09/30/22   Hoy Register, MD  hydrOXYzine (ATARAX) 25 MG tablet Take 1 tablet (25 mg total) by mouth 3 (three) times daily as needed. 03/01/23   Lauro [place], MD  methocarbamol (ROBAXIN-750) 750 MG tablet Take 1 tablet (750 mg total) by mouth every 8 (eight) hours as needed for muscle spasms. 06/25/22   Hoy Register, MD  nystatin cream (MYCOSTATIN) Apply 1 Application topically 2 (two) times daily. 03/30/23   Hoy Register, MD  Probiotic Product (PROBIOTIC DAILY PO) Take by mouth.    [provider]    Family History Family History  Problem Relation Age of Onset   Depression Mother    Depression Father  Alcohol abuse Father    Alcohol abuse Maternal Uncle    Depression Paternal Uncle    Alcohol abuse Paternal Uncle    Depression Cousin     Social History Social History   Tobacco Use   Smoking status: Former   Smokeless tobacco: Former  Building services engineer Use: Never used  Substance Use Topics   Alcohol use: Never   Drug use: Never     Allergies   Codeine, Penicillins, and Tramadol   Review of Systems Review of Systems   Physical Exam Triage Vital Signs ED Triage Vitals [05/24/23 1613]  Enc Vitals Group     BP 108/78     Pulse Rate (!) 117     Resp      Temp 98.1 F (36.7 C)     Temp Source Oral     SpO2  93 %     Weight      Height      Head Circumference      Peak Flow      Pain Score      Pain Loc      Pain Edu?      Excl. in GC?    No data found.  Updated Vital Signs BP 108/78 (BP Location: Right Arm)   Pulse (!) 117   Temp 98.1 F (36.7 C) (Oral)   SpO2 93%   Visual Acuity Right Eye Distance:   Left Eye Distance:   Bilateral Distance:    Right Eye Near:   Left Eye Near:    Bilateral Near:     Physical Exam Constitutional:      General: She is not in acute distress.    Appearance: She is not ill-appearing, toxic-appearing or diaphoretic.  Skin:    Coloration: Skin is not pale.     Comments: There is a linear laceration about 2 cm long on the volar surface of the left forearm.  The wound bed is clean and dry and not bleeding.  There is no surrounding erythema or induration.  There is a little bit of swelling around the laceration and ecchymosis.  There is no foreign body and distally neurovascular is intact  Neurological:     General: No focal deficit present.     Mental Status: She is alert and oriented to person, place, and time.  Psychiatric:        Behavior: Behavior normal.      UC Treatments / Results  Labs (all labs ordered are listed, but only abnormal results are displayed) Labs Reviewed - No data to display  EKG   Radiology No results found.  Procedures Procedures (including critical care time)  Medications Ordered in UC Medications  Tdap (BOOSTRIX) injection 0.5 mL (has no administration in time range)    Initial Impression / Assessment and Plan / UC Course  I have reviewed the triage vital signs and the nursing notes.  Pertinent labs & imaging results that were available during my care of the patient were reviewed by me and considered in my medical decision making (see chart for details).       I discussed with the patient that we cannot suture her laceration since it is been a good bit over 18 hours since she sustained the  cut.  Wound care is explained and dressing is applied here in the clinic.  Bactroban is sent in to use with her dressing changes.   Tdap is given to boost her immunity to tetanus  Final Clinical Impressions(s) / UC Diagnoses   Final diagnoses:  Laceration of left upper extremity, initial encounter     Discharge Instructions      Put mupirocin ointment on the cut twice daily until improved  You can wash the cut twice daily and then put new antibiotic ointment on it and a dressing.  After the healing is progressing, then he can go to once daily dressing changes  You have been given a Tdap vaccination to boost your tetanus immunity       ED Prescriptions     Medication Sig Dispense Auth. Provider   mupirocin ointment (BACTROBAN) 2 % Apply 1 Application topically 2 (two) times daily. To affected area till better 22 g Marlinda Mike Janace Aris, MD      PDMP not reviewed this encounter.   Zenia Resides, MD 05/24/23 1658    Zenia Resides, MD 05/24/23 [phone]

## 2023-05-25 ENCOUNTER — Encounter (HOSPITAL_COMMUNITY): Payer: Self-pay | Admitting: Student

## 2023-05-25 ENCOUNTER — Other Ambulatory Visit: Payer: Self-pay

## 2023-05-25 ENCOUNTER — Telehealth (INDEPENDENT_AMBULATORY_CARE_PROVIDER_SITE_OTHER): Payer: No Payment, Other | Admitting: Student

## 2023-05-25 DIAGNOSIS — F32A Depression, unspecified: Secondary | ICD-10-CM | POA: Diagnosis not present

## 2023-05-25 DIAGNOSIS — F411 Generalized anxiety disorder: Secondary | ICD-10-CM

## 2023-05-25 MED ORDER — HYDROXYZINE HCL 25 MG PO TABS
25.0000 mg | ORAL_TABLET | Freq: Three times a day (TID) | ORAL | 3 refills | Status: DC | PRN
Start: 2023-05-25 — End: 2024-07-31
  Filled 2023-05-25: qty 90, 30d supply, fill #0

## 2023-05-25 MED ORDER — ARIPIPRAZOLE 20 MG PO TABS
20.0000 mg | ORAL_TABLET | Freq: Every day | ORAL | 3 refills | Status: DC
Start: 2023-05-25 — End: 2023-11-15
  Filled 2023-05-25: qty 30, 30d supply, fill #0

## 2023-05-25 MED ORDER — SERTRALINE HCL 50 MG PO TABS
150.0000 mg | ORAL_TABLET | Freq: Every day | ORAL | 3 refills | Status: DC
Start: 2023-05-25 — End: 2023-11-15
  Filled 2023-05-25 – 2023-06-19 (×2): qty 90, 30d supply, fill #0

## 2023-05-25 MED ORDER — GABAPENTIN 300 MG PO CAPS
300.0000 mg | ORAL_CAPSULE | Freq: Three times a day (TID) | ORAL | 3 refills | Status: DC
Start: 2023-05-25 — End: 2024-09-26
  Filled 2023-05-25: qty 90, 30d supply, fill #0

## 2023-05-26 NOTE — Addendum Note (Signed)
Addended by: Theodoro Kos A on: 05/26/2023 02:51 PM   Modules accepted: Level of Service

## 2023-05-27 ENCOUNTER — Other Ambulatory Visit: Payer: Self-pay

## 2023-05-28 ENCOUNTER — Ambulatory Visit: Payer: Self-pay

## 2023-05-31 ENCOUNTER — Ambulatory Visit (INDEPENDENT_AMBULATORY_CARE_PROVIDER_SITE_OTHER): Payer: No Payment, Other | Admitting: Licensed Clinical Social Worker

## 2023-05-31 ENCOUNTER — Encounter (HOSPITAL_COMMUNITY): Payer: Self-pay

## 2023-05-31 DIAGNOSIS — F411 Generalized anxiety disorder: Secondary | ICD-10-CM | POA: Diagnosis not present

## 2023-05-31 NOTE — Progress Notes (Signed)
Virtual Visit via Video Note  I connected with Connie Cook on 05/31/23 at 10:00 AM EDT by a video enabled telemedicine application and verified that I am speaking with the correct person using two identifiers.  Location: Patient: pt's home in [place], [place] Provider: clinical office in [place], [place]   I discussed the limitations of evaluation and management by telemedicine and the availability of in person appointments. The patient expressed understanding and agreed to proceed.   I discussed the assessment and treatment plan with the patient. The patient was provided an opportunity to ask questions and all were answered. The patient agreed with the plan and demonstrated an understanding of the instructions.   The patient was advised to call back or seek an in-person evaluation if the symptoms worsen or if the condition fails to improve as anticipated.  I provided 51 minutes of non-face-to-face time during this encounter.   Wyvonnia Lora, LCSW    THERAPIST PROGRESS NOTE  Session Time: 51 minutes  Participation Level: Active  Behavioral Response: CasualAlertAnxious  Type of Therapy: Individual Therapy  Treatment Goals addressed: anxiety  ProgressTowards Goals: Progressing minimally  Interventions: CBT  Summary: Connie Cook is a 62 y.o. female who presents for f/u with this cln. She arrives on time and maintains appropriate eye contact throughout the session. She reports she has been doing well. She reports she helped with her niece's baby shower yesterday and it was good. She notes that she has not seen cln in a while and cln informs pt she no-showed to her last appt. Pt verbalizes understanding of policy. She reports she did well with her anxiety yesterday and trying not to think about how she was feeling and being in the moment was helpful. She reports her depression has improved. She reports she started taking a probiotic and she believes that is  helping her mental health. She also reports she has been pet sitting for a lot of her friends' dogs. She expresses she is going to try to have more of a schedule and start exercising. She reports she stopped going to bible study. She states writing down things she likes to do would help her start the process of creating a schedule. She states her self-esteem and self-love are the goals she would like to continue to work on in therapy. She states she thinks she has made some progress regarding improving her self-esteem, but her body image still needs to be improved. She states she lost the self-love workbook that she bought and she believes it's at a friend's house, who is currently out of town. She states she was enjoying it. "It's not easy. It's a good guideline." She is unable to recall anything specific from the workbook at this time. She reports she continues to struggle with accepting herself as she is. She states it lets her stop her from doing things she enjoys, such as swimming with friends. Pt initially discusses what one should do and minimizes her feelings. When invited by cln, she processes feelings related to her body image that have been ingrained throughout her life. She explores the messages she receives and states she believes if she were engaging in healthy habits, she would feel good enough. She expresses interest in seeing a dietician, and cln recommends she call [facility] in [place], as she states she has Medicare. She is receptive to feedback.   Suicidal/Homicidal: Nowithout intent/plan  Therapist Response: Cln assessed for current stressors, symptoms, and safety since  last session. Cln utilized active listening and validation to help pt feel heard and understood, express thoughts and feelings, and gain insight into their experiences. Cln shared the clinic's no-show/late cancellation policy. Cln requested pt share her true thoughts and feelings, rather than what  she believes are the "right answers." Cln self-disclosed similar experiences with misogynistic messages regarding weight. Cln introduced the idea of pt being enough as she is, whether or not she has healthy habits. Cln requested pt write "I am enough" on a piece of paper and stick it to her bathroom mirror, as well as buy a bathing suit this week and wear it around her house. Cln suggested pt challenge negative messages regarding weight. Cln recommended a store that sells plus size clothing. Cln created new tx plan and scheduled follow-up appointment and confirmed pt's availability and preferred method of service delivery (virtual).   Plan: Return again in 2 weeks.  Diagnosis: Generalized anxiety disorder  Collaboration of Care: Other none required for this visit  Patient/Guardian was advised Release of Information must be obtained prior to any record release in order to collaborate their care with an outside provider. Patient/Guardian was advised if they have not already done so to contact the registration department to sign all necessary forms in order for [place] to release information regarding their care.   Consent: Patient/Guardian gives verbal consent for treatment and assignment of benefits for services provided during this visit. Patient/Guardian expressed understanding and agreed to proceed.   Wyvonnia Lora, LCSW 05/31/2023

## 2023-06-14 ENCOUNTER — Ambulatory Visit (HOSPITAL_COMMUNITY): Payer: No Payment, Other | Admitting: Licensed Clinical Social Worker

## 2023-06-17 ENCOUNTER — Telehealth (HOSPITAL_COMMUNITY): Payer: Self-pay | Admitting: Licensed Clinical Social Worker

## 2023-06-17 ENCOUNTER — Encounter (INDEPENDENT_AMBULATORY_CARE_PROVIDER_SITE_OTHER): Payer: Self-pay | Admitting: Physician Assistant

## 2023-06-17 NOTE — Telephone Encounter (Signed)
Cln called pt to discuss requirements for her continuing therapy, as she has reached 3 no-shows/late cancellations since March. Cln discussed this with pt at her last appointment and informed her of the policy. Cln informed pt that the clinic policy is that she is required to come in during walk-in hours to be re-established. However, this cln does not have walk-in days and therefore would not be able to see pt at a walk-in. Cln asked pt to come to the office by 8 am during the week and to be scheduled for cln's next available appointment and that future appointments are required to be in-person. Cln informed pt that if she has 3 no-shows/late cancellations within 6 months again, she will be discharged from the clinic for therapy. Pt verbalized understanding. She stated she is scheduled to begin med management at [facility] because this clinic and none of her other providers would refill her Lorazepam. Cln asked if pt would like referrals for other therapists as well and pt declined. Pt stated she doesn't know who referred her to [facility] but that she needs her Lorazepam. Pt repeated instructions for continuing therapy with this cln and demonstrated understanding.

## 2023-06-21 ENCOUNTER — Other Ambulatory Visit: Payer: Self-pay

## 2023-06-21 ENCOUNTER — Encounter (HOSPITAL_COMMUNITY): Payer: Self-pay

## 2023-06-21 ENCOUNTER — Ambulatory Visit (HOSPITAL_COMMUNITY): Payer: No Payment, Other | Admitting: Licensed Clinical Social Worker

## 2023-06-22 ENCOUNTER — Ambulatory Visit
Admission: RE | Admit: 2023-06-22 | Discharge: 2023-06-22 | Disposition: A | Discharge status: Home / Self Care | Payer: Self-pay | Source: Ambulatory Visit | Attending: Family Medicine | Admitting: Family Medicine

## 2023-06-22 DIAGNOSIS — Z1231 Encounter for screening mammogram for malignant neoplasm of breast: Secondary | ICD-10-CM

## 2023-06-23 ENCOUNTER — Other Ambulatory Visit: Payer: Self-pay

## 2023-06-29 ENCOUNTER — Encounter: Payer: Medicare Other | Admitting: Bariatrics

## 2023-07-07 ENCOUNTER — Other Ambulatory Visit: Payer: Self-pay

## 2023-07-07 DIAGNOSIS — F331 Major depressive disorder, recurrent, moderate: Secondary | ICD-10-CM | POA: Diagnosis not present

## 2023-07-07 DIAGNOSIS — F410 Panic disorder [episodic paroxysmal anxiety] without agoraphobia: Secondary | ICD-10-CM | POA: Diagnosis not present

## 2023-07-07 DIAGNOSIS — F411 Generalized anxiety disorder: Secondary | ICD-10-CM | POA: Diagnosis not present

## 2023-07-07 DIAGNOSIS — F4312 Post-traumatic stress disorder, chronic: Secondary | ICD-10-CM | POA: Diagnosis not present

## 2023-07-07 MED ORDER — SERTRALINE HCL 100 MG PO TABS
200.0000 mg | ORAL_TABLET | ORAL | 0 refills | Status: DC
  Filled 2023-07-07 – 2023-07-29 (×2): qty 60, 30d supply, fill #0

## 2023-07-07 MED ORDER — ARIPIPRAZOLE 20 MG PO TABS
20.0000 mg | ORAL_TABLET | Freq: Every evening | ORAL | 0 refills | Status: DC
  Filled 2023-07-07: qty 30, 30d supply, fill #0

## 2023-07-14 ENCOUNTER — Other Ambulatory Visit: Payer: Self-pay

## 2023-07-22 ENCOUNTER — Encounter: Payer: Medicare Other | Admitting: Nurse Practitioner

## 2023-07-29 ENCOUNTER — Other Ambulatory Visit: Payer: Self-pay

## 2023-07-30 ENCOUNTER — Other Ambulatory Visit: Payer: Self-pay

## 2023-08-03 ENCOUNTER — Other Ambulatory Visit: Payer: Self-pay

## 2023-08-03 DIAGNOSIS — F411 Generalized anxiety disorder: Secondary | ICD-10-CM | POA: Diagnosis not present

## 2023-08-03 DIAGNOSIS — F4312 Post-traumatic stress disorder, chronic: Secondary | ICD-10-CM | POA: Diagnosis not present

## 2023-08-03 DIAGNOSIS — F410 Panic disorder [episodic paroxysmal anxiety] without agoraphobia: Secondary | ICD-10-CM | POA: Diagnosis not present

## 2023-08-03 DIAGNOSIS — F331 Major depressive disorder, recurrent, moderate: Secondary | ICD-10-CM | POA: Diagnosis not present

## 2023-08-03 MED ORDER — ARIPIPRAZOLE 20 MG PO TABS
20.0000 mg | ORAL_TABLET | Freq: Every day | ORAL | 1 refills | Status: DC
  Filled 2023-08-03: qty 30, 30d supply, fill #0

## 2023-08-03 MED ORDER — SERTRALINE HCL 100 MG PO TABS: 200.0000 mg | ORAL_TABLET | ORAL | 1 refills | Status: DC

## 2023-08-03 MED ORDER — MAGNESIUM 100 MG PO TABS: 200.0000 mg | ORAL_TABLET | Freq: Every day | ORAL | 1 refills | Status: DC

## 2023-08-09 ENCOUNTER — Other Ambulatory Visit: Payer: Self-pay

## 2023-08-10 ENCOUNTER — Telehealth (HOSPITAL_COMMUNITY): Payer: No Payment, Other | Admitting: Student

## 2023-08-12 ENCOUNTER — Encounter: Payer: Self-pay | Admitting: Family Medicine

## 2023-08-18 ENCOUNTER — Other Ambulatory Visit: Payer: Self-pay

## 2023-09-28 ENCOUNTER — Other Ambulatory Visit: Payer: Self-pay

## 2023-09-28 DIAGNOSIS — F331 Major depressive disorder, recurrent, moderate: Secondary | ICD-10-CM | POA: Diagnosis not present

## 2023-09-28 DIAGNOSIS — F4312 Post-traumatic stress disorder, chronic: Secondary | ICD-10-CM | POA: Diagnosis not present

## 2023-09-28 DIAGNOSIS — F411 Generalized anxiety disorder: Secondary | ICD-10-CM | POA: Diagnosis not present

## 2023-09-28 DIAGNOSIS — F410 Panic disorder [episodic paroxysmal anxiety] without agoraphobia: Secondary | ICD-10-CM | POA: Diagnosis not present

## 2023-09-28 MED ORDER — ARIPIPRAZOLE 15 MG PO TABS
15.0000 mg | ORAL_TABLET | Freq: Every day | ORAL | 1 refills | Status: DC
  Filled 2023-09-28: qty 30, 30d supply, fill #0

## 2023-09-28 MED ORDER — SERTRALINE HCL 100 MG PO TABS
200.0000 mg | ORAL_TABLET | ORAL | 1 refills | Status: DC
  Filled 2023-09-28: qty 60, 30d supply, fill #0
  Filled 2024-01-24: qty 60, 30d supply, fill #1

## 2023-09-28 MED ORDER — MAGNESIUM 100 MG PO TABS: 200.0000 mg | ORAL_TABLET | Freq: Every day | ORAL | 1 refills | Status: DC

## 2023-10-04 ENCOUNTER — Ambulatory Visit: Payer: Medicare Other | Attending: Family Medicine | Admitting: Family Medicine

## 2023-10-04 ENCOUNTER — Other Ambulatory Visit: Payer: Self-pay

## 2023-10-04 ENCOUNTER — Other Ambulatory Visit (HOSPITAL_COMMUNITY): Payer: Self-pay

## 2023-10-04 ENCOUNTER — Ambulatory Visit: Payer: Self-pay | Admitting: Family Medicine

## 2023-10-04 ENCOUNTER — Encounter: Payer: Self-pay | Admitting: Family Medicine

## 2023-10-04 VITALS — BP 127/80 | HR 76 | Ht 60.0 in | Wt 230.6 lb

## 2023-10-04 DIAGNOSIS — Z23 Encounter for immunization: Secondary | ICD-10-CM | POA: Diagnosis not present

## 2023-10-04 DIAGNOSIS — J302 Other seasonal allergic rhinitis: Secondary | ICD-10-CM | POA: Diagnosis not present

## 2023-10-04 DIAGNOSIS — R413 Other amnesia: Secondary | ICD-10-CM

## 2023-10-04 DIAGNOSIS — B372 Candidiasis of skin and nail: Secondary | ICD-10-CM

## 2023-10-04 DIAGNOSIS — R7303 Prediabetes: Secondary | ICD-10-CM | POA: Diagnosis not present

## 2023-10-04 MED ORDER — NYSTATIN 100000 UNIT/GM EX CREA
1.0000 | TOPICAL_CREAM | Freq: Two times a day (BID) | CUTANEOUS | 2 refills | Status: DC
  Filled 2023-10-04 (×2): qty 30, 15d supply, fill #0
  Filled 2024-03-10: qty 30, 15d supply, fill #1
  Filled 2024-07-04 (×2): qty 30, 15d supply, fill #2

## 2023-10-04 MED ORDER — ALBUTEROL SULFATE HFA 108 (90 BASE) MCG/ACT IN AERS
2.0000 | INHALATION_SPRAY | RESPIRATORY_TRACT | 3 refills | Status: AC | PRN
  Filled 2023-10-04: qty 6.7, 25d supply, fill #0
  Filled 2023-10-04: qty 6.7, 17d supply, fill #0
  Filled 2024-03-03 – 2024-03-10 (×2): qty 6.7, 17d supply, fill #1

## 2023-10-04 NOTE — Patient Instructions (Signed)
VISIT SUMMARY:  During today's visit, we discussed several health concerns including your persistent cough, weight gain, prediabetes, short-term memory loss, and a chronic rash in the scar area from previous surgery. We have outlined a plan to address each of these issues and will follow up with you in one month to review your progress and lab results.  YOUR PLAN:  -ALLERGIC RHINITIS: Allergic rhinitis is an allergic reaction that causes sneezing, congestion, and a runny nose. Since your cough and phlegm persist despite using over-the-counter antihistamines, we will refer you to an allergist for further evaluation and management.  -OBESITY: Obesity is a condition characterized by excessive body fat. Given your weight gain to 230 lbs despite dietary efforts, we will refer you to a weight management clinic for a comprehensive evaluation and management. We may also consider restarting Topamax for weight loss if deemed appropriate after your evaluation.  -PREDIABETES: Prediabetes is a condition where blood sugar levels are higher than normal but not yet high enough to be diagnosed as diabetes. We will monitor your A1c levels to check for progression to diabetes and consider weight loss medications if necessary.  -SHORT-TERM MEMORY LOSS: Short-term memory loss involves difficulty in remembering recent events. We will order lab tests to evaluate potential causes and may refer you to a neurologist and consider a CT scan based on the lab results.  -CHRONIC RASH IN SCAR AREA: The chronic rash in your scar area is likely due to the weeping and irritation from the previous pulmonary cyst surgery. We will refer you to a dermatologist for further evaluation and management.  -GENERAL HEALTH MAINTENANCE: We will refill your Albuterol inhaler as needed. Please schedule a follow-up appointment in one month to discuss the results of your lab work and the next steps for your memory loss  workup.  INSTRUCTIONS:  Please schedule a follow-up appointment in one month to discuss the results of your lab work and the next steps for your memory loss workup.

## 2023-10-04 NOTE — Progress Notes (Unsigned)
Subjective:  Patient ID: Connie Cook, female    DOB: December 25, 1960  Age: 62 y.o. MRN: [id number]  CC: Medical Management of Chronic Issues (Referral to allergy/Discuss weight loss/Scar on back)   HPI Connie Cook is a 62 y.o. 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  History of Present Illness            Past Medical History:  Diagnosis Date   Anxiety    Arthritis    DDD (degenerative disc disease), cervical    Depression    H/O adenoidectomy    History of removal of ovarian cyst    Hyperlipidemia     Past Surgical History:  Procedure Laterality Date   APPENDECTOMY      Family History  Problem Relation Age of Onset   Depression Mother    Depression Father    Alcohol abuse Father    Alcohol abuse Maternal Uncle    Depression Paternal Uncle    Alcohol abuse Paternal Uncle    Depression Cousin     Social History   Socioeconomic History   Marital status: Single    Spouse name: Not on file   Number of children: 0   Years of education: Not on file   Highest education level: Some college, no degree  Occupational History   Not on file  Tobacco Use   Smoking status: Former   Smokeless tobacco: Former  Building services engineer status: Never Used  Substance and Sexual Activity   Alcohol use: Never   Drug use: Never   Sexual activity: Not on file  Other Topics Concern   Not on file  Social History Narrative   Not on file   Social Determinants of Health   Financial Resource Strain: Not on file  Food Insecurity: Food Insecurity Present (07/10/2021)   Received from [facility], [facility]   Hunger Vital Sign    Worried About Running Out of Food in the Last Year: Often true    Ran  Out of Food in the Last Year: Often true  Transportation Needs: Not on file  Physical Activity: Not on file  Stress: Not on file  Social Connections: Unknown (03/30/2022)   Received from [facility], [facility]   Social Network    Social Network: Not on file    Allergies  Allergen Reactions   Codeine Shortness Of Breath   Penicillins Rash   Prednisone Other (See Comments)    Has tolerated Depomedrol in past.   Tramadol Itching    Outpatient Medications Prior to Visit  Medication Sig Dispense Refill   albuterol (VENTOLIN HFA) 108 (90 Base) MCG/ACT inhaler Inhale 2 puffs into the lungs every 4 (four) hours as needed for wheezing or shortness of breath. 6.7 g 3   ARIPiprazole (ABILIFY) 15 MG tablet Take 1 tablet (15 mg total) by mouth at bedtime. 30 tablet 1   ARIPiprazole (ABILIFY) 20 MG tablet Take 1 tablet (20 mg total) by mouth once daily. 30 tablet 3   ARIPiprazole (ABILIFY) 20 MG tablet Take 1 tablet (20 mg total) by mouth at bedtime. 30 tablet 0   ARIPiprazole (ABILIFY) 20 MG tablet Take 1 tablet (20 mg total) by mouth at bedtime. 30 tablet 1   atorvastatin (  LIPITOR) 80 MG tablet Take 1 tablet (80 mg total) by mouth daily. 90 tablet 1   gabapentin (NEURONTIN) 300 MG capsule Take 1 capsule (300 mg total) by mouth 3 (three) times daily. 90 capsule 3   hydrOXYzine (ATARAX) 25 MG tablet Take 1 tablet (25 mg total) by mouth 3 (three) times daily as needed. 90 tablet 3   LORazepam (ATIVAN) 1 MG tablet Take 1 mg by mouth daily.     Magnesium 100 MG TABS Take 2-3 tablets (200-300 mg total) by mouth at bedtime. 90 tablet 1   Magnesium 100 MG TABS Take 2-3 tablets (200-300 mg total) by mouth at bedtime. 90 tablet 1   meloxicam (MOBIC) 7.5 MG tablet Take 1 tablet (7.5 mg total) by mouth daily. 30 tablet 1   methocarbamol (ROBAXIN-750) 750 MG tablet Take 1 tablet (750 mg total) by mouth every 8 (eight) hours as needed for muscle spasms. 90 tablet 1   mupirocin ointment (BACTROBAN) 2 %  Apply 1 Application topically 2 (two) times daily. To affected area till better 22 g 0   nystatin cream (MYCOSTATIN) Apply 1 Application topically 2 (two) times daily. 30 g 2   Probiotic Product (PROBIOTIC DAILY PO) Take by mouth.     sertraline (ZOLOFT) 100 MG tablet Take 2 tablets (200 mg total) by mouth every morning. 60 tablet 1   sertraline (ZOLOFT) 100 MG tablet Take 2 tablets (200 mg total) by mouth every morning after meals 60 tablet 1   sertraline (ZOLOFT) 50 MG tablet Take 3 tablets (150 mg total) by mouth once once daily. 90 tablet 3   solifenacin (VESICARE) 5 MG tablet Take 1 tablet (5 mg total) by mouth daily. 90 tablet 1   No facility-administered medications prior to visit.     ROS Review of Systems *** Objective:  BP 127/80   Pulse 76   Ht 5' (1.524 m)   Wt 230 lb 9.6 oz (104.6 kg)   SpO2 96%   BMI 45.04 kg/m      10/04/2023    1:57 PM 05/24/2023    4:13 PM 03/30/2023    2:52 PM  BP/Weight  Systolic BP 127 108 111  Diastolic BP 80 78 75  Wt. (Lbs) 230.6  198.6  BMI 45.04 kg/m2  38.79 kg/m2      Physical Exam Skin:    Comments: Erythematous rash in between gluteal fold which is extensive with associated weeping and superior aspect of gluteal cleft    ***    Latest Ref Rng & Units 03/31/2023    1:56 PM 09/30/2022   11:33 AM 06/25/2022    9:45 AM  CMP  Glucose 70 - 99 mg/dL 95  97  94   BUN 8 - 27 mg/dL 21  10  11    Creatinine 0.57 - 1.00 mg/dL 0.27  2.53  6.64   Sodium 134 - 144 mmol/L 140  140  144   Potassium 3.5 - 5.2 mmol/L 4.7  4.3  4.0   Chloride 96 - 106 mmol/L 102  101  103   CO2 20 - 29 mmol/L 23  26  23    Calcium 8.7 - 10.3 mg/dL 9.6  9.7  9.2   Total Protein 6.0 - 8.5 g/dL 7.2  7.2  7.6   Total Bilirubin 0.0 - 1.2 mg/dL 0.6  0.4  0.4   Alkaline Phos 44 - 121 IU/L 118  91  91   AST 0 - 40 IU/L 23  22  27   ALT 0 - 32 IU/L 14  14  23      Lipid Panel     Component Value Date/Time   CHOL 245 (H) 03/31/2023 1356   TRIG 100 03/31/2023  1356   HDL 51 03/31/2023 1356   LDLCALC 176 (H) 03/31/2023 1356    CBC    Component Value Date/Time   WBC 8.1 03/31/2023 1356   RBC 4.72 03/31/2023 1356   HGB 13.3 03/31/2023 1356   HCT 40.0 03/31/2023 1356   PLT 354 03/31/2023 1356   MCV 85 03/31/2023 1356   MCH 28.2 03/31/2023 1356   MCHC 33.3 03/31/2023 1356   RDW 11.9 03/31/2023 1356   LYMPHSABS 1.4 03/31/2023 1356   EOSABS 0.1 03/31/2023 1356   BASOSABS 0.1 03/31/2023 1356    Lab Results  Component Value Date   HGBA1C 6.0 (H) 03/31/2023      Assessment & Plan:  Assessment and Plan               No orders of the defined types were placed in this encounter.   Follow-up: No follow-ups on file.       Hoy Register, MD, FAAFP. [facility] [place], [place] [phone]   10/04/2023, 1:59 PM

## 2023-10-05 ENCOUNTER — Encounter: Payer: Self-pay | Admitting: Family Medicine

## 2023-10-18 LAB — ATN PROFILE
A -- Beta-amyloid 42/40 Ratio: 0.104 (ref >0.102)
Beta-amyloid 40: 106.75 pg/mL
Beta-amyloid 42: 11.05 pg/mL
N -- NfL, Plasma: 1.74 pg/mL (ref 0.00–4.61)
T -- p-tau181: 0.82 pg/mL (ref 0.00–0.97)

## 2023-10-18 LAB — VITAMIN B12: Vitamin B-12: 214 pg/mL — ABNORMAL LOW (ref 232–1245)

## 2023-10-18 LAB — TREPONEMAL ANTIBODIES, TPPA: Treponemal Antibodies, TPPA: NONREACTIVE

## 2023-10-18 LAB — HEMOGLOBIN A1C
Est. average glucose Bld gHb Est-mCnc: 148 mg/dL
Hgb A1c MFr Bld: 6.8 % — ABNORMAL HIGH (ref 4.8–5.6)

## 2023-10-18 LAB — THYROID PANEL WITH TSH
Free Thyroxine Index: 2.1 (ref 1.2–4.9)
T3 Uptake Ratio: 25 % (ref 24–39)
T4, Total: 8.2 ug/dL (ref 4.5–12.0)
TSH: 3.36 u[IU]/mL (ref 0.450–4.500)

## 2023-10-18 LAB — APOE ALZHEIMER'S RISK

## 2023-10-18 LAB — RPR W/REFLEX TO TREPSURE: RPR: NONREACTIVE

## 2023-10-18 LAB — VITAMIN B6: Vitamin B6: 9.6 ug/L (ref 3.4–65.2)

## 2023-10-19 ENCOUNTER — Other Ambulatory Visit: Payer: Self-pay | Admitting: Family Medicine

## 2023-10-19 DIAGNOSIS — R413 Other amnesia: Secondary | ICD-10-CM

## 2023-10-25 DIAGNOSIS — L0101 Non-bullous impetigo: Secondary | ICD-10-CM | POA: Diagnosis not present

## 2023-10-25 DIAGNOSIS — L309 Dermatitis, unspecified: Secondary | ICD-10-CM | POA: Diagnosis not present

## 2023-10-26 ENCOUNTER — Encounter: Payer: Self-pay | Admitting: Physician Assistant

## 2023-10-26 ENCOUNTER — Ambulatory Visit: Payer: Medicare Other | Admitting: Physician Assistant

## 2023-10-26 ENCOUNTER — Ambulatory Visit (INDEPENDENT_AMBULATORY_CARE_PROVIDER_SITE_OTHER): Payer: Medicare Other

## 2023-10-26 ENCOUNTER — Ambulatory Visit (INDEPENDENT_AMBULATORY_CARE_PROVIDER_SITE_OTHER): Payer: Self-pay

## 2023-10-26 ENCOUNTER — Ambulatory Visit (INDEPENDENT_AMBULATORY_CARE_PROVIDER_SITE_OTHER): Payer: Medicare Other | Admitting: Sports Medicine

## 2023-10-26 ENCOUNTER — Other Ambulatory Visit: Payer: Self-pay

## 2023-10-26 ENCOUNTER — Encounter: Payer: Self-pay | Admitting: Sports Medicine

## 2023-10-26 DIAGNOSIS — G8929 Other chronic pain: Secondary | ICD-10-CM

## 2023-10-26 DIAGNOSIS — M7521 Bicipital tendinitis, right shoulder: Secondary | ICD-10-CM

## 2023-10-26 DIAGNOSIS — M25511 Pain in right shoulder: Secondary | ICD-10-CM | POA: Diagnosis not present

## 2023-10-26 DIAGNOSIS — M545 Low back pain, unspecified: Secondary | ICD-10-CM

## 2023-10-26 MED ORDER — METHYLPREDNISOLONE ACETATE 40 MG/ML IJ SUSP
40.0000 mg | INTRAMUSCULAR | Status: AC | PRN
  Administered 2023-10-26: 40 mg via INTRA_ARTICULAR

## 2023-10-26 MED ORDER — METHOCARBAMOL 750 MG PO TABS: 750.0000 mg | ORAL_TABLET | Freq: Two times a day (BID) | ORAL | 2 refills | Status: DC | PRN

## 2023-10-26 MED ORDER — BUPIVACAINE HCL 0.25 % IJ SOLN
2.0000 mL | INTRAMUSCULAR | Status: AC | PRN
  Administered 2023-10-26: 2 mL via INTRA_ARTICULAR

## 2023-10-26 MED ORDER — LIDOCAINE HCL 1 % IJ SOLN
2.0000 mL | INTRAMUSCULAR | Status: AC | PRN
  Administered 2023-10-26: 2 mL

## 2023-10-26 MED ORDER — METHYLPREDNISOLONE 4 MG PO TBPK: ORAL_TABLET | ORAL | 0 refills | Status: DC

## 2023-10-26 NOTE — Progress Notes (Signed)
   Procedure Note  Patient: Connie Cook             Date of Birth: 1961/05/05           MRN: [id number]             Visit Date: 10/26/2023  Procedures: Visit Diagnoses:  1. Chronic right shoulder pain   2. Biceps tendinitis, right    Large Joint Inj: R glenohumeral on 10/26/2023 11:17 AM Indications: pain Details: 22 G 3.5 in needle, ultrasound-guided posterior approach Medications: 2 mL lidocaine 1 %; 2 mL bupivacaine 0.25 %; 40 mg methylPREDNISolone acetate 40 MG/ML Outcome: tolerated well, no immediate complications  US-guided glenohumeral joint injection, right shoulder After discussion on risks/benefits/indications, informed verbal consent was obtained. A timeout was then performed. The patient was positioned lying lateral recumbent on examination table. The patient's shoulder was prepped with betadine and multiple alcohol swabs and utilizing ultrasound guidance, the patient's glenohumeral joint was identified on ultrasound. Using ultrasound guidance a 22-gauge, 3.5 inch needle with a mixture of 2:2:1 cc's lidocaine:bupivicaine:depomedrol was directed from a lateral to medial direction via in-plane technique into the glenohumeral joint with visualization of appropriate spread of injectate into the joint. Patient tolerated the procedure well without immediate complications.      Procedure, treatment alternatives, risks and benefits explained, specific risks discussed. Consent was given by the patient. Immediately prior to procedure a time out was called to verify the correct patient, procedure, equipment, support staff and site/side marked as required. Patient was prepped and draped in the usual sterile fashion.     - follow-up with Dr. Abbott Pao as indicated; I am happy to see them as needed  Madelyn Brunner, DO Primary Care Sports Medicine Physician  [facility] - Orthopedics  This note was dictated using Dragon naturally speaking software and may  contain errors in syntax, spelling, or content which have not been identified prior to signing this note.

## 2023-10-26 NOTE — Progress Notes (Signed)
Office Visit Note   Patient: Connie Cook           Date of Birth: 1960-12-03           MRN: [id number] Visit Date: 10/26/2023              Requested by: Hoy Register, MD [address] [place],  [place] [address] PCP: Hoy Register, MD   Assessment & Plan: Visit Diagnoses:  1. Chronic right shoulder pain   2. Low back pain, unspecified back pain laterality, unspecified chronicity, unspecified whether sciatica present     Plan: Impression is right shoulder biceps tendinitis and chronic low back pain.  Regards to the shoulder, I have referred her to Dr. Shon Baton for intra-articular cortisone injection.  In regards to the back, we discussed starting her on a Medrol Dosepak as well as muscle relaxers and sending her to physical therapy.  If her symptoms do not improve, she will let Connie Cook know and we we will order an updated MRI of the lumbar spine.  Otherwise, follow-up as needed.  Follow-Up Instructions: Return if symptoms worsen or fail to improve.   Orders:  Orders Placed This Encounter  Procedures   XR Shoulder Right   XR Lumbar Spine 2-3 Views   Ambulatory referral to Physical Therapy   Meds ordered this encounter  Medications   methylPREDNISolone (MEDROL DOSEPAK) 4 MG TBPK tablet    Sig: Take as directed    Dispense:  21 tablet    Refill:  0   methocarbamol (ROBAXIN-750) 750 MG tablet    Sig: Take 1 tablet (750 mg total) by mouth 2 (two) times daily as needed for muscle spasms.    Dispense:  20 tablet    Refill:  2      Procedures: No procedures performed   Clinical Data: No additional findings.   Subjective: Chief Complaint  Patient presents with   Right Shoulder - Pain   Lower Back - Pain    HPI patient is a pleasant 62 year old female who comes in today with right shoulder and low back pain.  In regards to her low back, symptoms started about a month ago when she was lifting a box and rotated.  She has had pain to the middle and  right lower back since.  Symptoms are worse when she is standing right as well as with lumbar flexion.  She denies any weakness or paresthesias to either lower extremity.  No bowel or bladder incontinence or saddle paresthesias.  She has a history of degenerative disc disease and has undergone injections in the past by Dr. Horald Chestnut.  She tells me these injections were probably around 10 years ago.  In regards to the right shoulder, symptoms began soon after injuring her back as she was having to rely on her arms to push herself out of her bed.  The pain she is having is to the anterior shoulder.  Worse using her arms to push herself out of her bed.  She has been taking Advil without significant relief.  No previous injection to the right shoulder.  Review of Systems as detailed in HPI.  All others reviewed and are negative.   Objective: Vital Signs: There were no vitals taken for this visit.  Physical Exam well-developed well-nourished female in no acute distress.  Alert and oriented x 3.  Ortho Exam right shoulder exam: Forward flexion to about 130 degrees.  Abduction to 90 degrees.  Pain with speeds and O'Brien's  testing.  No pain with empty can testing.  Full strength throughout.  She is neurovascular intact distally.  Lumbar spine exam: No spinous tenderness.  She does have pain in the paraspinous musculature on the right.  Increased pain with lumbar extension and flexion.  Negative straight leg raise.  No focal weakness.  She is neurovascular tact distally.  Specialty Comments:  No specialty comments available.  Imaging: XR Shoulder Right  Result Date: 10/26/2023 Moderate degenerative changes to the [facility] joint.  Slight pseudosubluxation of the humeral head.  XR Lumbar Spine 2-3 Views  Result Date: 10/26/2023 Advanced multilevel degenerative changes.    PMFS History: Patient Active Problem List   Diagnosis Date Noted   MDD (major depressive disorder), recurrent episode, moderate (HCC)  11/23/2022   Pure hypercholesterolemia 06/26/2022   Prediabetes 06/26/2022   Tendinopathy of left rotator cuff 06/25/2022   Weight gain 04/29/2022   Mild depression 09/24/2021   PTSD (post-traumatic stress disorder) 01/06/2021   Generalized anxiety disorder 01/06/2021   Major depressive disorder, recurrent episode, severe (HCC) 11/22/2020   Past Medical History:  Diagnosis Date   Anxiety    Arthritis    DDD (degenerative disc disease), cervical    Depression    H/O adenoidectomy    History of removal of ovarian cyst    Hyperlipidemia     Family History  Problem Relation Age of Onset   Depression Mother    Depression Father    Alcohol abuse Father    Alcohol abuse Maternal Uncle    Depression Paternal Uncle    Alcohol abuse Paternal Uncle    Depression Cousin     Past Surgical History:  Procedure Laterality Date   APPENDECTOMY     Social History   Occupational History   Not on file  Tobacco Use   Smoking status: Former   Smokeless tobacco: Former  Building services engineer status: Never Used  Substance and Sexual Activity   Alcohol use: Never   Drug use: Never   Sexual activity: Not on file

## 2023-10-29 ENCOUNTER — Telehealth: Payer: Self-pay

## 2023-10-29 ENCOUNTER — Telehealth: Payer: Self-pay | Admitting: Family Medicine

## 2023-10-29 DIAGNOSIS — K080 Exfoliation of teeth due to systemic causes: Secondary | ICD-10-CM | POA: Diagnosis not present

## 2023-10-29 NOTE — Telephone Encounter (Signed)
Courtney from [employer] has called about a referral that was received for patient for genetic testing and per Toni Amend, Dr Italy Haldeman-Englert is requesting blood test results that the genetic counselor done and this needs to be faxed to # [phone].  [facility] callback # [phone] OR [phone] (Courtney's callback)

## 2023-10-29 NOTE — Telephone Encounter (Signed)
Copied from CRM [id number]. Topic: Referral - Request for Referral >> Oct 28, 2023  1:39 PM Phill Myron wrote: Pt Connie Cook is requesting a referral to [facility] rheumatology on [address]

## 2023-10-29 NOTE — Telephone Encounter (Signed)
Spoke with Toni Amend from [employer]  advise that this issue has been taken care of

## 2023-10-29 NOTE — Telephone Encounter (Signed)
Call placed to patient and VM was left informing patient to return call to get more information on the need for the referral.

## 2023-11-02 ENCOUNTER — Encounter: Payer: Medicare Other | Admitting: Bariatrics

## 2023-11-03 DIAGNOSIS — K080 Exfoliation of teeth due to systemic causes: Secondary | ICD-10-CM | POA: Diagnosis not present

## 2023-11-14 NOTE — Progress Notes (Unsigned)
New Patient Note  RE: Connie Cook MRN: [id number] DOB: October 23, 1961 Date of Office Visit: 11/15/2023  Consult requested by: Hoy Register, MD Primary care provider: Hoy Register, MD  Chief Complaint: Cough, Allergies, and Wheezing  History of Present Illness: I had the pleasure of seeing Connie Cook for initial evaluation at the [facility] on 11/15/2023. She is a 62 y.o. female, who is referred here by Hoy Register, MD for the evaluation of chronic rhinitis.  Discussed the use of AI scribe software for clinical note transcription with the patient, who gave verbal consent to proceed.  The patient, with a history of severe sleep apnea, psoriatic arthritis, and significant secondhand smoke exposure, presents with chronic respiratory symptoms. She reports a persistent wheezing and coughing, often to the point of vomiting, particularly when outside. This has been ongoing since approximately 2016. The patient denies shortness of breath or chest tightness but admits to occasional nocturnal awakenings due to breathing difficulties.  The patient's respiratory symptoms do not appear to respond to her current albuterol inhaler, and she has not tried a steroid inhaler in the past. She also has a nebulizer machine at home, which she uses primarily during bouts of bronchitis or colds, and it seems to provide some relief.  The patient's symptoms seem to worsen when outside, particularly in cold weather, and she describes her symptoms as occurring in 'spells.' She has not required emergency or urgent care for her breathing problems this year, nor has she needed to see her primary care provider for this issue.  In addition to her respiratory symptoms, the patient also reports symptoms suggestive of postnasal drip and stuffy nose, particularly during the winter months. These symptoms have been ongoing since around 2016-2017. She occasionally uses Zyrtec or loratadine, which  provides some relief, and Flonase nasal spray during colds or illnesses.  The patient also mentions a history of psoriatic arthritis, for which she was previously on Humira until 2018. She reports ongoing pain and stiffness despite the medication and expresses a need to reestablish care with her rheumatologist. 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   She lists prednisone as an allergy due to a previous manic episode, which she clarifies as a side effect rather than a true allergy. She has one cat at home, which is primarily an outdoor pet.     In the last month, frequency of symptoms: daily at times. Frequency of SABA use: Less than once per week. Interference with physical activity: sometimes. Sleep is disturbed. In the last 12 months, emergency room visits/urgent care visits/doctor office visits or hospitalizations due to respiratory issues: no. In the last 12 months, oral steroids courses: no. Lifetime history of hospitalization for respiratory issues: in 2006. Prior intubations: no. History of pneumonia: no. She was not evaluated by allergist/pulmonologist in the past. Smoking exposure: second hand tobacco exposure growing up from the parents. Up to date with flu vaccine: yes. Up to date with pneumonia vaccine: unsure. Up to date with COVID-19 vaccine: yes. Prior Covid-19 infection: no. History of reflux: not sure.  No prior allergy testing. No recent CXR. No prior sinus surgery. History of nasal polyps: yes. Last eye exam: a few years ago.  Assessment and Plan: [name] is a 63 y.o. female with: Chronic coughing Wheezing Persistent coughing and wheezing, exacerbated by cold weather and outdoor activities. History of secondhand  smoke exposure. Albuterol inhaler and nebulizer provide minimal relief. No recent chest imaging. Today's spirometry showed: No overt abnormalities noted given today's efforts  with 1% improvement in FEV1 post bronchodilator treatment. Clinically feeling slightly improved.  Get CXR due to prolonged symptoms.  Daily controller medication(s): start Symbicort 2 puffs twice a day with spacer and rinse mouth afterwards. Spacer given and demonstrated proper use with inhaler. Patient understood technique and all questions/concerned were addressed.  During respiratory infections/flares:  Pretreat with albuterol 2 puffs or albuterol nebulizer.  If you need to use your albuterol nebulizer machine back to back within 15-30 minutes with no relief then please go to the ER/urgent care for further evaluation.  May use albuterol rescue inhaler 2 puffs or nebulizer every 4 to 6 hours as needed for shortness of breath, chest tightness, coughing, and wheezing. May use albuterol rescue inhaler 2 puffs 5 to 15 minutes prior to strenuous physical activities. Monitor frequency of use - if you need to use it more than twice per week on a consistent basis let us know.  Get spirometry at next visit.  Other allergic rhinitis Seasonal symptoms of runny nose and postnasal drip. Current use of Zyrtec and Flonase as needed. Return for allergy skin testing. Will make additional recommendations based on results. Use over the counter antihistamines such as Zyrtec (cetirizine), Claritin (loratadine), Allegra (fexofenadine), or Xyzal (levocetirizine) daily as needed. May take twice a day during allergy flares. May switch antihistamines every few months. Use Flonase (fluticasone) nasal spray 1-2 sprays per nostril once a day as needed for nasal congestion.  Nasal saline spray (i.e., Simply Saline) or nasal saline lavage (i.e., NeilMed) is recommended as needed and prior to medicated nasal sprays.  Return for Skin testing.  Meds ordered this encounter  Medications   budesonide-formoterol (SYMBICORT) 80-4.5 MCG/ACT inhaler    Sig: Inhale 2 puffs into the lungs 2 (two) times daily with spacer and  rinse mouth afterwards.    Dispense:  10.2 g    Refill:  3   Lab Orders  No laboratory test(s) ordered today    Other allergy screening: Food allergy: no Medication allergy: yes Hymenoptera allergy: no Urticaria: no Eczema: on the back Also has psoriatic arthritis.  History of recurrent infections suggestive of immunodeficency: no  Diagnostics: Spirometry:  Tracings reviewed. Her effort: Good reproducible efforts. FVC: 2.27L FEV1: 1.65L, 80% predicted FEV1/FVC ratio: 73% Interpretation: No overt abnormalities noted given today's efforts with 1% improvement in FEV1 post bronchodilator treatment. Clinically feeling slightly improved.   Please see scanned spirometry results for details.  Results discussed with patient/family.   Past Medical History: Patient Active Problem List   Diagnosis Date Noted   MDD (major depressive disorder), recurrent episode, moderate (HCC) 11/23/2022   Pure hypercholesterolemia 06/26/2022   Prediabetes 06/26/2022   Tendinopathy of left rotator cuff 06/25/2022   Weight gain 04/29/2022   Mild depression 09/24/2021   PTSD (post-traumatic stress disorder) 01/06/2021   Generalized anxiety disorder 01/06/2021   Major depressive disorder, recurrent episode, severe (HCC) 11/22/2020   Past Medical History:  Diagnosis Date   Anxiety    Arthritis    DDD (degenerative disc disease), cervical    Depression    H/O adenoidectomy    History of removal of ovarian cyst    Hyperlipidemia    Past Surgical History: Past Surgical History:  Procedure Laterality Date   ADENOIDECTOMY     APPENDECTOMY     TONSILLECTOMY     Medication List:  Current  Outpatient Medications  Medication Sig Dispense Refill   albuterol (VENTOLIN HFA) 108 (90 Base) MCG/ACT inhaler Inhale 2 puffs into the lungs every 4 (four) hours as needed for wheezing or shortness of breath. 6.7 g 3   ARIPiprazole (ABILIFY) 15 MG tablet Take 1 tablet (15 mg total) by mouth at bedtime. 30  tablet 1   atorvastatin (LIPITOR) 80 MG tablet Take 1 tablet (80 mg total) by mouth daily. 90 tablet 1   budesonide-formoterol (SYMBICORT) 80-4.5 MCG/ACT inhaler Inhale 2 puffs into the lungs 2 (two) times daily with spacer and rinse mouth afterwards. 10.2 g 3   gabapentin (NEURONTIN) 300 MG capsule Take 1 capsule (300 mg total) by mouth 3 (three) times daily. 90 capsule 3   hydrOXYzine (ATARAX) 25 MG tablet Take 1 tablet (25 mg total) by mouth 3 (three) times daily as needed. 90 tablet 3   ibuprofen (ADVIL) 200 MG tablet Take 200 mg by mouth every 6 (six) hours as needed.     LORazepam (ATIVAN) 1 MG tablet Take 1 mg by mouth daily.     Magnesium 100 MG TABS Take 2-3 tablets (200-300 mg total) by mouth at bedtime. 90 tablet 1   nystatin cream (MYCOSTATIN) Apply 1 Application topically 2 (two) times daily. 30 g 2   sertraline (ZOLOFT) 100 MG tablet Take 2 tablets (200 mg total) by mouth every morning after meals 60 tablet 1   triamcinolone ointment (KENALOG) 0.1 % Apply 1 Application topically 2 (two) times daily.     No current facility-administered medications for this visit.   Allergies: Allergies  Allergen Reactions   Codeine Shortness Of Breath   Penicillins Rash   Prednisone Other (See Comments)    Has tolerated Depomedrol in past.   Tramadol Itching   Social History: Social History   Socioeconomic History   Marital status: Single    Spouse name: Not on file   Number of children: 0   Years of education: Not on file   Highest education level: Some college, no degree  Occupational History   Not on file  Tobacco Use   Smoking status: Never    Passive exposure: Past (Both parent)   Smokeless tobacco: Never  Vaping Use   Vaping status: Never Used  Substance and Sexual Activity   Alcohol use: Never   Drug use: Never   Sexual activity: Not on file  Other Topics Concern   Not on file  Social History Narrative   Not on file   Social Drivers of Health   Financial  Resource Strain: Not on file  Food Insecurity: Food Insecurity Present (07/10/2021)   Received from [facility]   Hunger Vital Sign    Worried About Running Out of Food in the Last Year: Often true    Ran Out of Food in the Last Year: Often true  Transportation Needs: Not on file  Physical Activity: Not on file  Stress: Not on file  Social Connections: Unknown (03/30/2022)   Received from [facility]   Social Network    Social Network: Not on file   Lives in a house. Smoking: second hand tobacco exposure Occupation: on disability  Environmental History: Water Damage/mildew in the house: no Carpet in the family room: no Carpet in the bedroom: no Heating: electric Cooling: window Pet: yes 1 cat - indoors and outdoors  Family History: Family History  Problem Relation Age of Onset   Allergic rhinitis Mother    Depression Mother  Depression Father    Alcohol abuse Father    Alcohol abuse Maternal Uncle    Depression Paternal Uncle    Alcohol abuse Paternal Uncle    Asthma Paternal Grandmother    Depression Cousin    Review of Systems  Constitutional:  Negative for appetite change, chills, fever and unexpected weight change.  HENT:  Positive for postnasal drip and rhinorrhea. Negative for congestion.   Eyes:  Negative for itching.  Respiratory:  Positive for cough and wheezing. Negative for chest tightness and shortness of breath.   Cardiovascular:  Negative for chest pain.  Gastrointestinal:  Negative for abdominal pain.  Genitourinary:  Negative for difficulty urinating.  Skin:  Positive for rash.  Neurological:  Negative for headaches.    Objective: BP 114/84 (BP Location: Left Arm, Patient Position: Sitting, Cuff Size: Large)   Pulse 82   Temp 98.5 F (36.9 C) (Temporal)   Ht 4' 11.45" (1.51 m)   Wt 239 lb 12.8 oz (108.8 kg)   SpO2 95%   BMI 47.70 kg/m  Body mass index is 47.7 kg/m. Physical Exam Vitals and nursing note  reviewed.  Constitutional:      Appearance: Normal appearance. She is well-developed.  HENT:     Head: Normocephalic and atraumatic.     Right Ear: Tympanic membrane and external ear normal.     Left Ear: Tympanic membrane and external ear normal.     Nose: Nose normal.     Mouth/Throat:     Mouth: Mucous membranes are moist.     Pharynx: Oropharynx is clear.  Eyes:     Conjunctiva/sclera: Conjunctivae normal.  Cardiovascular:     Rate and Rhythm: Normal rate and regular rhythm.     Heart sounds: Normal heart sounds. No murmur heard.    No friction rub. No gallop.  Pulmonary:     Effort: Pulmonary effort is normal.     Breath sounds: Normal breath sounds. No wheezing, rhonchi or rales.  Musculoskeletal:     Cervical back: Neck supple.  Skin:    General: Skin is warm.     Findings: No rash.  Neurological:     Mental Status: She is alert and oriented to person, place, and time.  Psychiatric:        Behavior: Behavior normal.   The plan was reviewed with the patient/family, and all questions/concerned were addressed.  It was my pleasure to see Loletta today and participate in her care. Please feel free to contact me with any questions or concerns.  Sincerely,  Wyline Mood, DO Allergy & Immunology  [facility] office: [phone] [facility] office: [phone]

## 2023-11-15 ENCOUNTER — Other Ambulatory Visit: Payer: Self-pay

## 2023-11-15 ENCOUNTER — Ambulatory Visit: Payer: Self-pay | Admitting: Allergy

## 2023-11-15 ENCOUNTER — Ambulatory Visit: Payer: Medicare Other | Admitting: Allergy

## 2023-11-15 ENCOUNTER — Ambulatory Visit
Admission: RE | Admit: 2023-11-15 | Discharge: 2023-11-15 | Disposition: A | Discharge status: Home / Self Care | Payer: Medicare Other | Source: Ambulatory Visit | Attending: Allergy | Admitting: Allergy

## 2023-11-15 ENCOUNTER — Encounter: Payer: Self-pay | Admitting: Allergy

## 2023-11-15 VITALS — BP 114/84 | HR 82 | Temp 98.5°F | Ht 59.45 in | Wt 239.8 lb

## 2023-11-15 DIAGNOSIS — R059 Cough, unspecified: Secondary | ICD-10-CM | POA: Diagnosis not present

## 2023-11-15 DIAGNOSIS — R053 Chronic cough: Secondary | ICD-10-CM | POA: Diagnosis not present

## 2023-11-15 DIAGNOSIS — J3089 Other allergic rhinitis: Secondary | ICD-10-CM

## 2023-11-15 DIAGNOSIS — R062 Wheezing: Secondary | ICD-10-CM | POA: Diagnosis not present

## 2023-11-15 MED ORDER — BUDESONIDE-FORMOTEROL FUMARATE 80-4.5 MCG/ACT IN AERO
2.0000 | INHALATION_SPRAY | Freq: Two times a day (BID) | RESPIRATORY_TRACT | 3 refills | Status: AC
  Filled 2023-11-15: qty 10.2, 30d supply, fill #0
  Filled 2024-03-03: qty 10.2, 30d supply, fill #1
  Filled 2024-03-10: qty 10.2, 30d supply, fill #0

## 2023-11-15 NOTE — Patient Instructions (Addendum)
Breathing/coughing Get CXR.  Daily controller medication(s): start Symbicort 2 puffs twice a day with spacer and rinse mouth afterwards. This should not make you feel hyper. Try for 2 months.   Spacer given and demonstrated proper use with inhaler. Patient understood technique and all questions/concerned were addressed.  During respiratory infections/flares:  Pretreat with albuterol 2 puffs or albuterol nebulizer.  If you need to use your albuterol nebulizer machine back to back within 15-30 minutes with no relief then please go to the ER/urgent care for further evaluation.  May use albuterol rescue inhaler 2 puffs or nebulizer every 4 to 6 hours as needed for shortness of breath, chest tightness, coughing, and wheezing. May use albuterol rescue inhaler 2 puffs 5 to 15 minutes prior to strenuous physical activities. Monitor frequency of use - if you need to use it more than twice per week on a consistent basis let us know.  Breathing control goals:  Full participation in all desired activities (may need albuterol before activity) Albuterol use two times or less a week on average (not counting use with activity) Cough interfering with sleep two times or less a month Oral steroids no more than once a year No hospitalizations   Rhinitis  Return for allergy skin testing. Will make additional recommendations based on results. Make sure you don't take any antihistamines for 3 days before the skin testing appointment. Don't put any lotion on the back and arms on the day of testing.  Plan on being here for 30-60 minutes.  Use over the counter antihistamines such as Zyrtec (cetirizine), Claritin (loratadine), Allegra (fexofenadine), or Xyzal (levocetirizine) daily as needed. May take twice a day during allergy flares. May switch antihistamines every few months. Use Flonase (fluticasone) nasal spray 1-2 sprays per nostril once a day as needed for nasal congestion.  Nasal saline spray (i.e.,  Simply Saline) or nasal saline lavage (i.e., NeilMed) is recommended as needed and prior to medicated nasal sprays.

## 2023-11-16 ENCOUNTER — Encounter: Payer: Self-pay | Admitting: Family Medicine

## 2023-11-16 ENCOUNTER — Other Ambulatory Visit: Payer: Self-pay

## 2023-11-16 ENCOUNTER — Ambulatory Visit: Payer: Medicare Other | Attending: Family Medicine | Admitting: Family Medicine

## 2023-11-16 VITALS — BP 138/84 | HR 89 | Ht 59.5 in | Wt 243.0 lb

## 2023-11-16 DIAGNOSIS — E1169 Type 2 diabetes mellitus with other specified complication: Secondary | ICD-10-CM

## 2023-11-16 DIAGNOSIS — R413 Other amnesia: Secondary | ICD-10-CM

## 2023-11-16 DIAGNOSIS — Z7985 Long-term (current) use of injectable non-insulin antidiabetic drugs: Secondary | ICD-10-CM | POA: Diagnosis not present

## 2023-11-16 MED ORDER — ONETOUCH VERIO W/DEVICE KIT
PACK | 0 refills | Status: AC
  Filled 2023-11-16: qty 1, 1d supply, fill #0
  Filled 2023-11-19: qty 1, 30d supply, fill #0

## 2023-11-16 MED ORDER — ONETOUCH ULTRASOFT LANCETS MISC
12 refills | Status: AC
  Filled 2023-11-16 – 2023-11-19 (×2): qty 100, 90d supply, fill #0

## 2023-11-16 MED ORDER — OZEMPIC (0.25 OR 0.5 MG/DOSE) 2 MG/3ML ~~LOC~~ SOPN
0.2500 mg | PEN_INJECTOR | SUBCUTANEOUS | 3 refills | Status: DC
  Filled 2023-11-16: qty 3, 28d supply, fill #0
  Filled 2023-11-19 – 2024-04-05 (×2): qty 3, 56d supply, fill #0
  Filled 2024-05-17: qty 3, 56d supply, fill #1
  Filled 2024-06-26 – 2024-07-12 (×4): qty 3, 56d supply, fill #0
  Filled 2024-07-30 – 2024-10-23 (×2): qty 3, 56d supply, fill #1
  Filled ????-??-??: fill #0

## 2023-11-16 MED ORDER — MEMANTINE HCL 5 MG PO TABS
5.0000 mg | ORAL_TABLET | Freq: Two times a day (BID) | ORAL | 3 refills | Status: DC
  Filled 2023-11-16 – 2024-03-10 (×2): qty 60, 30d supply, fill #0
  Filled 2024-07-04 (×2): qty 60, 30d supply, fill #1
  Filled 2024-07-30 – 2024-07-31 (×2): qty 60, 30d supply, fill #2

## 2023-11-16 MED ORDER — ONETOUCH ULTRA BLUE TEST VI STRP
ORAL_STRIP | 12 refills | Status: AC
  Filled 2023-11-16: qty 100, 25d supply, fill #0
  Filled 2023-11-19: qty 100, 100d supply, fill #0

## 2023-11-16 NOTE — Patient Instructions (Signed)
 Dementia  Dementia is a condition that affects the way the brain works. It often affects thinking and memory.   There are many types of dementia, including:  Alzheimer's disease. This is the most common type.  Vascular dementia. This type may happen due to a stroke.  Lewy body dementia. This type may happen to people who have Parkinson's disease.  Frontotemporal dementia. This type is caused by damage to nerve cells in certain parts of the brain.  Some people may have more than one type.  What are the causes?  Dementia is caused by damage to cells in the brain. Some causes that can't be reversed include:  Having a condition that affects the blood vessels of the brain. This may be diabetes or heart disease.  Changes to genes.  Some causes that can be reversed or slowed down include:  Injury to the brain due to:  A growth called a tumor.  A blood clot.  Too much fluid in the brain.  Taking certain medicines.  An infection.  Problems with your thyroid.  Not having enough vitamin B12 in the body.  Having a disease that causes your body's defense system, called the immune system, to attack healthy parts of your body.  What are the signs or symptoms?  Symptoms of dementia start slowly and get worse with time. They may include:  Problems remembering events or people.  Getting lost easily.  Forgetting appointments or to pay bills.  Having trouble taking a bath or putting clothes on.  Having trouble planning and making meals.  Having trouble speaking.  Changes in behavior or mood.  How is this diagnosed?  Dementia may be diagnosed based on:  Your symptoms and medical history.  A physical exam.  Tests. These may include:  Tests to check your thinking and memory to see how your brain is working.  Lab tests. You may have tests on your blood or pee (urine).  Imaging tests, such as a CT scan, a PET scan, or an MRI.  Genetic testing. This may be done if other family members have had dementia.  Your health care provider will talk  with you and your family, friends, or caregivers about your history and symptoms.  How is this treated?  Treatment depends on the cause of the dementia and should start as soon as possible. It might include:  Taking medicines for symptoms.  Taking medicines to help control or slow down the dementia.  Treating the cause of your dementia.  Your provider can help you find support groups and other members of the health care team who can help with your care.  Follow these instructions at home:  Medicines  Take medicines only as told by your provider.  Use a pill organizer or pill reminder to help you keep track of your medicines.  Avoid taking medicines for pain or for sleep. These can affect your thinking.  Lifestyle  Make healthy choices.  Be active as told by your provider.  Do not smoke, vape, or use products with nicotine or tobacco in them. If you need help quitting, talk with your provider.  Do not drink alcohol.  When you feel a lot of stress, do something that helps you relax. Your provider can give you tips.  Spend time with other people.  Make sure you get good sleep at night. These tips can help:  Try not to take naps during the day.  Keep your bedroom dark and cool.  Do not exercise in  the few hours before you go to bed.  Do not have foods or drinks with caffeine at night.  Eating and drinking  Drink enough fluid to keep your pee pale yellow.  Eat a healthy diet.  General instructions    Talk with your provider to decide on:  What things you need help with.  What your safety needs are.  Ask your provider if it's safe for you to drive.  If told, wear a bracelet that tracks where you are or shows that you're a person with memory loss.  Work with your family to make big legal or health decisions. This may include things like advance directives, medical power of attorney, or a living will.  Where to find more information  Alzheimer's Association: WesternTunes.it  [employer] on Aging: BaseRingTones.pl  World Health  Organization: VisitDestination.com.br  Contact a health care provider if:  You have any new symptoms.  Your symptoms get worse.  You have problems with swallowing.  Get help right away if:  You feel very sad or feel like you may hurt yourself or others.  You have thoughts about taking your own life.  Your family members are worried about your safety.  These symptoms may be an emergency. Take one of these steps right away:  Go to your nearest emergency room.  Call 911.  Call the National Suicide Prevention Lifeline at (301)773-5824 or 988.  Text the Crisis Text Line at 4844940928.  This information is not intended to replace advice given to you by your health care provider. Make sure you discuss any questions you have with your health care provider.  Document Revised: 01/18/2023 Document Reviewed: 01/18/2023  Elsevier Patient Education  2024 [employer].

## 2023-11-16 NOTE — Progress Notes (Signed)
 Subjective:  Patient ID: Connie Cook, female    DOB: Jul 07, 1961  Age: 62 y.o. MRN: [id number]  CC: Memory Loss   HPI Connie Cook is a 62 y.o. 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   Interval History: Discussed the use of AI scribe software for clinical note transcription with the patient, who gave verbal consent to proceed.   The patient, with a history of depression and early memory loss, presents for a follow-up visit after recent lab work revealed a new diagnosis of diabetes. She reports receiving the results via MyChart and understanding the diagnosis.  In addition to the diabetes diagnosis, the patient complained of memory loss at her last visit, labs had revealed increased risk of Alzheimer's and genetic counseling recommended.  She scored 28 out of 30 on a MMSE.  She has an upcoming genetic counseling appointment. The patient also discussed her experience with a health and wellness place, expressing frustration with scheduling issues and miscommunications. She was supposed to have appointments for weight loss, but due to various issues, including her back being out and weather conditions, she had to reschedule multiple times.  Lastly, the patient mentioned a recent visit to an allergist, where she had a chest x-ray. The results were not yet available at the time of this visit.        Past Medical History:  Diagnosis Date   Anxiety    Arthritis    DDD (degenerative disc disease), cervical    Depression    H/O adenoidectomy    History of removal of ovarian cyst    Hyperlipidemia     Past Surgical History:  Procedure Laterality Date   ADENOIDECTOMY     APPENDECTOMY     TONSILLECTOMY      Family History  Problem Relation Age of Onset   Allergic rhinitis Mother    Depression Mother    Depression Father     Alcohol abuse Father    Alcohol abuse Maternal Uncle    Depression Paternal Uncle    Alcohol abuse Paternal Uncle    Asthma Paternal Grandmother    Depression Cousin     Social History   Socioeconomic History   Marital status: Single    Spouse name: Not on file   Number of children: 0   Years of education: Not on file   Highest education level: Some college, no degree  Occupational History   Not on file  Tobacco Use   Smoking status: Never    Passive exposure: Past (Both parent)   Smokeless tobacco: Never  Vaping Use   Vaping status: Never Used  Substance and Sexual Activity   Alcohol use: Never   Drug use: Never   Sexual activity: Not on file  Other Topics Concern   Not on file  Social History Narrative   Not on file   Social Drivers of Health   Financial Resource Strain: Not on file  Food Insecurity: Food Insecurity Present (07/10/2021)   [facility]   Hunger Vital Sign    Worried About Running Out of Food in the Last Year: Often true    Ran Out of Food in the Last Year: Often true  Transportation Needs: Not on file  Physical Activity: Not on file  Stress: Not on file  Social Connections: Unknown (03/30/2022)   [facility]   Social Network    Social Network: Not on file    Allergies  Allergen Reactions   Codeine Shortness Of Breath   Penicillins Rash   Prednisone Other (See Comments)    Has tolerated Depomedrol in past.   Tramadol Itching    Outpatient Medications Prior to Visit  Medication Sig Dispense Refill   albuterol  (VENTOLIN  HFA) 108 (90 Base) MCG/ACT inhaler Inhale 2 puffs into the lungs every 4 (four) hours as needed for wheezing or shortness of breath. 6.7 g 3   ARIPiprazole  (ABILIFY ) 15 MG tablet Take 1 tablet (15 mg total) by mouth at bedtime. 30 tablet 1   atorvastatin  (LIPITOR) 80 MG tablet Take 1 tablet (80 mg total) by mouth daily. 90 tablet 1   budesonide -formoterol   (SYMBICORT ) 80-4.5 MCG/ACT inhaler Inhale 2 puffs into the lungs 2 (two) times daily with spacer and rinse mouth afterwards. 10.2 g 3   gabapentin  (NEURONTIN ) 300 MG capsule Take 1 capsule (300 mg total) by mouth 3 (three) times daily. 90 capsule 3   hydrOXYzine  (ATARAX ) 25 MG tablet Take 1 tablet (25 mg total) by mouth 3 (three) times daily as needed. 90 tablet 3   ibuprofen (ADVIL) 200 MG tablet Take 200 mg by mouth every 6 (six) hours as needed.     LORazepam  (ATIVAN ) 1 MG tablet Take 1 mg by mouth daily.     Magnesium  100 MG TABS Take 2-3 tablets (200-300 mg total) by mouth at bedtime. 90 tablet 1   nystatin  cream (MYCOSTATIN ) Apply 1 Application topically 2 (two) times daily. 30 g 2   sertraline  (ZOLOFT ) 100 MG tablet Take 2 tablets (200 mg total) by mouth every morning after meals 60 tablet 1   triamcinolone  ointment (KENALOG ) 0.1 % Apply 1 Application topically 2 (two) times daily.     No facility-administered medications prior to visit.     ROS Review of Systems  Constitutional:  Negative for activity change and appetite change.  HENT:  Negative for sinus pressure and sore throat.   Respiratory:  Negative for chest tightness, shortness of breath and wheezing.   Cardiovascular:  Negative for chest pain and palpitations.  Gastrointestinal:  Negative for abdominal distention, abdominal pain and constipation.  Genitourinary: Negative.   Musculoskeletal: Negative.   Psychiatric/Behavioral:  Negative for behavioral problems and dysphoric mood.     Objective:  BP 138/84   Pulse 89   Ht 4' 11.5 (1.511 m)   Wt 243 lb (110.2 kg)   SpO2 94%   BMI 48.26 kg/m      11/16/2023   10:21 AM 11/15/2023    2:17 PM 10/04/2023    1:57 PM  BP/Weight  Systolic BP 138 114 127  Diastolic BP 84 84 80  Wt. (Lbs) 243 239.8 230.6  BMI 48.26 kg/m2 47.7 kg/m2 45.04 kg/m2      Physical Exam Constitutional:      Appearance: She is well-developed.  Cardiovascular:     Rate and Rhythm:  Normal rate.     Heart sounds: Normal heart sounds. No murmur heard. Pulmonary:     Effort: Pulmonary effort is normal.     Breath sounds: Normal breath sounds. No wheezing or rales.  Chest:     Chest wall: No tenderness.  Abdominal:     General: Bowel sounds are normal. There is no distension.     Palpations: Abdomen is soft. There is no mass.     Tenderness: There is no abdominal tenderness.  Musculoskeletal:        General: Normal range of motion.     Right lower leg: No edema.     Left lower leg: No edema.  Neurological:     Mental Status: She is alert and oriented to person, place, and time.  Psychiatric:        Mood and Affect: Mood normal.        Latest Ref Rng & Units 03/31/2023    1:56 PM 09/30/2022   11:33 AM 06/25/2022    9:45 AM  CMP  Glucose 70 - 99 mg/dL 95  97  94   BUN 8 - 27 mg/dL 21  10  11    Creatinine 0.57 - 1.00 mg/dL 9.22  9.30  9.22   Sodium 134 - 144 mmol/L 140  140  144   Potassium 3.5 - 5.2 mmol/L 4.7  4.3  4.0   Chloride 96 - 106 mmol/L 102  101  103   CO2 20 - 29 mmol/L 23  26  23    Calcium  8.7 - 10.3 mg/dL 9.6  9.7  9.2   Total Protein 6.0 - 8.5 g/dL 7.2  7.2  7.6   Total Bilirubin 0.0 - 1.2 mg/dL 0.6  0.4  0.4   Alkaline Phos 44 - 121 IU/L 118  91  91   AST 0 - 40 IU/L 23  22  27    ALT 0 - 32 IU/L 14  14  23      Lipid Panel     Component Value Date/Time   CHOL 245 (H) 03/31/2023 1356   TRIG 100 03/31/2023 1356   HDL 51 03/31/2023 1356   LDLCALC 176 (H) 03/31/2023 1356    CBC    Component Value Date/Time   WBC 8.1 03/31/2023 1356   RBC 4.72 03/31/2023 1356   HGB 13.3 03/31/2023 1356   HCT 40.0 03/31/2023 1356   PLT 354 03/31/2023 1356   MCV 85 03/31/2023 1356   MCH 28.2 03/31/2023 1356   MCHC 33.3 03/31/2023 1356   RDW 11.9 03/31/2023 1356   LYMPHSABS 1.4 03/31/2023 1356   EOSABS 0.1 03/31/2023 1356   BASOSABS 0.1 03/31/2023 1356    Lab Results  Component Value Date   HGBA1C 6.8 (H) 10/04/2023       11/16/2023    10:23 AM  MMSE - Mini Mental State Exam  Orientation to time 4  Orientation to Place 5  Registration 3  Attention/ Calculation 5  Recall 2  Language- name 2 objects 2  Language- repeat 1  Language- follow 3 step command 3  Language- read & follow direction 1  Write a sentence 1  Copy design 1  Total score 28     Assessment & Plan:      Newly Diagnosed Diabetes A1c 6.8. Discussed the benefits of oral medication vs injectable medication (Ozempic ) which also aids in weight loss. Patient opted for Ozempic . -Discussed pathophysiology of diabetes mellitus -Start Ozempic  once weekly. -Check blood sugars once weekly. -Educate on lifestyle modifications including diet and exercise.  Early Memory Loss Scored 28/30 on memory test. Genetic testing revealed risk for Alzheimer's. Patient agreed to start medication to slow progression. -Start Namenda . -Order CT of the head. -Encourage mental stimulation activities. -Appointment with genetic counselor  Depression Positive depression screen. Currently on Zoloft  and seeing a behavioral health counselor. -Continue current management.   Obesity Ozempic  will be beneficial Increase physical activity with a goal of working towards 150 minutes/week Restrict caloric intake  General Health Maintenance -Discussed need for colonoscopy, patient wishes to delay scheduling. -Follow-up in 3 months to assess response to Ozempic  and potential dose increase.          Meds ordered this encounter  Medications   Semaglutide ,0.25 or 0.5MG /DOS, (OZEMPIC , 0.25 OR 0.5 MG/DOSE,) 2 MG/3ML SOPN    Sig: Inject 0.25 mg into the skin once a week.    Dispense:  3 mL    Refill:  3   memantine  (NAMENDA ) 5 MG tablet    Sig: Take 1 tablet (5 mg total) by mouth 2 (two) times daily.    Dispense:  60 tablet    Refill:  3    Follow-up: Return in about 3 months (around 02/14/2024) for chronic conditions.       Corrina Sabin, MD, FAAFP. [facility] [place], [place] [phone]   11/16/2023, 11:10 AM

## 2023-11-18 ENCOUNTER — Telehealth: Payer: Self-pay

## 2023-11-18 ENCOUNTER — Other Ambulatory Visit: Payer: Self-pay

## 2023-11-18 ENCOUNTER — Telehealth: Payer: Self-pay | Admitting: Family Medicine

## 2023-11-18 NOTE — Telephone Encounter (Signed)
 Pharmacy Patient Advocate Encounter   Received notification from CoverMyMeds that prior authorization for OZEMPIC  is required/requested.   Insurance verification completed.   The patient is insured through [employer] .   Per test claim: PA required; PA submitted to above mentioned insurance via CoverMyMeds Key/confirmation #/EOC [id number] Status is pending  **NO HISTORY OF TRIAL AND FAILURE OF METFORMIN 

## 2023-11-18 NOTE — Telephone Encounter (Signed)
 Don with [employer] is calling in because they're working on a PA for [employer] and he had some questions that needed to be answered in order to move forward with the PA. Please follow up with [employer].

## 2023-11-19 ENCOUNTER — Other Ambulatory Visit: Payer: Self-pay

## 2023-11-19 ENCOUNTER — Ambulatory Visit
Admission: RE | Admit: 2023-11-19 | Discharge: 2023-11-19 | Disposition: A | Discharge status: Home / Self Care | Payer: Medicare Other | Source: Ambulatory Visit | Attending: Family Medicine | Admitting: Family Medicine

## 2023-11-19 ENCOUNTER — Other Ambulatory Visit (HOSPITAL_COMMUNITY): Payer: Self-pay

## 2023-11-19 ENCOUNTER — Telehealth: Payer: Self-pay | Admitting: Family Medicine

## 2023-11-19 DIAGNOSIS — R413 Other amnesia: Secondary | ICD-10-CM | POA: Diagnosis not present

## 2023-11-19 DIAGNOSIS — I6782 Cerebral ischemia: Secondary | ICD-10-CM | POA: Diagnosis not present

## 2023-11-19 MED ORDER — METFORMIN HCL 500 MG PO TABS
500.0000 mg | ORAL_TABLET | Freq: Every day | ORAL | 1 refills | Status: DC
  Filled 2023-11-19: qty 30, 30d supply, fill #0
  Filled 2024-01-24: qty 30, 30d supply, fill #1

## 2023-11-19 NOTE — Telephone Encounter (Signed)
 Patient called stated her insurance is requesting a prior auth for Semaglutide,0.25 or 0.5MG /DOS, (OZEMPIC, 0.25 OR 0.5 MG/DOSE,) 2 MG/3ML

## 2023-11-19 NOTE — Telephone Encounter (Signed)
 Ozempic PA has already been denied as they are requiring her to try metformin before they will cover. We may be able to get approval if patient is able to start metformin. Would she be someone you would be willing to start even a low dose on?

## 2023-11-19 NOTE — Addendum Note (Signed)
 Addended by: Hoy Register on: 11/19/2023 07:48 PM   Modules accepted: Orders

## 2023-11-19 NOTE — Telephone Encounter (Signed)
 Pharmacy Patient Advocate Encounter  Received notification from [facility] that Prior Authorization for OZEMPIC  has been DENIED.   Denied. Ozempic  did not meet GLP-1 Agonists Prior Authorization Criteria requirements. Prior Authorization requires members to meet certain criteria set by the plan before a drug is covered. Ozempic  is approved when the member has tried and failed or is unable to take a non glucagon-like peptide-1 (GLP-1) oral diabetes medication (such as metformin , glipizide, etc.). In this case, the member has not tried a non GLP-1 oral diabetes medication.

## 2023-11-19 NOTE — Telephone Encounter (Signed)
 I have sent a Prescription for Metformin to the Pharmacy. Would you please inform her about the insurance requirement? Thanks.

## 2023-11-20 ENCOUNTER — Other Ambulatory Visit: Payer: Self-pay

## 2023-11-21 NOTE — Progress Notes (Signed)
 Skin testing note  RE: Saphira Lahmann MRN: [id number] DOB: [birth year] Date of Office Visit: 11/22/2023  Referring provider: Delbert Clam, MD Primary care provider: Delbert Clam, MD  Chief Complaint: allergy  testing  History of Present Illness: I had the pleasure of seeing Clydean Wegener for a skin testing visit at the [facility] on 11/22/2023. She is a 63 y.o. female, who is being followed for chronic coughing, wheezing allergic rhinitis. Her previous allergy  office visit was on 11/15/2023 with Dr. Luke. Today is a skin testing visit.   Discussed the use of AI scribe software for clinical note transcription with the patient, who gave verbal consent to proceed.  She has been trialing a new inhaler (Symbicort ) for approximately one week, with a regimen of two puffs twice daily. She reports a subjective improvement in her symptoms since starting the inhaler.      Assessment and Plan: Jnae is a 63 y.o. female with: Other allergic rhinitis Past history - Seasonal symptoms of runny nose and postnasal drip. Current use of Zyrtec and Flonase as needed. Today's skin testing borderline positive to one mold. This does not explain her symptoms.   Use Atrovent  (ipratropium) 0.03% 1-2 sprays per nostril twice a day as needed for runny nose/drainage. If no improvement will refer to ENT next.   Chronic coughing Wheezing Past history - persistent coughing and wheezing, exacerbated by cold weather and outdoor activities. History of secondhand smoke exposure. Albuterol  inhaler and nebulizer provide minimal relief. 2024 CXR normal. 2024 spirometry showed: No overt abnormalities noted given today's efforts with 1% improvement in FEV1 post bronchodilator treatment. Clinically feeling slightly improved.  Daily controller medication(s): continue Symbicort  80mcg 2 puffs twice a day with spacer and rinse mouth afterwards. Try for 2 months.  During respiratory infections/flares:   Pretreat with albuterol  2 puffs or albuterol  nebulizer.  If you need to use your albuterol  nebulizer machine back to back within 15-30 minutes with no relief then please go to the ER/urgent care for further evaluation.  May use albuterol  rescue inhaler 2 puffs or nebulizer every 4 to 6 hours as needed for shortness of breath, chest tightness, coughing, and wheezing. May use albuterol  rescue inhaler 2 puffs 5 to 15 minutes prior to strenuous physical activities. Monitor frequency of use - if you need to use it more than twice per week on a consistent basis let us  know.  Get spirometry at next visit.  Return in about 2 months (around 01/20/2024).  Meds ordered this encounter  Medications   ipratropium (ATROVENT ) 0.03 % nasal spray    Sig: Place 1-2 sprays into both nostrils 2 (two) times daily as needed (nasal drainage).    Dispense:  30 mL    Refill:  5   Lab Orders  No laboratory test(s) ordered today    Diagnostics: Skin Testing: Environmental allergy  panel. Today's skin testing borderline positive to one mold. Results discussed with patient/family.  Airborne Adult Perc - 11/22/23 1404     Time Antigen Placed 1405    Allergen Manufacturer Jestine    Location Back    Number of Test 55    1. Control-Buffer 50% Glycerol Negative    2. Control-Histamine 3+    3. Bahia Negative    4. Bermuda Negative    5. Johnson Negative    6. Kentucky  Blue Negative    7. Meadow Fescue Negative    8. Perennial Rye Negative    9. Timothy Negative  10. Ragweed Mix Negative    11. Cocklebur Negative    12. Plantain,  English Negative    13. Baccharis Negative    14. Dog Fennel Negative    15. Russian Thistle Negative    16. Lamb's Quarters Negative    17. Sheep Sorrell Negative    18. Rough Pigweed Negative    19. Marsh Elder, Rough Negative    20. Mugwort, Common Negative    21. Box, Elder Negative    22. Cedar, red Negative    23. Sweet Gum Negative    24. Pecan Pollen Negative     25. Pine Mix Negative    26. Walnut, Black Pollen Negative    27. Red Mulberry Negative    28. Ash Mix Negative    29. Birch Mix Negative    30. Beech American Negative    31. Cottonwood, Eastern Negative    32. Hickory, White Negative    33. Maple Mix Negative    34. Oak, Eastern Mix Negative    35. Sycamore Eastern Negative    36. Alternaria Alternata Negative    37. Cladosporium Herbarum Negative    38. Aspergillus Mix Negative    39. Penicillium Mix Negative    40. Bipolaris Sorokiniana (Helminthosporium) Negative    41. Drechslera Spicifera (Curvularia) Negative    42. Mucor Plumbeus Negative    43. Fusarium Moniliforme 2+    44. Aureobasidium Pullulans (pullulara) Negative    45. Rhizopus Oryzae Negative    46. Botrytis Cinera Negative    47. Epicoccum Nigrum Negative    48. Phoma Betae Negative    49. Dust Mite Mix Negative    50. Cat Hair 10,000 BAU/ml Negative    51.  Dog Epithelia Negative    52. Mixed Feathers Negative    53. Horse Epithelia Negative    54. Cockroach, German Negative    55. Tobacco Leaf Negative             Intradermal - 11/22/23 1429     Time Antigen Placed 1429    Allergen Manufacturer Jestine    Location Arm    Number of Test 15    Control Negative    Bahia Negative    Bermuda Negative    Johnson Negative    7 Grass Negative    Ragweed Mix Negative    Weed Mix Negative    Tree Mix Negative    Mold 1 Negative    Mold 2 Negative    Mold 3 Negative    Mite Mix Negative    Cat Negative    Dog Negative    Cockroach Negative             Previous notes and tests were reviewed. The plan was reviewed with the patient/family, and all questions/concerned were addressed.  It was my pleasure to see Brielyn today and participate in her care. Please feel free to contact me with any questions or concerns.  Sincerely,  Orlan Cramp, DO Allergy  & Immunology  [facility]  [place] office:  [phone] [facility] office: [phone]

## 2023-11-22 ENCOUNTER — Other Ambulatory Visit: Payer: Self-pay

## 2023-11-22 ENCOUNTER — Encounter: Payer: Self-pay | Admitting: Allergy

## 2023-11-22 ENCOUNTER — Ambulatory Visit: Payer: Medicare Other | Admitting: Allergy

## 2023-11-22 DIAGNOSIS — J3089 Other allergic rhinitis: Secondary | ICD-10-CM

## 2023-11-22 MED ORDER — IPRATROPIUM BROMIDE 0.03 % NA SOLN
1.0000 | Freq: Two times a day (BID) | NASAL | 5 refills | Status: AC | PRN
  Filled 2023-11-22: qty 30, 75d supply, fill #0
  Filled 2024-03-03: qty 30, 75d supply, fill #1
  Filled 2024-03-10: qty 30, 75d supply, fill #0

## 2023-11-22 NOTE — Patient Instructions (Addendum)
 Today's skin testing borderline positive to one mold. I don't think this is causing your symptoms.    Results given.  Rhinitis  Use Atrovent  (ipratropium) 0.03% 1-2 sprays per nostril twice a day as needed for runny nose/drainage.  Breathing/coughing Daily controller medication(s): continue Symbicort  80mcg 2 puffs twice a day with spacer and rinse mouth afterwards. Try for 2 months.  During respiratory infections/flares:  Pretreat with albuterol  2 puffs or albuterol  nebulizer.  If you need to use your albuterol  nebulizer machine back to back within 15-30 minutes with no relief then please go to the ER/urgent care for further evaluation.  May use albuterol  rescue inhaler 2 puffs or nebulizer every 4 to 6 hours as needed for shortness of breath, chest tightness, coughing, and wheezing. May use albuterol  rescue inhaler 2 puffs 5 to 15 minutes prior to strenuous physical activities. Monitor frequency of use - if you need to use it more than twice per week on a consistent basis let us  know.  Breathing control goals:  Full participation in all desired activities (may need albuterol  before activity) Albuterol  use two times or less a week on average (not counting use with activity) Cough interfering with sleep two times or less a month Oral steroids no more than once a year No hospitalizations   Return in about 2 months (around 01/20/2024). Or sooner if needed.   Mold Control Mold and fungi can grow on a variety of surfaces provided certain temperature and moisture conditions exist.  Outdoor molds grow on plants, decaying vegetation and soil. The major outdoor mold, Alternaria and Cladosporium, are found in very high numbers during hot and dry conditions. Generally, a late summer - fall peak is seen for common outdoor fungal spores. Rain will temporarily lower outdoor mold spore count, but counts rise rapidly when the rainy period ends. The most important indoor molds are Aspergillus and  Penicillium. Dark, humid and poorly ventilated basements are ideal sites for mold growth. The next most common sites of mold growth are the bathroom and the kitchen. Outdoor (Seasonal) Mold Control Use air conditioning and keep windows closed. Avoid exposure to decaying vegetation. Avoid leaf raking. Avoid grain handling. Consider wearing a face mask if working in moldy areas.  Indoor (Perennial) Mold Control  Maintain humidity below 50%. Get rid of mold growth on hard surfaces with water, detergent and, if necessary, 5% bleach (do not mix with other cleaners). Then dry the area completely. If mold covers an area more than 10 square feet, consider hiring an indoor environmental professional. For clothing, washing with soap and water is best. If moldy items cannot be cleaned and dried, throw them away. Remove sources e.g. contaminated carpets. Repair and seal leaking roofs or pipes. Using dehumidifiers in damp basements may be helpful, but empty the water and clean units regularly to prevent mildew from forming. All rooms, especially basements, bathrooms and kitchens, require ventilation and cleaning to deter mold and mildew growth. Avoid carpeting on concrete or damp floors, and storing items in damp areas.

## 2023-11-23 ENCOUNTER — Other Ambulatory Visit: Payer: Self-pay

## 2023-11-24 ENCOUNTER — Ambulatory Visit: Payer: Medicare Other | Admitting: Allergy

## 2023-11-24 DIAGNOSIS — K080 Exfoliation of teeth due to systemic causes: Secondary | ICD-10-CM | POA: Diagnosis not present

## 2023-11-25 ENCOUNTER — Telehealth: Payer: Self-pay | Admitting: Pharmacist

## 2023-11-25 ENCOUNTER — Other Ambulatory Visit: Payer: Self-pay | Admitting: Family Medicine

## 2023-11-25 ENCOUNTER — Other Ambulatory Visit: Payer: Self-pay

## 2023-11-25 ENCOUNTER — Encounter: Payer: Self-pay | Admitting: Allergy

## 2023-11-25 ENCOUNTER — Encounter: Payer: Self-pay | Admitting: Family Medicine

## 2023-11-25 MED ORDER — VITAMIN B-12 1000 MCG PO TABS
1000.0000 ug | ORAL_TABLET | Freq: Every day | ORAL | 1 refills | Status: AC
  Filled 2023-11-25 – 2024-07-31 (×3): qty 90, 90d supply, fill #0

## 2023-11-25 NOTE — Telephone Encounter (Signed)
 Called patient to schedule an appointment for DM teaching. I was unable to reach the patient so I left a HIPAA-compliant message requesting that the patient return my call.   Herlene Fleeta Morris, PharmD, JAQUELINE, CPP Clinical Pharmacist [facility] [phone]

## 2023-11-26 ENCOUNTER — Other Ambulatory Visit: Payer: Self-pay

## 2023-11-26 DIAGNOSIS — F410 Panic disorder [episodic paroxysmal anxiety] without agoraphobia: Secondary | ICD-10-CM | POA: Diagnosis not present

## 2023-11-26 DIAGNOSIS — F4312 Post-traumatic stress disorder, chronic: Secondary | ICD-10-CM | POA: Diagnosis not present

## 2023-11-26 DIAGNOSIS — F411 Generalized anxiety disorder: Secondary | ICD-10-CM | POA: Diagnosis not present

## 2023-11-26 DIAGNOSIS — F331 Major depressive disorder, recurrent, moderate: Secondary | ICD-10-CM | POA: Diagnosis not present

## 2023-11-26 DIAGNOSIS — M545 Low back pain, unspecified: Secondary | ICD-10-CM | POA: Diagnosis not present

## 2023-11-26 MED ORDER — SERTRALINE HCL 100 MG PO TABS
200.0000 mg | ORAL_TABLET | Freq: Every morning | ORAL | 1 refills | Status: DC
  Filled 2023-11-26: qty 60, 30d supply, fill #0

## 2023-11-26 MED ORDER — MAG GLYCINATE 100 MG PO TABS
2.0000 | ORAL_TABLET | Freq: Every day | ORAL | 1 refills | Status: DC
  Filled 2024-07-30: qty 90, fill #0

## 2023-11-26 MED ORDER — ARIPIPRAZOLE 15 MG PO TABS
15.0000 mg | ORAL_TABLET | Freq: Every day | ORAL | 1 refills | Status: DC
  Filled 2023-11-26 – 2024-07-30 (×2): qty 30, 30d supply, fill #0

## 2023-11-30 DIAGNOSIS — M545 Low back pain, unspecified: Secondary | ICD-10-CM | POA: Diagnosis not present

## 2023-12-06 ENCOUNTER — Other Ambulatory Visit: Payer: Self-pay

## 2023-12-06 DIAGNOSIS — M545 Low back pain, unspecified: Secondary | ICD-10-CM | POA: Diagnosis not present

## 2023-12-07 ENCOUNTER — Ambulatory Visit: Payer: Medicare Other | Admitting: Medical Genetics

## 2023-12-08 ENCOUNTER — Other Ambulatory Visit: Payer: Self-pay

## 2023-12-09 DIAGNOSIS — M545 Low back pain, unspecified: Secondary | ICD-10-CM | POA: Diagnosis not present

## 2023-12-14 DIAGNOSIS — M545 Low back pain, unspecified: Secondary | ICD-10-CM | POA: Diagnosis not present

## 2023-12-16 DIAGNOSIS — M545 Low back pain, unspecified: Secondary | ICD-10-CM | POA: Diagnosis not present

## 2023-12-21 ENCOUNTER — Other Ambulatory Visit: Payer: Self-pay

## 2023-12-21 DIAGNOSIS — M545 Low back pain, unspecified: Secondary | ICD-10-CM | POA: Diagnosis not present

## 2023-12-22 NOTE — Telephone Encounter (Unsigned)
 Copied from CRM [id number]. Topic: Clinical - Medical Advice >> Dec 22, 2023  1:42 PM Baldomero Bone wrote: Reason for CRM: Patient needs advice on how to use her new Glucose Monitor. Callback number is [phone]

## 2024-01-04 DIAGNOSIS — M545 Low back pain, unspecified: Secondary | ICD-10-CM | POA: Diagnosis not present

## 2024-01-11 ENCOUNTER — Encounter: Payer: Self-pay | Admitting: Medical Genetics

## 2024-01-11 ENCOUNTER — Ambulatory Visit: Payer: Medicare Other | Admitting: Medical Genetics

## 2024-01-11 VITALS — BP 128/72 | HR 96 | Ht 60.0 in | Wt 227.2 lb

## 2024-01-11 DIAGNOSIS — F411 Generalized anxiety disorder: Secondary | ICD-10-CM | POA: Diagnosis not present

## 2024-01-11 DIAGNOSIS — R413 Other amnesia: Secondary | ICD-10-CM

## 2024-01-11 DIAGNOSIS — F332 Major depressive disorder, recurrent severe without psychotic features: Secondary | ICD-10-CM

## 2024-01-11 NOTE — Progress Notes (Signed)
 MEDICAL GENETICS NEW PATIENT EVALUATION  Patient name: Connie Cook DOB: 06-Jul-1961 Age: 63 y.o. 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 It is unlikely she has an early-onset form of dementia, in which case additional genetic testing is not recommended at this time. Most patients develop dementia due to a combination of genetic risk and environmental factors, such that Connie Cook can reduce her risk for dementia (as well as many other chronic conditions) with appropriate lifestyle modifications that we discussed today.  Connie Cook reported a history of cancer in several family members, and was interested in discussing her risk further with a cancer genetic counselor. We will refer her to the cancer center for genetic counseling/testing related to her cancer risk.  Recommendations: No additional genetic testing at this time. Will refer Connie Cook for cancer genetic counseling. Discussed lifestyle factors to mitigate risk for dementia - movement, sleep, weight loss, relationships, etc. Continue follow up with current medical providers per their recommendations.  Follow up in genetics clinic as needed.   HPI: Connie Cook is a 63 y.o. assigned female at birth who presents today for an initial genetics evaluation for memory issues and carrying a copy of APOE4. She provided the history. This information, along with a review of pertinent records, labs, and radiology studies, is summarized below.  [name] felt like her memory issues started around age 49 when she lost a previous job, after which she started to gained weight and sleep less. A sleep  study showed she had sleep apnea, but she has not been able to wear a mask. She continues to have sleep apnea and wears a mouth device. She also developed diabetes and high cholesterol. She has a history of depression and anxiety for most of her life as well. She has seen therapists on/off over the years. She denies any family history of memory issues. Her primary provider (Dr. Alvis Lemmings) performed APOE testing that [name] has an E3/E4 genotype. A head CT was also performed that showed minor chronic ischemic changes. She was referred to genetics for further discussion of her APOE results.  Connie Cook does have several family members with cancer, however most of these individuals were smokers. She was interested in seeing a cancer Dentist.  Past Medical History: Past Medical History:  Diagnosis Date   Anxiety    Arthritis    DDD (degenerative disc disease), cervical    Depression    H/O adenoidectomy    History of removal of ovarian cyst    Hyperlipidemia    Patient Active Problem List   Diagnosis Date Noted   MDD (major depressive disorder), recurrent episode, moderate (HCC) 11/23/2022   Pure hypercholesterolemia 06/26/2022   Type 2 diabetes mellitus (HCC) 06/26/2022   Tendinopathy of left rotator cuff 06/25/2022   Weight gain 04/29/2022   Mild depression 09/24/2021   PTSD (post-traumatic stress disorder) 01/06/2021   Generalized anxiety disorder 01/06/2021   Major depressive disorder, recurrent episode, severe (HCC) 11/22/2020   Past Surgical History:  Past Surgical History:  Procedure Laterality Date   ADENOIDECTOMY     APPENDECTOMY     TONSILLECTOMY     Medications: Current Outpatient Medications on File Prior to Visit  Medication Sig Dispense Refill   albuterol (VENTOLIN HFA) 108 (90 Base) MCG/ACT inhaler Inhale 2 puffs into the lungs every 4 (four) hours as needed for wheezing or shortness of breath. 6.7 g 3   ARIPiprazole (ABILIFY) 15 MG tablet Take 1 tablet (15  mg total) by mouth at bedtime. 30 tablet 1   ARIPiprazole (ABILIFY) 15 MG tablet Take 1 tablet (15 mg total) by mouth at bedtime. 30 tablet 1   atorvastatin (LIPITOR) 80 MG tablet Take 1 tablet (80 mg total) by mouth daily. 90 tablet 1   Blood Glucose Monitoring Suppl (ONETOUCH VERIO) w/Device KIT USE AS DIRECTED 1 kit 0   budesonide-formoterol (SYMBICORT) 80-4.5 MCG/ACT inhaler Inhale 2 puffs into the lungs 2 (two) times daily with spacer and rinse mouth afterwards. 10.2 g 3   cyanocobalamin (VITAMIN B12) 1000 MCG tablet Take 1 tablet (1,000 mcg total) by mouth daily. 90 tablet 1   gabapentin (NEURONTIN) 300 MG capsule Take 1 capsule (300 mg total) by mouth 3 (three) times daily. 90 capsule 3   glucose blood (ONETOUCH ULTRA BLUE TEST) test strip Use as instructed 100 each 12   hydrOXYzine (ATARAX) 25 MG tablet Take 1 tablet (25 mg total) by mouth 3 (three) times daily as needed. 90 tablet 3   ibuprofen (ADVIL) 200 MG tablet Take 200 mg by mouth every 6 (six) hours as needed.     ipratropium (ATROVENT) 0.03 % nasal spray Place 1-2 sprays into both nostrils 2 (two) times daily as needed (nasal drainage). 30 mL 5   Lancets (ONETOUCH ULTRASOFT) lancets Use as instructed daily 100 each 12   LORazepam (ATIVAN) 1 MG tablet Take 1 mg by mouth daily.     Magnesium 100 MG TABS Take 2-3 tablets (200-300 mg total) by mouth at bedtime. 90 tablet 1   Magnesium Bisglycinate (MAG GLYCINATE) 100 MG TABS Take 2-3 tablets by mouth ONCE A  DAY AT BEDTIME. 90 tablet 1   memantine (NAMENDA) 5 MG tablet Take 1 tablet (5 mg total) by mouth 2 (two) times daily. 60 tablet 3   metFORMIN (GLUCOPHAGE) 500 MG tablet Take 1 tablet (500 mg total) by mouth daily with breakfast. 30 tablet 1   nystatin cream (MYCOSTATIN) Apply 1 Application topically 2 (two) times daily. 30 g 2   Semaglutide,0.25 or 0.5MG /DOS, (OZEMPIC, 0.25 OR 0.5 MG/DOSE,) 2 MG/3ML SOPN Inject 0.25 mg into the skin once a week. 3 mL 3   sertraline (ZOLOFT) 100  MG tablet Take 2 tablets (200 mg total) by mouth every morning after meals 60 tablet 1   sertraline (ZOLOFT) 100 MG tablet Take 2 tablets (200 mg total) by mouth every morning AFTER MEALS. 60 tablet 1   triamcinolone ointment (KENALOG) 0.1 % Apply 1 Application topically 2 (two) times daily.     No current facility-administered medications on file prior to visit.   Allergies:  Allergies  Allergen Reactions   Codeine Shortness Of Breath   Penicillins Rash   Prednisone Other (See Comments)    Has tolerated Depomedrol in past.   Tramadol Itching   Review of Systems: Negative except as noted in the HPI  Family History: Family History  Problem Relation Age of Onset   Allergic rhinitis Mother    Depression Mother    Depression Father    Alcohol abuse Father    Alcohol abuse Maternal Uncle    Depression Paternal Uncle    Alcohol abuse Paternal Uncle    Asthma Paternal Grandmother  Depression Cousin   Self-reported ancestry:  Mixed European, Native American  Consanguinity: Denies Please see the Dentist note for additional information  Social History: Lives by herself in [place], not currently employed  Vitals: Weight: 227.2 lb Height: 5'0"  Genetics Physical Exam:  Constitution: The patient is active and alert  Head: No abnormalities detected in: head, hairline, shape or size    Anterior fontanelle flat: not flat    Anterior fontanelle open: not open    Bitemporal narrowing: forehead not narrow    Frontal bossing: no frontal bossing    Macrocephaly: not macrocephalic    Microcephaly: not microcephalic    Plagiocephaly: not plagiocephalic  Face: No abnormalities detected in: face, midface or shape    Coarse facial features: no coarse facies    Midfacial hypoplasia: no midfacial hypoplasia  Cardiac: No abnormalities detected in: cardiovascular system    Abnormal distal perfusion: normal distal perfusion    Irregular rate: heart rate regular     Irregular rhythm: regular rhythm    Murmur: no murmur  Lungs: No abnormalities detected in: pulmonary system, bilateral auscultation or effort  Neurological: No abnormalities detected in: deep tendon reflexes or facial movement Hair, Nails, and Skin: (comments: ? Small plaques on lower legs)  Extremities: No abnormalities detected in: extremities    Asymmetric girth: symmetric girth    Contractures: no joint contractures    Limited range of motion: non-limited ROM   [place]aly Haldeman-Englert, MD [facility]Genetics Date: 01/11/2024 Time: 0940   Total time spent: 60 minutes Time spent includes face to face and non-face to face care for the patient on the date of this encounter (history and physical, genetic counseling, coordination of care, data gathering and/or documentation as outlined).  Genetic counselor: Lambert Mody, MS, [facility] ([place])

## 2024-01-11 NOTE — Progress Notes (Signed)
 GENETIC COUNSELING NEW PATIENT EVALUATION Patient name: Connie Cook DOB: [birth year] Age: 63 y.o. 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 She is unaccompanied by at today's visit.  Prior genetic testing has been performed. 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   Family History: See pedigree obtained during today's visit under History->Family->Pedigree.  The family history was notable for the following: Sister, 57 yo, alive and well, no memory changes. Her son with ADHD. Her daughter with depression, anxiety, and PCOS.  Paternal Family History Father, deceased at 20 yo, from lung cancer.  He had a history of smoking.  He was also diagnosed with psoriatic arthritis and Parkinson's disease. Half-sister, in her 67s, no contact with this family member. Uncle, deceased in a plane accident. Aunt, deceased in a helicopter accident. Uncle, deceased in his 7s from bladder cancer. He had a history of smoking. Grandfather, deceased from an unknown cause. Grandmother, deceased at 52 yo from congestive heart failure.  Maternal Family History Mother, deceased at 29 yo from pancreatic cancer.  She had a history of smoking. Uncle, deceased from stomach cancer, history of smoking. Uncle, deceased from colon cancer, history of smoking. Uncle, deceased from lung cancer, history of smoking. His son, deceased from throat cancer, history of smoking. His son, deceased from an unknown heart condition, with suspected autism spectrum disorder. Aunt, deceased from unknown kidney condition.  Mother's ethnicity: Mixed European,  Native American Father's ethnicity: [place] Consangunity: Denies   Prior Genetic testing: APOE genotyping: E3/E4.  Genetic Counseling: Elane Leba Tibbitts is a 63 y.o. 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  Connie Cook first noticed memory changes around the age of 63 yo.  At the time, she was going through several stressors including being laid off from work.  Around this time she also began to have increased fatigue and was later diagnosed with severe sleep apnea.  Connie Cook is also diagnosed with depression, anxiety, psoriatic arthritis, and diabetes.  There is no additional family history of memory loss.  She continues to struggle with memory loss and recently had APOE genotyping. 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  Individuals with this genotype are are a 2-3 fold increased risk for Alzheimer's disease.  Not everyone with the E3/E4 genotype will develop Alzheimer's disease.  Connie Cook also disclosed that she feels she may have autism spectrum disorder, though she has never had formal evaluation.  As a child, she had repetitive behaviors such as head banging, rocking back and forth, and face picking.  She also had sensory difficulties, particularly with clothing and struggled with transitions.  She reports never wanting any romantic relationships and having few friendships.  We discussed that conditions like diabetes, Alzheimer's disease, and even autism spectrum disorder are often multifactorial in nature, meaning that a combination of genetic/familial risk and environmental factors, together, cause symptoms, rather than one single genetic condition.  Connie Cook E3/E4 genotype does not guarantee she will develop Alzheimer's disease and there are many lifestyle factors that can be protective against developing symptoms like regular exercise, good sleep, and a healthy diet.  We discussed some  opportunities for  building in some new healthy habits into [name]'s routine to try and lower her risk for these multifactorial conditions. No further genetic testing is recommended at this time.  Connie Cook also disclosed a family history of cancer including the following individuals: Mother, deceased at 77 yo from pancreatic cancer.  She had a history of smoking. Maternal uncle, deceased from stomach cancer, history of smoking. Maternal uncle, deceased from colon cancer, history of smoking. Maternal uncle, deceased from lung cancer, history of smoking. His son, deceased from throat cancer, history of smoking. Father, deceased at 54 yo, from lung cancer, history of smoking. Paternal uncle, deceased in his 21s from bladder cancer, history of smoking.  We discussed that the extensive smoking history reported for these individuals likely explains their cancer diagnoses.  However given the number of individuals with cancer and her first degree relative with pancreatic cancer, Connie Cook may consider genetic testing to better understand her own cancer risk.  A referral for cancer genetic counseling will be placed following today's appointment.  Recommendations: Referral to cancer genetic counseling. Consider lifestyle changes to improve multifactorial risk for health conditions discussed. Continue follow-up with other healthcare providers as recommended.  Date: 01/11/2024 Total time spent: 75 minutes Genetic Counselor-only time: 35 minutes  Time spent includes face to face and non-face to face care for the patient on the date of this encounter (history, genetic counseling, coordination of care, data gathering and/or documentation as outlined).   Lambert Mody MS [facility] Certified Genetic Counselor [facility] [employer]

## 2024-01-11 NOTE — Patient Instructions (Signed)
 Recommendations: No additional genetic testing at this time. Will refer Ms. Hendrickson for cancer genetic counseling. Discussed lifestyle factors to mitigate risk for dementia - movement, sleep, weight loss, relationships, etc. Continue follow up with current medical providers per their recommendations.  Follow up in genetics clinic as needed.

## 2024-01-12 ENCOUNTER — Telehealth: Payer: Self-pay

## 2024-01-12 DIAGNOSIS — M545 Low back pain, unspecified: Secondary | ICD-10-CM | POA: Diagnosis not present

## 2024-01-12 NOTE — Telephone Encounter (Signed)
 Patient was called and she states that she has called [facility] and they informed her how to use her machine.    Copied from CRM [id number]. Topic: Clinical - Medical Advice >> Jan 11, 2024  9:42 AM Connie Cook wrote: Reason for CRM: Patient needs someone to show her how to use her glucose monitor, please call [phone]

## 2024-01-18 ENCOUNTER — Other Ambulatory Visit: Payer: Self-pay

## 2024-01-18 DIAGNOSIS — F410 Panic disorder [episodic paroxysmal anxiety] without agoraphobia: Secondary | ICD-10-CM | POA: Diagnosis not present

## 2024-01-18 DIAGNOSIS — F4312 Post-traumatic stress disorder, chronic: Secondary | ICD-10-CM | POA: Diagnosis not present

## 2024-01-18 DIAGNOSIS — F411 Generalized anxiety disorder: Secondary | ICD-10-CM | POA: Diagnosis not present

## 2024-01-18 DIAGNOSIS — F331 Major depressive disorder, recurrent, moderate: Secondary | ICD-10-CM | POA: Diagnosis not present

## 2024-01-18 MED ORDER — ARIPIPRAZOLE 15 MG PO TABS
ORAL_TABLET | ORAL | 1 refills | Status: DC
Start: 2024-01-18 — End: 2024-09-26
  Filled 2024-01-18: qty 30, 30d supply, fill #0

## 2024-01-18 MED ORDER — MAGNESIUM GLYCINATE 100 MG PO CAPS
ORAL_CAPSULE | ORAL | 1 refills | Status: DC
  Filled 2024-07-30: qty 90, fill #0

## 2024-01-18 MED ORDER — SERTRALINE HCL 100 MG PO TABS
200.0000 mg | ORAL_TABLET | Freq: Every morning | ORAL | 1 refills | Status: DC
  Filled 2024-01-18 – 2024-07-30 (×3): qty 60, 30d supply, fill #0

## 2024-01-19 ENCOUNTER — Ambulatory Visit: Payer: Medicare Other | Admitting: Allergy

## 2024-01-24 ENCOUNTER — Other Ambulatory Visit: Payer: Self-pay

## 2024-01-24 ENCOUNTER — Ambulatory Visit: Payer: Self-pay | Admitting: Family Medicine

## 2024-01-24 NOTE — Telephone Encounter (Signed)
 Noted.

## 2024-01-24 NOTE — Telephone Encounter (Signed)
 Copied from CRM [id number]. Topic: Clinical - Red Word Triage >> Jan 24, 2024  8:46 AM Shon Hale wrote: Red Word that prompted transfer to Nurse Triage: Cyst - causing pain and has come up.   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  This is an ongoing congenital issue however it usually goes away on its own.  It is painful and red.  She has been applying warm compresses for the past week and she thinks it has opened.  She denied fever or chills.  She inquired about an antibiotic.  Patient scheduled for next day appointment with a different provider as her pcp did not have availability.    Reason for Disposition  [1] Boil AND [2] not improved > 3 days following Care Advice  Answer Assessment - Initial Assessment Questions 1. APPEARANCE of BOIL: "What does the boil look like?"      Red and swollen  2. LOCATION: "Where is the boil located?"      Left ear  3. NUMBER: "How many boils are there?"      1 4. SIZE: "How big is the boil?" (e.g., inches, cm; compare to size of a coin or other object)     Dime size  5. ONSET: "When did the boil start?"     1 week ago  6. PAIN: "Is there any pain?" If Yes, ask: "How bad is the pain?"   (Scale 1-10; or mild, moderate, severe)     7/10 7. FEVER: "Do you have a fever?" If Yes, ask: "What is it, how was it measured, and when did it start?"      No  8. SOURCE: "Have you been around anyone with boils or other Staph infections?" "Have you ever had boils before?"     Congenital problem  9. OTHER SYMPTOMS: "Do you have any other symptoms?" (e.g.,  shaking chills, weakness, rash elsewhere on body)     No chills  Protocols used: Boil (Skin Abscess)-A-AH

## 2024-01-25 ENCOUNTER — Telehealth (HOSPITAL_BASED_OUTPATIENT_CLINIC_OR_DEPARTMENT_OTHER): Admitting: Internal Medicine

## 2024-01-25 ENCOUNTER — Other Ambulatory Visit: Payer: Self-pay

## 2024-01-25 ENCOUNTER — Ambulatory Visit: Admitting: Internal Medicine

## 2024-01-25 DIAGNOSIS — L089 Local infection of the skin and subcutaneous tissue, unspecified: Secondary | ICD-10-CM

## 2024-01-25 DIAGNOSIS — L729 Follicular cyst of the skin and subcutaneous tissue, unspecified: Secondary | ICD-10-CM

## 2024-01-25 MED ORDER — SULFAMETHOXAZOLE-TRIMETHOPRIM 800-160 MG PO TABS
1.0000 | ORAL_TABLET | Freq: Two times a day (BID) | ORAL | 0 refills | Status: DC
  Filled 2024-01-25: qty 14, 7d supply, fill #0

## 2024-01-25 NOTE — Progress Notes (Signed)
 Virtual Visit via Video Note  I connected with Connie Cook on 01/25/2024 at 4:57 PM by a video enabled telemedicine application and verified that I am speaking with the correct person using two identifiers.  Location: Patient: home Provider: Office   I discussed the limitations of evaluation and management by telemedicine and the availability of in person appointments. The patient expressed understanding and agreed to proceed.  History of Present Illness: 63 y.o. 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  History of Present Illness   The patient, with a history of diabetes, presents with a preauricular cyst on the left ear that has opened and is draining fluid. 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 The patient initially waited to see if the swelling would subside on its own, but when it did not, she applied warm compresses which caused the cyst to open. Initially, the cyst was painful, but the pain has since decreased significantly after it opened and started draining. The patient managed the pain with ibuprofen. The fluid draining from the cyst was initially pus-filled, but now appears to be a mixture of liquid and blood.       Outpatient Encounter Medications as of 01/25/2024  Medication Sig   albuterol (VENTOLIN HFA) 108 (90 Base) MCG/ACT inhaler Inhale 2 puffs into the lungs every 4 (four) hours as needed for wheezing or shortness of breath.   ARIPiprazole (ABILIFY) 15 MG tablet Take 1 tablet (15 mg total) by mouth at bedtime.   ARIPiprazole (ABILIFY) 15 MG tablet Take 1 tablet (15 mg total) by mouth at bedtime.   ARIPiprazole (ABILIFY) 15 MG tablet Take  1 tablet by mouth once a day at bedtime   atorvastatin (LIPITOR) 80 MG tablet Take 1 tablet (80 mg total) by mouth daily.   Blood Glucose Monitoring Suppl (ONETOUCH VERIO) w/Device KIT USE AS DIRECTED   budesonide-formoterol (SYMBICORT) 80-4.5 MCG/ACT inhaler Inhale 2 puffs into the lungs 2 (two) times daily with spacer and rinse mouth afterwards.   cyanocobalamin (VITAMIN B12) 1000 MCG tablet Take 1 tablet (1,000 mcg total) by mouth daily.   gabapentin (NEURONTIN) 300 MG capsule Take 1 capsule (300 mg total) by mouth 3 (three) times daily.   glucose blood (ONETOUCH ULTRA BLUE TEST) test strip Use as instructed   hydrOXYzine (ATARAX) 25 MG tablet Take 1 tablet (25 mg total) by mouth 3 (three) times daily as needed.   ibuprofen (ADVIL) 200 MG tablet Take 200 mg by mouth every 6 (six) hours as needed.   ipratropium (ATROVENT) 0.03 % nasal spray Place 1-2 sprays into both nostrils 2 (two) times daily as needed (nasal drainage).   Lancets (ONETOUCH ULTRASOFT) lancets Use as instructed daily   LORazepam (ATIVAN) 1 MG tablet Take 1 mg by mouth daily.   Magnesium 100 MG TABS Take 2-3 tablets (200-300 mg total) by mouth at bedtime.   Magnesium Bisglycinate (MAG GLYCINATE) 100 MG TABS Take 2-3 tablets by mouth ONCE A  DAY AT BEDTIME.   Magnesium Glycinate 100 MG CAPS Take 2-3 tablets by mouth as bedtime   memantine (NAMENDA) 5 MG tablet Take 1 tablet (5 mg total) by mouth 2 (two) times daily.   metFORMIN (  GLUCOPHAGE) 500 MG tablet Take 1 tablet (500 mg total) by mouth daily with breakfast.   nystatin cream (MYCOSTATIN) Apply 1 Application topically 2 (two) times daily.   Semaglutide,0.25 or 0.5MG /DOS, (OZEMPIC, 0.25 OR 0.5 MG/DOSE,) 2 MG/3ML SOPN Inject 0.25 mg into the skin once a week.   sertraline (ZOLOFT) 100 MG tablet Take 2 tablets (200 mg total) by mouth every morning after meals   sertraline (ZOLOFT) 100 MG tablet Take 2 tablets (200 mg total) by mouth every morning AFTER MEALS.   sertraline  (ZOLOFT) 100 MG tablet Take 2 tablets (200 mg total) by mouth in the morning AFTER MEALS   triamcinolone ointment (KENALOG) 0.1 % Apply 1 Application topically 2 (two) times daily.   No facility-administered encounter medications on file as of 01/25/2024.      Observations/Objective: Older Caucasian female in NAD  Assessment and Plan: 1. Infected cyst of skin (Primary) Patient to continue applying warm compresses.  Will give a course of Bactrim for 7 days.  She is allergic to penicillins.  Follow-up if this does not resolve or gets worse. - sulfamethoxazole-trimethoprim (BACTRIM DS) 800-160 MG tablet; Take 1 tablet by mouth 2 (two) times daily.  Dispense: 14 tablet; Refill: 0   Follow Up Instructions: As needed   I discussed the assessment and treatment plan with the patient. The patient was provided an opportunity to ask questions and all were answered. The patient agreed with the plan and demonstrated an understanding of the instructions.   The patient was advised to call back or seek an in-person evaluation if the symptoms worsen or if the condition fails to improve as anticipated.  I spent 6 minutes dedicated to the care of this patient on the date of this encounter to include previsit review of of chart, face-to-face time with patient discussing diagnosis and management and post visit entering of orders.  This note has been created with Education officer, environmental. Any transcriptional errors are unintentional.  Jonah Blue, MD

## 2024-01-26 ENCOUNTER — Other Ambulatory Visit: Payer: Self-pay

## 2024-01-28 ENCOUNTER — Ambulatory Visit: Admitting: Allergy

## 2024-02-03 DIAGNOSIS — M545 Low back pain, unspecified: Secondary | ICD-10-CM | POA: Diagnosis not present

## 2024-02-03 NOTE — Progress Notes (Deleted)
 Follow Up Note  RE: Connie Cook MRN: [id number] DOB: [birth year] Date of Office Visit: 02/04/2024  Referring provider: Hoy Register, MD Primary care provider: Hoy Register, MD  Chief Complaint: No chief complaint on file.  History of Present Illness: I had the pleasure of seeing Connie Cook for a follow up visit at the [facility] on 02/03/2024. She is a 63 y.o. female, who is being followed for allergic rhinitis, coughing, wheezing. Her previous allergy office visit was on 11/22/2023 with Dr. Selena Batten. Today is a regular follow up visit.  Discussed the use of AI scribe software for clinical note transcription with the patient, who gave verbal consent to proceed.  History of Present Illness            ***  Assessment and Plan: Connie Cook is a 63 y.o. female with: Other allergic rhinitis Past history - Seasonal symptoms of runny nose and postnasal drip. Current use of Zyrtec and Flonase as needed. Today's skin testing borderline positive to one mold. This does not explain her symptoms.   Use Atrovent (ipratropium) 0.03% 1-2 sprays per nostril twice a day as needed for runny nose/drainage. If no improvement will refer to ENT next.    Chronic coughing Wheezing Past history - persistent coughing and wheezing, exacerbated by cold weather and outdoor activities. History of secondhand smoke exposure. Albuterol inhaler and nebulizer provide minimal relief. 2024 CXR normal. 2024 spirometry showed: No overt abnormalities noted given today's efforts with 1% improvement in FEV1 post bronchodilator treatment. Clinically feeling slightly improved.  Daily controller medication(s): continue Symbicort 2 puffs twice a day with spacer and rinse mouth afterwards. Try for 2 months.  During respiratory infections/flares:  Pretreat with albuterol 2 puffs or albuterol nebulizer.  If you need to use your albuterol nebulizer machine back to back within 15-30 minutes with no  relief then please go to the ER/urgent care for further evaluation.  May use albuterol rescue inhaler 2 puffs or nebulizer every 4 to 6 hours as needed for shortness of breath, chest tightness, coughing, and wheezing. May use albuterol rescue inhaler 2 puffs 5 to 15 minutes prior to strenuous physical activities. Monitor frequency of use - if you need to use it more than twice per week on a consistent basis let us know.  Get spirometry at next visit. Assessment and Plan              No follow-ups on file.  No orders of the defined types were placed in this encounter.  Lab Orders  No laboratory test(s) ordered today    Diagnostics: Spirometry:  Tracings reviewed. Her effort: {Blank single:19197::"Good reproducible efforts.","It was hard to get consistent efforts and there is a question as to whether this reflects a maximal maneuver.","Poor effort, data can not be interpreted."} FVC: ***L FEV1: ***L, ***% predicted FEV1/FVC ratio: ***% Interpretation: {Blank single:19197::"Spirometry consistent with mild obstructive disease","Spirometry consistent with moderate obstructive disease","Spirometry consistent with severe obstructive disease","Spirometry consistent with possible restrictive disease","Spirometry consistent with mixed obstructive and restrictive disease","Spirometry uninterpretable due to technique","Spirometry consistent with normal pattern","No overt abnormalities noted given today's efforts"}.  Please see scanned spirometry results for details.  Skin Testing: {Blank single:19197::"Select foods","Environmental allergy panel","Environmental allergy panel and select foods","Food allergy panel","None","Deferred due to recent antihistamines use"}. *** Results discussed with patient/family.   Medication List:  Current Outpatient Medications  Medication Sig Dispense Refill   albuterol (VENTOLIN HFA) 108 (90 Base) MCG/ACT inhaler Inhale 2 puffs into the lungs every 4 (  four)  hours as needed for wheezing or shortness of breath. 6.7 g 3   ARIPiprazole (ABILIFY) 15 MG tablet Take 1 tablet (15 mg total) by mouth at bedtime. 30 tablet 1   ARIPiprazole (ABILIFY) 15 MG tablet Take 1 tablet (15 mg total) by mouth at bedtime. 30 tablet 1   ARIPiprazole (ABILIFY) 15 MG tablet Take 1 tablet by mouth once a day at bedtime 30 tablet 1   atorvastatin (LIPITOR) 80 MG tablet Take 1 tablet (80 mg total) by mouth daily. 90 tablet 1   Blood Glucose Monitoring Suppl (ONETOUCH VERIO) w/Device KIT USE AS DIRECTED 1 kit 0   budesonide-formoterol (SYMBICORT) 80-4.5 MCG/ACT inhaler Inhale 2 puffs into the lungs 2 (two) times daily with spacer and rinse mouth afterwards. 10.2 g 3   cyanocobalamin (VITAMIN B12) 1000 MCG tablet Take 1 tablet (1,000 mcg total) by mouth daily. 90 tablet 1   gabapentin (NEURONTIN) 300 MG capsule Take 1 capsule (300 mg total) by mouth 3 (three) times daily. 90 capsule 3   glucose blood (ONETOUCH ULTRA BLUE TEST) test strip Use as instructed 100 each 12   hydrOXYzine (ATARAX) 25 MG tablet Take 1 tablet (25 mg total) by mouth 3 (three) times daily as needed. 90 tablet 3   ibuprofen (ADVIL) 200 MG tablet Take 200 mg by mouth every 6 (six) hours as needed.     ipratropium (ATROVENT) 0.03 % nasal spray Place 1-2 sprays into both nostrils 2 (two) times daily as needed (nasal drainage). 30 mL 5   Lancets (ONETOUCH ULTRASOFT) lancets Use as instructed daily 100 each 12   LORazepam (ATIVAN) 1 MG tablet Take 1 mg by mouth daily.     Magnesium 100 MG TABS Take 2-3 tablets (200-300 mg total) by mouth at bedtime. 90 tablet 1   Magnesium Bisglycinate (MAG GLYCINATE) 100 MG TABS Take 2-3 tablets by mouth ONCE A  DAY AT BEDTIME. 90 tablet 1   Magnesium Glycinate 100 MG CAPS Take 2-3 tablets by mouth as bedtime 90 capsule 1   memantine (NAMENDA) 5 MG tablet Take 1 tablet (5 mg total) by mouth 2 (two) times daily. 60 tablet 3   metFORMIN (GLUCOPHAGE) 500 MG tablet Take 1 tablet (500  mg total) by mouth daily with breakfast. 30 tablet 1   nystatin cream (MYCOSTATIN) Apply 1 Application topically 2 (two) times daily. 30 g 2   Semaglutide,0.25 or 0.5MG /DOS, (OZEMPIC, 0.25 OR 0.5 MG/DOSE,) 2 MG/3ML SOPN Inject 0.25 mg into the skin once a week. 3 mL 3   sertraline (ZOLOFT) 100 MG tablet Take 2 tablets (200 mg total) by mouth every morning after meals 60 tablet 1   sertraline (ZOLOFT) 100 MG tablet Take 2 tablets (200 mg total) by mouth every morning AFTER MEALS. 60 tablet 1   sertraline (ZOLOFT) 100 MG tablet Take 2 tablets (200 mg total) by mouth in the morning AFTER MEALS 60 tablet 1   sulfamethoxazole-trimethoprim (BACTRIM DS) 800-160 MG tablet Take 1 tablet by mouth 2 (two) times daily. 14 tablet 0   triamcinolone ointment (KENALOG) 0.1 % Apply 1 Application topically 2 (two) times daily.     No current facility-administered medications for this visit.   Allergies: Allergies  Allergen Reactions   Codeine Shortness Of Breath   Penicillins Rash   Prednisone Other (See Comments)    Has tolerated Depomedrol in past.   Tramadol Itching   I reviewed her past medical history, social history, family history, and environmental history and no significant  changes have been reported from her previous visit.  Review of Systems  Constitutional:  Negative for appetite change, chills, fever and unexpected weight change.  HENT:  Positive for postnasal drip and rhinorrhea. Negative for congestion.   Eyes:  Negative for itching.  Respiratory:  Positive for cough and wheezing. Negative for chest tightness and shortness of breath.   Cardiovascular:  Negative for chest pain.  Gastrointestinal:  Negative for abdominal pain.  Genitourinary:  Negative for difficulty urinating.  Skin:  Positive for rash.  Neurological:  Negative for headaches.    Objective: There were no vitals taken for this visit. There is no height or weight on file to calculate BMI. Physical Exam Vitals and  nursing note reviewed.  Constitutional:      Appearance: Normal appearance. She is well-developed.  HENT:     Head: Normocephalic and atraumatic.     Right Ear: Tympanic membrane and external ear normal.     Left Ear: Tympanic membrane and external ear normal.     Nose: Nose normal.     Mouth/Throat:     Mouth: Mucous membranes are moist.     Pharynx: Oropharynx is clear.  Eyes:     Conjunctiva/sclera: Conjunctivae normal.  Cardiovascular:     Rate and Rhythm: Normal rate and regular rhythm.     Heart sounds: Normal heart sounds. No murmur heard.    No friction rub. No gallop.  Pulmonary:     Effort: Pulmonary effort is normal.     Breath sounds: Normal breath sounds. No wheezing, rhonchi or rales.  Musculoskeletal:     Cervical back: Neck supple.  Skin:    General: Skin is warm.     Findings: No rash.  Neurological:     Mental Status: She is alert and oriented to person, place, and time.  Psychiatric:        Behavior: Behavior normal.    Previous notes and tests were reviewed. The plan was reviewed with the patient/family, and all questions/concerned were addressed.  It was my pleasure to see Connie Cook today and participate in her care. Please feel free to contact me with any questions or concerns.  Sincerely,  Wyline Mood, DO Allergy & Immunology  [facility] office: [phone] [facility] office: [phone]

## 2024-02-04 ENCOUNTER — Ambulatory Visit: Admitting: Allergy

## 2024-02-04 DIAGNOSIS — J3089 Other allergic rhinitis: Secondary | ICD-10-CM

## 2024-02-17 ENCOUNTER — Telehealth: Payer: Self-pay | Admitting: Genetic Counselor

## 2024-02-17 NOTE — Telephone Encounter (Signed)
 Informed patient to contact us to schedule appointments per referral received.

## 2024-02-22 NOTE — Progress Notes (Deleted)
 Follow Up Note  RE: Connie Cook MRN: [id number] DOB: October 05, 1961 Date of Office Visit: 02/23/2024  Referring provider: Hoy Register, MD Primary care provider: Hoy Register, MD  Chief Complaint: No chief complaint on file.  History of Present Illness: I had the pleasure of seeing Connie Cook for a follow up visit at the [facility] on 02/22/2024. She is a 63 y.o. female, who is being followed for allergic rhinitis, coughing, wheezing. Her previous allergy office visit was on 11/22/2023 with Dr. Selena Batten. Today is a regular follow up visit.  Discussed the use of AI scribe software for clinical note transcription with the patient, who gave verbal consent to proceed.  History of Present Illness            ***  Assessment and Plan: Connie Cook is a 63 y.o. female with: Other allergic rhinitis Past history - Seasonal symptoms of runny nose and postnasal drip. Current use of Zyrtec and Flonase as needed. Today's skin testing borderline positive to one mold. This does not explain her symptoms.   Use Atrovent (ipratropium) 0.03% 1-2 sprays per nostril twice a day as needed for runny nose/drainage. If no improvement will refer to ENT next.    Chronic coughing Wheezing Past history - persistent coughing and wheezing, exacerbated by cold weather and outdoor activities. History of secondhand smoke exposure. Albuterol inhaler and nebulizer provide minimal relief. 2024 CXR normal. 2024 spirometry showed: No overt abnormalities noted given today's efforts with 1% improvement in FEV1 post bronchodilator treatment. Clinically feeling slightly improved.  Daily controller medication(s): continue Symbicort 2 puffs twice a day with spacer and rinse mouth afterwards. Try for 2 months.  During respiratory infections/flares:  Pretreat with albuterol 2 puffs or albuterol nebulizer.  If you need to use your albuterol nebulizer machine back to back within 15-30 minutes with no  relief then please go to the ER/urgent care for further evaluation.  May use albuterol rescue inhaler 2 puffs or nebulizer every 4 to 6 hours as needed for shortness of breath, chest tightness, coughing, and wheezing. May use albuterol rescue inhaler 2 puffs 5 to 15 minutes prior to strenuous physical activities. Monitor frequency of use - if you need to use it more than twice per week on a consistent basis let us know.  Get spirometry at next visit. Assessment and Plan              No follow-ups on file.  No orders of the defined types were placed in this encounter.  Lab Orders  No laboratory test(s) ordered today    Diagnostics: Spirometry:  Tracings reviewed. Her effort: {Blank single:19197::"Good reproducible efforts.","It was hard to get consistent efforts and there is a question as to whether this reflects a maximal maneuver.","Poor effort, data can not be interpreted."} FVC: ***L FEV1: ***L, ***% predicted FEV1/FVC ratio: ***% Interpretation: {Blank single:19197::"Spirometry consistent with mild obstructive disease","Spirometry consistent with moderate obstructive disease","Spirometry consistent with severe obstructive disease","Spirometry consistent with possible restrictive disease","Spirometry consistent with mixed obstructive and restrictive disease","Spirometry uninterpretable due to technique","Spirometry consistent with normal pattern","No overt abnormalities noted given today's efforts"}.  Please see scanned spirometry results for details.  Skin Testing: {Blank single:19197::"Select foods","Environmental allergy panel","Environmental allergy panel and select foods","Food allergy panel","None","Deferred due to recent antihistamines use"}. *** Results discussed with patient/family.   Medication List:  Current Outpatient Medications  Medication Sig Dispense Refill   albuterol (VENTOLIN HFA) 108 (90 Base) MCG/ACT inhaler Inhale 2 puffs into the lungs every 4 (  four)  hours as needed for wheezing or shortness of breath. 6.7 g 3   ARIPiprazole (ABILIFY) 15 MG tablet Take 1 tablet (15 mg total) by mouth at bedtime. 30 tablet 1   ARIPiprazole (ABILIFY) 15 MG tablet Take 1 tablet (15 mg total) by mouth at bedtime. 30 tablet 1   ARIPiprazole (ABILIFY) 15 MG tablet Take 1 tablet by mouth once a day at bedtime 30 tablet 1   atorvastatin (LIPITOR) 80 MG tablet Take 1 tablet (80 mg total) by mouth daily. 90 tablet 1   Blood Glucose Monitoring Suppl (ONETOUCH VERIO) w/Device KIT USE AS DIRECTED 1 kit 0   budesonide-formoterol (SYMBICORT) 80-4.5 MCG/ACT inhaler Inhale 2 puffs into the lungs 2 (two) times daily with spacer and rinse mouth afterwards. 10.2 g 3   cyanocobalamin (VITAMIN B12) 1000 MCG tablet Take 1 tablet (1,000 mcg total) by mouth daily. 90 tablet 1   gabapentin (NEURONTIN) 300 MG capsule Take 1 capsule (300 mg total) by mouth 3 (three) times daily. 90 capsule 3   glucose blood (ONETOUCH ULTRA BLUE TEST) test strip Use as instructed 100 each 12   hydrOXYzine (ATARAX) 25 MG tablet Take 1 tablet (25 mg total) by mouth 3 (three) times daily as needed. 90 tablet 3   ibuprofen (ADVIL) 200 MG tablet Take 200 mg by mouth every 6 (six) hours as needed.     ipratropium (ATROVENT) 0.03 % nasal spray Place 1-2 sprays into both nostrils 2 (two) times daily as needed (nasal drainage). 30 mL 5   Lancets (ONETOUCH ULTRASOFT) lancets Use as instructed daily 100 each 12   LORazepam (ATIVAN) 1 MG tablet Take 1 mg by mouth daily.     Magnesium 100 MG TABS Take 2-3 tablets (200-300 mg total) by mouth at bedtime. 90 tablet 1   Magnesium Bisglycinate (MAG GLYCINATE) 100 MG TABS Take 2-3 tablets by mouth ONCE A  DAY AT BEDTIME. 90 tablet 1   Magnesium Glycinate 100 MG CAPS Take 2-3 tablets by mouth as bedtime 90 capsule 1   memantine (NAMENDA) 5 MG tablet Take 1 tablet (5 mg total) by mouth 2 (two) times daily. 60 tablet 3   metFORMIN (GLUCOPHAGE) 500 MG tablet Take 1 tablet (500  mg total) by mouth daily with breakfast. 30 tablet 1   nystatin cream (MYCOSTATIN) Apply 1 Application topically 2 (two) times daily. 30 g 2   Semaglutide,0.25 or 0.5MG /DOS, (OZEMPIC, 0.25 OR 0.5 MG/DOSE,) 2 MG/3ML SOPN Inject 0.25 mg into the skin once a week. 3 mL 3   sertraline (ZOLOFT) 100 MG tablet Take 2 tablets (200 mg total) by mouth every morning after meals 60 tablet 1   sertraline (ZOLOFT) 100 MG tablet Take 2 tablets (200 mg total) by mouth every morning AFTER MEALS. 60 tablet 1   sertraline (ZOLOFT) 100 MG tablet Take 2 tablets (200 mg total) by mouth in the morning AFTER MEALS 60 tablet 1   sulfamethoxazole-trimethoprim (BACTRIM DS) 800-160 MG tablet Take 1 tablet by mouth 2 (two) times daily. 14 tablet 0   triamcinolone ointment (KENALOG) 0.1 % Apply 1 Application topically 2 (two) times daily.     No current facility-administered medications for this visit.   Allergies: Allergies  Allergen Reactions   Codeine Shortness Of Breath   Penicillins Rash   Prednisone Other (See Comments)    Has tolerated Depomedrol in past.   Tramadol Itching   I reviewed her past medical history, social history, family history, and environmental history and no significant  changes have been reported from her previous visit.  Review of Systems  Constitutional:  Negative for appetite change, chills, fever and unexpected weight change.  HENT:  Positive for postnasal drip and rhinorrhea. Negative for congestion.   Eyes:  Negative for itching.  Respiratory:  Positive for cough and wheezing. Negative for chest tightness and shortness of breath.   Cardiovascular:  Negative for chest pain.  Gastrointestinal:  Negative for abdominal pain.  Genitourinary:  Negative for difficulty urinating.  Skin:  Positive for rash.  Neurological:  Negative for headaches.    Objective: There were no vitals taken for this visit. There is no height or weight on file to calculate BMI. Physical Exam Vitals and  nursing note reviewed.  Constitutional:      Appearance: Normal appearance. She is well-developed.  HENT:     Head: Normocephalic and atraumatic.     Right Ear: Tympanic membrane and external ear normal.     Left Ear: Tympanic membrane and external ear normal.     Nose: Nose normal.     Mouth/Throat:     Mouth: Mucous membranes are moist.     Pharynx: Oropharynx is clear.  Eyes:     Conjunctiva/sclera: Conjunctivae normal.  Cardiovascular:     Rate and Rhythm: Normal rate and regular rhythm.     Heart sounds: Normal heart sounds. No murmur heard.    No friction rub. No gallop.  Pulmonary:     Effort: Pulmonary effort is normal.     Breath sounds: Normal breath sounds. No wheezing, rhonchi or rales.  Musculoskeletal:     Cervical back: Neck supple.  Skin:    General: Skin is warm.     Findings: No rash.  Neurological:     Mental Status: She is alert and oriented to person, place, and time.  Psychiatric:        Behavior: Behavior normal.    Previous notes and tests were reviewed. The plan was reviewed with the patient/family, and all questions/concerned were addressed.  It was my pleasure to see Xaniyah today and participate in her care. Please feel free to contact me with any questions or concerns.  Sincerely,  Wyline Mood, DO Allergy & Immunology  [facility] office: [phone] [facility] office: [phone]

## 2024-02-23 ENCOUNTER — Ambulatory Visit: Admitting: Allergy

## 2024-02-24 ENCOUNTER — Telehealth: Payer: Self-pay | Admitting: Genetic Counselor

## 2024-02-24 ENCOUNTER — Inpatient Hospital Stay

## 2024-02-24 ENCOUNTER — Inpatient Hospital Stay: Admitting: Genetic Counselor

## 2024-02-24 NOTE — Telephone Encounter (Signed)
 Patient called to cancel appointments. She said she will call back later to reschedule.

## 2024-03-02 ENCOUNTER — Telehealth: Payer: Self-pay | Admitting: Genetic Counselor

## 2024-03-02 NOTE — Telephone Encounter (Signed)
 I informed Connie Cook to contact us  at [phone] if she would like to reschedule her Genetics appointment.

## 2024-03-03 ENCOUNTER — Telehealth: Payer: Self-pay | Admitting: Genetic Counselor

## 2024-03-03 ENCOUNTER — Other Ambulatory Visit: Payer: Self-pay

## 2024-03-03 MED ORDER — METHOCARBAMOL 750 MG PO TABS
750.0000 mg | ORAL_TABLET | Freq: Two times a day (BID) | ORAL | 2 refills | Status: AC
  Filled 2024-03-03: qty 20, 10d supply, fill #0
  Filled 2024-03-03: qty 20, 7d supply, fill #0
  Filled 2024-03-10: qty 20, 10d supply, fill #0
  Filled 2024-07-04 (×2): qty 20, 10d supply, fill #1

## 2024-03-03 MED ORDER — LORAZEPAM 1 MG PO TABS
1.0000 mg | ORAL_TABLET | Freq: Every evening | ORAL | 1 refills | Status: AC
Start: 2024-01-26 — End: ?
  Filled 2024-03-03: qty 30, 30d supply, fill #0

## 2024-03-03 MED ORDER — ARIPIPRAZOLE 15 MG PO TABS
15.0000 mg | ORAL_TABLET | Freq: Every evening | ORAL | 2 refills | Status: DC
  Filled 2024-03-03 – 2024-03-10 (×2): qty 30, 30d supply, fill #0
  Filled 2024-07-04: qty 30, 30d supply, fill #1

## 2024-03-03 NOTE — Telephone Encounter (Signed)
 Informed patient to contact us to schedule appointments per referral received.

## 2024-03-09 ENCOUNTER — Other Ambulatory Visit: Payer: Self-pay

## 2024-03-09 DIAGNOSIS — F331 Major depressive disorder, recurrent, moderate: Secondary | ICD-10-CM | POA: Diagnosis not present

## 2024-03-09 DIAGNOSIS — F410 Panic disorder [episodic paroxysmal anxiety] without agoraphobia: Secondary | ICD-10-CM | POA: Diagnosis not present

## 2024-03-09 DIAGNOSIS — F4312 Post-traumatic stress disorder, chronic: Secondary | ICD-10-CM | POA: Diagnosis not present

## 2024-03-09 DIAGNOSIS — F411 Generalized anxiety disorder: Secondary | ICD-10-CM | POA: Diagnosis not present

## 2024-03-09 MED ORDER — SERTRALINE HCL 100 MG PO TABS
200.0000 mg | ORAL_TABLET | Freq: Every morning | ORAL | 1 refills | Status: DC
  Filled 2024-03-09 – 2024-03-10 (×2): qty 60, 30d supply, fill #0
  Filled 2024-07-04 (×2): qty 60, 30d supply, fill #1

## 2024-03-09 MED ORDER — ARIPIPRAZOLE 15 MG PO TABS
15.0000 mg | ORAL_TABLET | Freq: Every evening | ORAL | 1 refills | Status: DC
  Filled 2024-03-09 – 2024-07-30 (×3): qty 30, 30d supply, fill #0

## 2024-03-09 MED ORDER — MAGNESIUM GLYCINATE 100 MG PO CAPS
2.0000 | ORAL_CAPSULE | Freq: Every evening | ORAL | 1 refills | Status: DC
Start: 2024-03-09 — End: 2024-07-31
  Filled 2024-07-30: qty 90, fill #0

## 2024-03-10 ENCOUNTER — Other Ambulatory Visit: Payer: Self-pay

## 2024-03-10 ENCOUNTER — Other Ambulatory Visit (HOSPITAL_COMMUNITY): Payer: Self-pay

## 2024-03-10 ENCOUNTER — Telehealth: Payer: Self-pay | Admitting: Physical Medicine and Rehabilitation

## 2024-03-10 NOTE — Telephone Encounter (Signed)
 Patient called and wanted to get scheduled for back injection. [phone]

## 2024-03-21 ENCOUNTER — Ambulatory Visit: Admitting: Physical Medicine and Rehabilitation

## 2024-03-21 ENCOUNTER — Encounter: Payer: Self-pay | Admitting: Physical Medicine and Rehabilitation

## 2024-03-21 DIAGNOSIS — M5442 Lumbago with sciatica, left side: Secondary | ICD-10-CM

## 2024-03-21 DIAGNOSIS — M5441 Lumbago with sciatica, right side: Secondary | ICD-10-CM | POA: Diagnosis not present

## 2024-03-21 DIAGNOSIS — M5416 Radiculopathy, lumbar region: Secondary | ICD-10-CM | POA: Diagnosis not present

## 2024-03-21 DIAGNOSIS — G8929 Other chronic pain: Secondary | ICD-10-CM | POA: Diagnosis not present

## 2024-03-21 DIAGNOSIS — M47816 Spondylosis without myelopathy or radiculopathy, lumbar region: Secondary | ICD-10-CM

## 2024-03-21 NOTE — Progress Notes (Signed)
 Core Outcome Measures Index (COMI) Back Score  Average Pain 7  COMI Score 50%

## 2024-03-21 NOTE — Progress Notes (Signed)
 Connie Cook - 63 y.o. female MRN [id number]  Date of birth: February 16, 1961  Office Visit Note: Visit Date: 03/21/2024 PCP: Joaquin Mulberry, MD Referred by: Joaquin Mulberry, MD  Subjective: Chief Complaint  Patient presents with   Lower Back - Pain   HPI: Connie Cook is a 63 y.o. female who comes in today as a self referral for evaluation of chronic, worsening and severe bilateral lower back pain radiating to buttocks and hips.. Pain ongoing for several years, worsened about 6 months ago. Her pain becomes severe with prolonged standing and activity. Also reports pain with twisting motions. She describes pain as weak and throbbing sensation, currently rates as 5 out of 10. Some relief of pain with home exercise regimen, rest and use of medications. History of formal physical therapy with minimal relief of pain. Lumbar radiographs from 2024 show advanced multi level degenerative changes, no spondylolisthesis, no pars defects. No recent MRI imaging of lumbar spine. History of lumbar injections with Dr. Adelaide Adjutant in 2013/2014, reports good relief of pain with these injections. Patient denies focal weakness, numbness and tingling. No recent trauma or falls.   Patients course is complicated by diabetes mellitus, psoriatic arthritis, depression and anxiety.      Review of Systems  Musculoskeletal:  Positive for back pain.  Neurological:  Negative for tingling, sensory change, focal weakness and weakness.  All other systems reviewed and are negative.  Otherwise per HPI.  Assessment & Plan: Visit Diagnoses:    ICD-10-CM   1. Chronic bilateral low back pain with bilateral sciatica  M54.42 MR LUMBAR SPINE WO CONTRAST   M54.41    G89.29     2. Radiculopathy, lumbar region  M54.16 MR LUMBAR SPINE WO CONTRAST    3. Facet arthropathy, lumbar  M47.816 MR LUMBAR SPINE WO CONTRAST       Plan: Findings:  Chronic, worsening and severe bilateral lower back pain radiating to  buttocks and hips. Patient continues to have severe pain despite good conservative therapies such as formal physical therapy, home exercise regimen, rest and use of medications. Patients clinical presentation and exam are complex, differentials include lumbar radiculopathy vs facet mediated pain. She does have pain with lumbar extension today. We discussed treatment plan in detail today. Next step is to place order for lumbar MRI imaging. Depending on results of MRI imaging we discussed possibility of performing lumbar injections. I will see her back for lumbar MRI review and to discuss further treatment options. She has no questions at this time. I would like for her to remain active as tolerated. No red flag symptoms noted upon exam today.     Meds & Orders: No orders of the defined types were placed in this encounter.   Orders Placed This Encounter  Procedures   MR LUMBAR SPINE WO CONTRAST    Follow-up: Return for Lumbar MRI review.   Procedures: No procedures performed      Clinical History: No specialty comments available.   She reports that she has never smoked. She has been exposed to tobacco smoke. She has never used smokeless tobacco.  Recent Labs    03/31/23 1356 10/04/23 1429  HGBA1C 6.0* 6.8*    Objective:  VS:  HT:    WT:   BMI:     BP:   HR: bpm  TEMP: ( )  RESP:  Physical Exam Vitals and nursing note reviewed.  HENT:     Head: Normocephalic and atraumatic.     Right Ear:  External ear normal.     Left Ear: External ear normal.     Nose: Nose normal.     Mouth/Throat:     Mouth: Mucous membranes are moist.  Eyes:     Extraocular Movements: Extraocular movements intact.  Cardiovascular:     Rate and Rhythm: Normal rate.     Pulses: Normal pulses.  Pulmonary:     Effort: Pulmonary effort is normal.  Abdominal:     General: Abdomen is flat. There is no distension.  Musculoskeletal:        General: Tenderness present.     Cervical back: Normal range of  motion.     Comments: Patient is slow to rise from seated position to standing. Mild pain noted with facet loading and lumbar extension. 5/5 strength noted with bilateral hip flexion, knee flexion/extension, ankle dorsiflexion/plantarflexion and EHL. No clonus noted bilaterally. No pain upon palpation of greater trochanters. No pain with internal/external rotation of bilateral hips. Sensation intact bilaterally. Negative slump test bilaterally. Ambulates without aid, gait steady.     Skin:    General: Skin is warm and dry.     Capillary Refill: Capillary refill takes less than 2 seconds.  Neurological:     General: No focal deficit present.     Mental Status: She is alert and oriented to person, place, and time.  Psychiatric:        Mood and Affect: Mood normal.        Behavior: Behavior normal.     Ortho Exam  Imaging: No results found.  Past Medical/Family/Surgical/Social History: Medications & Allergies reviewed per EMR, new medications updated. Patient Active Problem List   Diagnosis Date Noted   Memory loss of unknown cause 01/11/2024   Pure hypercholesterolemia 06/26/2022   Type 2 diabetes mellitus (HCC) 06/26/2022   Tendinopathy of left rotator cuff 06/25/2022   Weight gain 04/29/2022   PTSD (post-traumatic stress disorder) 01/06/2021   Generalized anxiety disorder 01/06/2021   Major depressive disorder, recurrent episode, severe (HCC) 11/22/2020   Past Medical History:  Diagnosis Date   Anxiety    Arthritis    DDD (degenerative disc disease), cervical    Depression    H/O adenoidectomy    History of removal of ovarian cyst    Hyperlipidemia    Family History  Problem Relation Age of Onset   Pancreatic cancer Mother    Allergic rhinitis Mother    Depression Mother    Lung cancer Father        Smoker   Arthritis Father        Psoriatic arthritis   Parkinson's disease Father    Lung cancer Maternal Uncle    Congestive Heart Failure Paternal Grandmother     Transient ischemic attack Maternal Grandmother    Liver disease Paternal Grandfather    Anxiety disorder Niece    Depression Niece    ADD / ADHD Nephew    Colon cancer Maternal Uncle    Stomach cancer Maternal Uncle    Heart disease Other    Throat cancer Other        Smoker   Bladder Cancer Paternal Uncle    Past Surgical History:  Procedure Laterality Date   ADENOIDECTOMY     APPENDECTOMY     TONSILLECTOMY     Social History   Occupational History   Not on file  Tobacco Use   Smoking status: Never    Passive exposure: Past (Both parent)   Smokeless tobacco: Never  Vaping  Use   Vaping status: Never Used  Substance and Sexual Activity   Alcohol use: Never   Drug use: Never   Sexual activity: Not on file

## 2024-03-21 NOTE — Progress Notes (Signed)
 Pain Scale   Average Pain 6 Patient advising her pain in her lower back is constant and she doesn't get relief.        +Driver, -BT, -Dye Allergies.

## 2024-03-28 ENCOUNTER — Ambulatory Visit
Admission: RE | Admit: 2024-03-28 | Discharge: 2024-03-28 | Disposition: A | Discharge status: Home / Self Care | Source: Ambulatory Visit | Attending: Physical Medicine and Rehabilitation | Admitting: Physical Medicine and Rehabilitation

## 2024-03-28 DIAGNOSIS — M47816 Spondylosis without myelopathy or radiculopathy, lumbar region: Secondary | ICD-10-CM

## 2024-03-28 DIAGNOSIS — M5416 Radiculopathy, lumbar region: Secondary | ICD-10-CM

## 2024-03-28 DIAGNOSIS — G8929 Other chronic pain: Secondary | ICD-10-CM

## 2024-03-30 ENCOUNTER — Encounter: Payer: Medicare Other | Admitting: Family Medicine

## 2024-04-05 ENCOUNTER — Other Ambulatory Visit: Payer: Self-pay

## 2024-04-05 ENCOUNTER — Encounter: Payer: Self-pay | Admitting: Family Medicine

## 2024-04-05 ENCOUNTER — Ambulatory Visit: Payer: Medicare Other | Attending: Family Medicine | Admitting: Family Medicine

## 2024-04-05 VITALS — BP 119/80 | HR 88 | Temp 98.1°F | Ht 62.0 in | Wt 228.0 lb

## 2024-04-05 DIAGNOSIS — Z23 Encounter for immunization: Secondary | ICD-10-CM | POA: Diagnosis not present

## 2024-04-05 DIAGNOSIS — Z1211 Encounter for screening for malignant neoplasm of colon: Secondary | ICD-10-CM

## 2024-04-05 DIAGNOSIS — Z6841 Body Mass Index (BMI) 40.0 and over, adult: Secondary | ICD-10-CM

## 2024-04-05 DIAGNOSIS — Z0000 Encounter for general adult medical examination without abnormal findings: Secondary | ICD-10-CM

## 2024-04-05 NOTE — Patient Instructions (Signed)
  Connie Cook , Thank you for taking time to come for your Medicare Wellness Visit. I appreciate your ongoing commitment to your health goals. Please review the following plan we discussed and let me know if I can assist you in the future.   These are the goals we discussed:  Goals      Set My Weight Loss Goal     Follow Up Date 5//21/2025    - set weight loss goal    Why is this important?   Losing only 5 to 15 percent of your weight makes a big difference in your health.    Notes: Would like to try Ozepmic to start Weight loss        This is a list of the screening recommended for you and due dates:  Health Maintenance  Topic Date Due   Eye exam for diabetics  Never done   Yearly kidney health urinalysis for diabetes  Never done   Pneumococcal Vaccination (1 of 2 - PCV) Never done   Colon Cancer Screening  Never done   Zoster (Shingles) Vaccine (2 of 2) 05/25/2023   COVID-19 Vaccine (4 - 2024-25 season) 07/18/2023   Yearly kidney function blood test for diabetes  03/30/2024   Hemoglobin A1C  04/02/2024   Flu Shot  06/16/2024   Complete foot exam   04/05/2025   Medicare Annual Wellness Visit  04/05/2025   Mammogram  06/21/2025   Pap with HPV screening  03/29/2028   DTaP/Tdap/Td vaccine (3 - Td or Tdap) 05/23/2033   Hepatitis C Screening  Completed   HIV Screening  Completed   HPV Vaccine  Aged Out   Meningitis B Vaccine  Aged Out

## 2024-04-05 NOTE — Progress Notes (Signed)
 Subjective:    Connie Cook is a 63 y.o. female who presents for a Welcome to Medicare exam.   Cardiac Risk Factors include: diabetes mellitus;sedentary lifestyle     Since her last visit with me she has been evaluated by neurology and genetics due to increased risk of Alzheimer's.  She is currently on Namenda .  Sister is wondering if a history of a premature birth and the fact that she struggled through school could be contributing. Her weight also continues to be a concern and she is not able to exercise due to back pain. Accompanied by sister to this visit.  Objective:     Today's Vitals   04/05/24 1332  BP: 119/80  Pulse: 88  Temp: 98.1 F (36.7 C)  TempSrc: Oral  SpO2: 92%  Weight: 228 lb (103.4 kg)  Height: 5\' 2"  (1.575 m)  PainSc: 6   Body mass index is 41.7 kg/m.  Medications Outpatient Encounter Medications as of 04/05/2024  Medication Sig   albuterol  (VENTOLIN  HFA) 108 (90 Base) MCG/ACT inhaler Inhale 2 puffs into the lungs every 4 (four) hours as needed for wheezing or shortness of breath.   ARIPiprazole  (ABILIFY ) 15 MG tablet Take 1 tablet (15 mg total) by mouth at bedtime.   ARIPiprazole  (ABILIFY ) 15 MG tablet Take 1 tablet (15 mg total) by mouth at bedtime.   ARIPiprazole  (ABILIFY ) 15 MG tablet Take 1 tablet by mouth once a day at bedtime   ARIPiprazole  (ABILIFY ) 15 MG tablet Take 1 tablet (15 mg total) by mouth at bedtime.   ARIPiprazole  (ABILIFY ) 15 MG tablet Take 1 tablet (15 mg total) by mouth at bedtime.   atorvastatin  (LIPITOR) 80 MG tablet Take 1 tablet (80 mg total) by mouth daily.   Blood Glucose Monitoring Suppl (ONETOUCH VERIO) w/Device KIT USE AS DIRECTED   budesonide -formoterol  (SYMBICORT ) 80-4.5 MCG/ACT inhaler Inhale 2 puffs into the lungs 2 (two) times daily with spacer and rinse mouth afterwards.   cyanocobalamin  (VITAMIN B12) 1000 MCG tablet Take 1 tablet (1,000 mcg total) by mouth daily.   gabapentin  (NEURONTIN ) 300 MG capsule Take  1 capsule (300 mg total) by mouth 3 (three) times daily.   glucose blood (ONETOUCH ULTRA BLUE TEST) test strip Use as instructed   hydrOXYzine  (ATARAX ) 25 MG tablet Take 1 tablet (25 mg total) by mouth 3 (three) times daily as needed.   ibuprofen (ADVIL) 200 MG tablet Take 200 mg by mouth every 6 (six) hours as needed.   ipratropium (ATROVENT ) 0.03 % nasal spray Place 1-2 sprays into both nostrils 2 (two) times daily as needed (nasal drainage).   Lancets (ONETOUCH ULTRASOFT) lancets Use as instructed daily   LORazepam  (ATIVAN ) 1 MG tablet Take 1 mg by mouth daily.   LORazepam  (ATIVAN ) 1 MG tablet Take 1/2-1 tablet (1 mg total) by mouth at bedtime as needed.   Magnesium  100 MG TABS Take 2-3 tablets (200-300 mg total) by mouth at bedtime.   Magnesium  Bisglycinate (MAG GLYCINATE) 100 MG TABS Take 2-3 tablets by mouth ONCE A  DAY AT BEDTIME.   Magnesium  Glycinate 100 MG CAPS Take 2-3 tablets by mouth as bedtime   Magnesium  Glycinate 100 MG CAPS Take 2-3 capsules by mouth at bedtime.   memantine  (NAMENDA ) 5 MG tablet Take 1 tablet (5 mg total) by mouth 2 (two) times daily.   metFORMIN  (GLUCOPHAGE ) 500 MG tablet Take 1 tablet (500 mg total) by mouth daily with breakfast.   methocarbamol  (ROBAXIN ) 750 MG tablet Take 1 tablet (  750 mg total) by mouth 2 (two) times daily as needed for musce spasms.   nystatin  cream (MYCOSTATIN ) Apply 1 Application topically 2 (two) times daily.   sertraline  (ZOLOFT ) 100 MG tablet Take 2 tablets (200 mg total) by mouth every morning after meals   sertraline  (ZOLOFT ) 100 MG tablet Take 2 tablets (200 mg total) by mouth every morning AFTER MEALS.   sertraline  (ZOLOFT ) 100 MG tablet Take 2 tablets (200 mg total) by mouth in the morning AFTER MEALS   sertraline  (ZOLOFT ) 100 MG tablet Take 2 tablets (200 mg total) by mouth in the morning after a meal   sulfamethoxazole -trimethoprim  (BACTRIM  DS) 800-160 MG tablet Take 1 tablet by mouth 2 (two) times daily.   triamcinolone  ointment (KENALOG) 0.1 % Apply 1 Application topically 2 (two) times daily.   Semaglutide ,0.25 or 0.5MG /DOS, (OZEMPIC , 0.25 OR 0.5 MG/DOSE,) 2 MG/3ML SOPN Inject 0.25 mg into the skin once a week. (Patient not taking: Reported on 04/05/2024)   No facility-administered encounter medications on file as of 04/05/2024.     History: Past Medical History:  Diagnosis Date   Allergy  07/23/1988   Anxiety    Arthritis    Asthma 07/18/2005   DDD (degenerative disc disease), cervical    Depression    H/O adenoidectomy    History of removal of ovarian cyst    Hyperlipidemia    Sleep apnea 2010   Past Surgical History:  Procedure Laterality Date   ADENOIDECTOMY     APPENDECTOMY     TONSILLECTOMY      Family History  Problem Relation Age of Onset   Pancreatic cancer Mother    Allergic rhinitis Mother    Depression Mother    Cancer Mother    COPD Mother    Hyperlipidemia Mother    Hypertension Mother    Lung cancer Father        Smoker   Arthritis Father        Psoriatic arthritis   Parkinson's disease Father    Alcohol abuse Father    Cancer Father    COPD Father    Depression Father    Hearing loss Father    Hyperlipidemia Father    Lung cancer Maternal Uncle    Cancer Maternal Uncle    Congestive Heart Failure Paternal Grandmother    Arthritis Paternal Grandmother    Transient ischemic attack Maternal Grandmother    Diabetes Maternal Grandmother    Stroke Maternal Grandmother    Liver disease Paternal Grandfather    Anxiety disorder Niece    Depression Niece    ADD / ADHD Nephew    Colon cancer Maternal Uncle    Stomach cancer Maternal Uncle    Heart disease Other    Throat cancer Other        Smoker   Bladder Cancer Paternal Uncle    Alcohol abuse Paternal Uncle    Cancer Paternal Uncle    Depression Paternal Uncle    Cancer Maternal Uncle    Early death Maternal Uncle    Hyperlipidemia Sister    Hypertension Sister    Kidney disease Maternal Aunt    Social  History   Occupational History   Not on file  Tobacco Use   Smoking status: Never    Passive exposure: Past (Both parent)   Smokeless tobacco: Never  Vaping Use   Vaping status: Never Used  Substance and Sexual Activity   Alcohol use: Never   Drug use: Never   Sexual activity:  Not Currently    Birth control/protection: Post-menopausal    Tobacco Counseling Counseling given: Not Answered   Immunizations and Health Maintenance Immunization History  Administered Date(s) Administered   Influenza, Seasonal, Injecte, Preservative Fre 10/04/2023   Influenza,inj,Quad PF,6+ Mos 09/30/2022   PFIZER(Purple Top)SARS-COV-2 Vaccination 01/29/2020, 02/20/2020, 06/17/2020   Tdap 10/03/2015, 05/24/2023   Zoster Recombinant(Shingrix ) 03/30/2023   Health Maintenance Due  Topic Date Due   Medicare Annual Wellness (AWV)  Never done   FOOT EXAM  Never done   OPHTHALMOLOGY EXAM  Never done   Diabetic kidney evaluation - Urine ACR  Never done   Pneumococcal Vaccine 50-21 Years old (1 of 2 - PCV) Never done   Colonoscopy  Never done   Zoster Vaccines- Shingrix  (2 of 2) 05/25/2023   COVID-19 Vaccine (4 - 2024-25 season) 07/18/2023   Diabetic kidney evaluation - eGFR measurement  03/30/2024   HEMOGLOBIN A1C  04/02/2024    Activities of Daily Living    04/05/2024    1:34 PM  In your present state of health, do you have any difficulty performing the following activities:  Hearing? 0  Vision? 1  Comment Sometimes a little blurry. Current glasses are nnot prescription  Difficulty concentrating or making decisions? 1  Comment Sometimes  Walking or climbing stairs? 1  Dressing or bathing? 1  Comment Bathing is hard due to back pain  Doing errands, shopping? 1  Preparing Food and eating ? Y  Using the Toilet? N  In the past six months, have you accidently leaked urine? Y  Do you have problems with loss of bowel control? N  Managing your Medications? Y  Comment Patient has pill organizer  at home  Managing your Finances? Y  Comment Sister is helping manage finances  Housekeeping or managing your Housekeeping? Y  Comment Fmaily available to assist    Physical Exam   Physical Exam (optional), or other factors deemed appropriate based on the beneficiary's medical and social history and current clinical standards. Constitutional: normal appearing, obese Eyes: PERRLA HEENT: Head is atraumatic, normal sinuses, normal oropharynx, normal appearing tonsils and palate, tympanic membrane is normal bilaterally. Neck: normal range of motion, no thyromegaly, no JVD Cardiovascular: normal rate and rhythm, normal heart sounds, no murmurs, rub or gallop, no pedal edema Respiratory: Normal breath sounds, clear to auscultation bilaterally, no wheezes, no rales, no rhonchi Abdomen: soft, not tender to palpation, normal bowel sounds, no enlarged organs Musculoskeletal: Full ROM, no tenderness in joints Skin: warm and dry, no lesions. Neurological: alert, oriented x3, cranial nerves I-XII grossly intact , normal motor strength, normal sensation. Psychological: normal mood.   Advanced Directives: Does Patient Have a Medical Advance Directive?: No Would patient like information on creating a medical advance directive?: Yes (MAU/Ambulatory/Procedural Areas - Information given)     Assessment:     This is a routine wellness examination for this patient .   Vision/Hearing screen Vision Screening   Right eye Left eye Both eyes  Without correction 20/25 20/40 20/20   With correction        Goals      Set My Weight Loss Goal     Follow Up Date 5//21/2025    - set weight loss goal    Why is this important?   Losing only 5 to 15 percent of your weight makes a big difference in your health.    Notes: Would like to try Ozepmic to start Weight loss        Depression Screen  04/05/2024    1:40 PM 11/16/2023   10:22 AM 10/04/2023    2:00 PM 10/04/2023    1:49 PM  PHQ 2/9  Scores  PHQ - 2 Score 2 4    PHQ- 9 Score 11 18    Exception Documentation   Patient refusal Patient refusal     Fall Risk    04/05/2024    1:34 PM  Fall Risk   Falls in the past year? 0  Number falls in past yr: 0  Injury with Fall? 0  Risk for fall due to : No Fall Risks  Follow up Falls evaluation completed    Cognitive Function:    04/05/2024    1:40 PM 11/16/2023   10:23 AM  MMSE - Mini Mental State Exam  Orientation to time 3 4  Orientation to Place 5 5  Registration 3 3  Attention/ Calculation 5 5  Recall 2 2  Language- name 2 objects 2 2  Language- repeat 1 1  Language- follow 3 step command 2 3  Language- read & follow direction 1 1  Write a sentence 1 1  Copy design 1 1  Total score 26 28        Patient Care Team: Joaquin Mulberry, MD as PCP - General (Family Medicine)     Plan:   1. Encounter for Medicare annual wellness exam (Primary) Counseled on 150 minutes of exercise per week, healthy eating (including decreased daily intake of saturated fats, cholesterol, added sugars, sodium), routine healthcare maintenance.  - Ambulatory referral to Gastroenterology (for Colonoscopy) - Ambulatory referral to Ophthalmology  2. Screening for colon cancer Referral to gastroenterology placed  3. Morbid obesity (HCC) Unfortunately due to back pain she is unable to exercise She will be an ideal candidate for Ozempic  but I have communicated with the pharmacy technician and Ozempic  was denied by her insurance company.  Pharmacy will submit a prior authorization again The fact that she is on Abilify  also contributes to her weight gain   I have personally reviewed and noted the following in the patient's chart:   Medical and social history Use of alcohol, tobacco or illicit drugs  Current medications and supplements including opioid prescriptions. Patient is not currently taking opioid prescriptions. Functional ability and status Nutritional status Physical  activity Advanced directives List of other physicians Hospitalizations, surgeries, and ER visits in previous 12 months Vitals Screenings to include cognitive, depression, and falls Referrals and appointments  In addition, I have reviewed and discussed with patient certain preventive protocols, quality metrics, and best practice recommendations. A written personalized care plan for preventive services as well as general preventive health recommendations were provided to patient.   Return in about 1 month (around 05/06/2024) for Chronic medical conditions.   Joaquin Mulberry, MD 04/05/2024

## 2024-05-01 ENCOUNTER — Telehealth: Payer: Self-pay | Admitting: Physical Medicine and Rehabilitation

## 2024-05-01 ENCOUNTER — Ambulatory Visit: Admitting: Physical Medicine and Rehabilitation

## 2024-05-01 NOTE — Telephone Encounter (Signed)
 Pt called requesting MRI reading over the phone. Pt states her dog is sick and she cant come today. Pleased call pt at [phone].

## 2024-05-02 ENCOUNTER — Other Ambulatory Visit: Payer: Self-pay | Admitting: Physical Medicine and Rehabilitation

## 2024-05-02 DIAGNOSIS — M5416 Radiculopathy, lumbar region: Secondary | ICD-10-CM

## 2024-05-02 MED ORDER — HYDROCODONE-ACETAMINOPHEN 5-325 MG PO TABS: 1.0000 | ORAL_TABLET | Freq: Three times a day (TID) | ORAL | 0 refills | Status: AC | PRN

## 2024-05-03 ENCOUNTER — Other Ambulatory Visit: Payer: Self-pay

## 2024-05-03 DIAGNOSIS — F411 Generalized anxiety disorder: Secondary | ICD-10-CM | POA: Diagnosis not present

## 2024-05-03 DIAGNOSIS — F4312 Post-traumatic stress disorder, chronic: Secondary | ICD-10-CM | POA: Diagnosis not present

## 2024-05-03 DIAGNOSIS — F410 Panic disorder [episodic paroxysmal anxiety] without agoraphobia: Secondary | ICD-10-CM | POA: Diagnosis not present

## 2024-05-03 DIAGNOSIS — F331 Major depressive disorder, recurrent, moderate: Secondary | ICD-10-CM | POA: Diagnosis not present

## 2024-05-03 MED ORDER — SERTRALINE HCL 100 MG PO TABS
ORAL_TABLET | ORAL | 1 refills | Status: DC
  Filled 2024-07-31: qty 60, 30d supply, fill #0
  Filled 2024-09-13: qty 60, 30d supply, fill #1

## 2024-05-03 MED ORDER — MAG GLYCINATE 100 MG PO TABS
200.0000 mg | ORAL_TABLET | Freq: Every day | ORAL | 1 refills | Status: DC
  Filled 2024-07-30: qty 90, fill #0

## 2024-05-03 MED ORDER — ARIPIPRAZOLE 15 MG PO TABS
ORAL_TABLET | ORAL | 1 refills | Status: AC
  Filled 2024-07-31: qty 30, 30d supply, fill #0
  Filled 2024-12-12: qty 30, 30d supply, fill #1

## 2024-05-15 ENCOUNTER — Ambulatory Visit: Admitting: Family Medicine

## 2024-05-17 ENCOUNTER — Other Ambulatory Visit: Payer: Self-pay

## 2024-05-17 ENCOUNTER — Other Ambulatory Visit (HOSPITAL_COMMUNITY): Payer: Self-pay

## 2024-05-17 ENCOUNTER — Other Ambulatory Visit: Payer: Self-pay | Admitting: Family Medicine

## 2024-05-17 MED ORDER — METFORMIN HCL 500 MG PO TABS
500.0000 mg | ORAL_TABLET | Freq: Every day | ORAL | 0 refills | Status: DC
  Filled 2024-05-17: qty 30, 30d supply, fill #0

## 2024-05-18 ENCOUNTER — Other Ambulatory Visit: Payer: Self-pay

## 2024-05-18 ENCOUNTER — Ambulatory Visit: Admitting: Physical Medicine and Rehabilitation

## 2024-05-18 VITALS — BP 143/88 | HR 99

## 2024-05-18 DIAGNOSIS — M5416 Radiculopathy, lumbar region: Secondary | ICD-10-CM

## 2024-05-18 MED ORDER — METHYLPREDNISOLONE ACETATE 40 MG/ML IJ SUSP
40.0000 mg | Freq: Once | INTRAMUSCULAR | Status: AC
  Administered 2024-05-18: 40 mg

## 2024-05-18 NOTE — Patient Instructions (Signed)

## 2024-05-18 NOTE — Progress Notes (Signed)
 Connie Cook - 63 y.o. female MRN [id number]  Date of birth: [birth year]  Office Visit Note: Visit Date: 05/18/2024 PCP: Delbert Clam, MD Referred by: Delbert Clam, MD  Subjective: Chief Complaint  Patient presents with   Lower Back - Pain   HPI:  Connie Cook is a 63 y.o. female who comes in today at the request of Duwaine Pouch, FNP for planned Left L5-S1 Lumbar Interlaminar epidural steroid injection with fluoroscopic guidance.  The patient has failed conservative care including home exercise, medications, time and activity modification.  This injection will be diagnostic and hopefully therapeutic.  Please see requesting physician notes for further details and justification.   ROS Otherwise per HPI.  Assessment & Plan: Visit Diagnoses:    ICD-10-CM   1. Lumbar radiculopathy  M54.16 XR C-ARM NO REPORT    Epidural Steroid injection    methylPREDNISolone  acetate (DEPO-MEDROL ) injection 40 mg      Plan: No additional findings.   Meds & Orders:  Meds ordered this encounter  Medications   methylPREDNISolone  acetate (DEPO-MEDROL ) injection 40 mg    Orders Placed This Encounter  Procedures   XR C-ARM NO REPORT   Epidural Steroid injection    Follow-up: Return if symptoms worsen or fail to improve.   Procedures: No procedures performed  Lumbar Epidural Steroid Injection - Interlaminar Approach with Fluoroscopic Guidance  Patient: Connie Cook      Date of Birth: 1961-09-14 MRN: [id number] PCP: Delbert Clam, MD      Visit Date: 05/18/2024   Universal Protocol:     Consent Given By: the patient  Position: PRONE  Additional Comments: Vital signs were monitored before and after the procedure. Patient was prepped and draped in the usual sterile fashion. The correct patient, procedure, and site was verified.   Injection Procedure Details:   Procedure diagnoses: Lumbar radiculopathy [M54.16]   Meds Administered:  Meds ordered  this encounter  Medications   methylPREDNISolone  acetate (DEPO-MEDROL ) injection 40 mg     Laterality: Left  Location/Site:  L5-S1  Needle: 4.5 in., 20 ga. Tuohy  Needle Placement: Paramedian epidural  Findings:   -Comments: Excellent flow of contrast into the epidural space.  Procedure Details: Using a paramedian approach from the side mentioned above, the region overlying the inferior lamina was localized under fluoroscopic visualization and the soft tissues overlying this structure were infiltrated with 4 ml. of 1% Lidocaine  without Epinephrine. The Tuohy needle was inserted into the epidural space using a paramedian approach.   The epidural space was localized using loss of resistance along with counter oblique bi-planar fluoroscopic views.  After negative aspirate for air, blood, and CSF, a 2 ml. volume of Isovue-250 was injected into the epidural space and the flow of contrast was observed. Radiographs were obtained for documentation purposes.    The injectate was administered into the level noted above.   Additional Comments:  The patient tolerated the procedure well Dressing: 2 x 2 sterile gauze and Band-Aid    Post-procedure details: Patient was observed during the procedure. Post-procedure instructions were reviewed.  Patient left the clinic in stable condition.   Clinical History: CLINICAL DATA:  Low back pain   EXAM: MRI LUMBAR SPINE WITHOUT CONTRAST   TECHNIQUE: Multiplanar, multisequence MR imaging of the lumbar spine was performed. No intravenous contrast was administered.   COMPARISON:  None Available.   FINDINGS: Segmentation: Standard.   Alignment:  Physiologic lumbar alignment is maintained.   Vertebrae: Schmorl's nodes at a few levels.  Vertebral bodies demonstrate normal signal intensity. No compression fractures.   Conus medullaris and cauda equina: The conus medullaris terminates at the level of L1-L2. The distal spinal cord signal  intensity is normal. Congenital narrowing of the canal.   Paraspinal and other soft tissues: The visualized abdomen and pelvis show no soft tissue abnormality. The visualized aorta is normal.   Disc levels:   L1-L2: Disc is normal in configuration. Moderate bilateral facet arthropathy. No neuroforaminal stenosis. No spinal canal stenosis.   L2-L3: Disc bulge. Moderate bilateral facet arthropathy. No neuroforaminal stenosis. Mild spinal canal stenosis.   L3-L4: Disc bulge. Severe bilateral facet arthropathy. Moderate bilateral neuroforaminal stenosis. Moderate spinal canal stenosis.   L4-L5: Disc bulge. Severe bilateral facet arthropathy. Mild left neuroforaminal stenosis. Moderate spinal canal stenosis.   L5-S1: Disc bulge with central protrusion. Mild bilateral facet arthropathy. Moderate left neuroforaminal stenosis. Moderate spinal canal stenosis.   IMPRESSION: 1. 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 2. Moderate foraminal stenoses bilaterally at L3-L4 and on the left at L5-S1.     Electronically Signed   By: Clem Savory M.D.   On: 04/27/2024 11:55     Objective:  VS:  HT:    WT:   BMI:     BP:(!) 143/88  HR:99bpm  TEMP: ( )  RESP:  Physical Exam Vitals and nursing note reviewed.  Constitutional:      General: She is not in acute distress.    Appearance: Normal appearance. She is obese. She is not ill-appearing.  HENT:     Head: Normocephalic and atraumatic.     Right Ear: External ear normal.     Left Ear: External ear normal.  Eyes:     Extraocular Movements: Extraocular movements intact.  Cardiovascular:     Rate and Rhythm: Normal rate.     Pulses: Normal pulses.  Pulmonary:     Effort: Pulmonary effort is normal. No respiratory distress.  Abdominal:     General: There is no distension.     Palpations: Abdomen is soft.  Musculoskeletal:         General: Tenderness present.     Cervical back: Neck supple.     Right lower leg: No edema.     Left lower leg: No edema.     Comments: Patient has good distal strength with no pain over the greater trochanters.  No clonus or focal weakness.  Skin:    Findings: No erythema, lesion or rash.  Neurological:     General: No focal deficit present.     Mental Status: She is alert and oriented to person, place, and time.     Sensory: No sensory deficit.     Motor: No weakness or abnormal muscle tone.     Coordination: Coordination normal.  Psychiatric:        Mood and Affect: Mood normal.        Behavior: Behavior normal.      Imaging: No results found.

## 2024-05-18 NOTE — Procedures (Signed)
 Lumbar Epidural Steroid Injection - Interlaminar Approach with Fluoroscopic Guidance  Patient: Connie Cook      Date of Birth: Nov 29, 1960 MRN: [id number] PCP: Delbert Clam, MD      Visit Date: 05/18/2024   Universal Protocol:     Consent Given By: the patient  Position: PRONE  Additional Comments: Vital signs were monitored before and after the procedure. Patient was prepped and draped in the usual sterile fashion. The correct patient, procedure, and site was verified.   Injection Procedure Details:   Procedure diagnoses: Lumbar radiculopathy [M54.16]   Meds Administered:  Meds ordered this encounter  Medications   methylPREDNISolone  acetate (DEPO-MEDROL ) injection 40 mg     Laterality: Left  Location/Site:  L5-S1  Needle: 4.5 in., 20 ga. Tuohy  Needle Placement: Paramedian epidural  Findings:   -Comments: Excellent flow of contrast into the epidural space.  Procedure Details: Using a paramedian approach from the side mentioned above, the region overlying the inferior lamina was localized under fluoroscopic visualization and the soft tissues overlying this structure were infiltrated with 4 ml. of 1% Lidocaine  without Epinephrine. The Tuohy needle was inserted into the epidural space using a paramedian approach.   The epidural space was localized using loss of resistance along with counter oblique bi-planar fluoroscopic views.  After negative aspirate for air, blood, and CSF, a 2 ml. volume of Isovue-250 was injected into the epidural space and the flow of contrast was observed. Radiographs were obtained for documentation purposes.    The injectate was administered into the level noted above.   Additional Comments:  The patient tolerated the procedure well Dressing: 2 x 2 sterile gauze and Band-Aid    Post-procedure details: Patient was observed during the procedure. Post-procedure instructions were reviewed.  Patient left the clinic in stable  condition.

## 2024-05-18 NOTE — Progress Notes (Signed)
 Pain Scale   Average Pain 8 Patient advising she has lower back pain that radiates to left hip and leg. Patient advising walking and sitting on left side increase her pain. Patient advises she get relief when sitting on right side of her bottom.        +Driver, -BT, -Dye Allergies.

## 2024-06-19 ENCOUNTER — Other Ambulatory Visit: Payer: Self-pay | Admitting: Family Medicine

## 2024-06-19 ENCOUNTER — Telehealth: Payer: Self-pay | Admitting: Physical Medicine and Rehabilitation

## 2024-06-19 ENCOUNTER — Telehealth: Payer: Self-pay

## 2024-06-19 NOTE — Telephone Encounter (Signed)
 Patient called. Says she is still in pain

## 2024-06-19 NOTE — Telephone Encounter (Signed)
 Patient advising she needs pain medication for her her back and medication is not working that was sent last time.

## 2024-06-19 NOTE — Telephone Encounter (Signed)
 Pt states she is in a lot of pain and had injection 4 wks ago and asking for Mity for a call back. Please call pt at [phone].

## 2024-06-20 ENCOUNTER — Telehealth: Payer: Self-pay | Admitting: Physical Medicine and Rehabilitation

## 2024-06-20 NOTE — Telephone Encounter (Signed)
 Pt called asking for a refill of pain medication. Please send to [facility] [address] Pt phone number is [phone].

## 2024-06-21 NOTE — Telephone Encounter (Signed)
 Requested medication (s) are due for refill today: yes  Requested medication (s) are on the active medication list: yes  Last refill:  05/17/24  Future visit scheduled:No  Notes to clinic:  Unable to refill per protocol due to failed labs, no updated results.      Requested Prescriptions  Pending Prescriptions Disp Refills   metFORMIN  (GLUCOPHAGE ) 500 MG tablet [Pharmacy Med Name: METFORMIN  HCL 500 MG TABLET] 30 tablet 0    Sig: TAKE 1 TABLET DAILY WITH BREAKFAST     Endocrinology:  Diabetes - Biguanides Failed - 06/21/2024  1:12 PM      Failed - Cr in normal range and within 360 days    Creatinine, Ser  Date Value Ref Range Status  03/31/2023 0.77 0.57 - 1.00 mg/dL Final         Failed - HBA1C is between 0 and 7.9 and within 180 days    Hgb A1c MFr Bld  Date Value Ref Range Status  10/04/2023 6.8 (H) 4.8 - 5.6 % Final    Comment:             Prediabetes: 5.7 - 6.4          Diabetes: >6.4          Glycemic control for adults with diabetes: <7.0          Failed - eGFR in normal range and within 360 days    eGFR  Date Value Ref Range Status  03/31/2023 88 >59 mL/min/1.73 Final         Failed - B12 Level in normal range and within 720 days    Vitamin B-12  Date Value Ref Range Status  10/04/2023 214 (L) 232 - 1,245 pg/mL Final         Failed - CBC within normal limits and completed in the last 12 months    WBC  Date Value Ref Range Status  03/31/2023 8.1 3.4 - 10.8 x10E3/uL Final   RBC  Date Value Ref Range Status  03/31/2023 4.72 3.77 - 5.28 x10E6/uL Final   Hemoglobin  Date Value Ref Range Status  03/31/2023 13.3 11.1 - 15.9 g/dL Final   Hematocrit  Date Value Ref Range Status  03/31/2023 40.0 34.0 - 46.6 % Final   MCHC  Date Value Ref Range Status  03/31/2023 33.3 31.5 - 35.7 g/dL Final   MCH  Date Value Ref Range Status  03/31/2023 28.2 26.6 - 33.0 pg Final   MCV  Date Value Ref Range Status  03/31/2023 85 79 - 97 fL Final   No results found  for: PLTCOUNTKUC, LABPLAT, POCPLA RDW  Date Value Ref Range Status  03/31/2023 11.9 11.7 - 15.4 % Final         Passed - Valid encounter within last 6 months    Recent Outpatient Visits           2 months ago Encounter for [employer] annual wellness exam   [facility] [place]. [facility] Delbert Clam, MD   4 months ago Infected cyst of skin   [facility] [place]. [facility] Vicci Sober B, MD   7 months ago Type 2 diabetes mellitus with other specified complication, without long-term current use of insulin (HCC)   [facility] [place]. [facility] Delbert Clam, MD   8 months ago Memory changes  [facility] [place]. [facility] Delbert Clam, MD   1 year ago Annual physical exam   [facility] [place] [facility] [place]. [facility] Delbert Clam, MD       Future Appointments             In 5 days Williams, Megan E, NP [facility]

## 2024-06-22 ENCOUNTER — Other Ambulatory Visit: Payer: Self-pay | Admitting: Physical Medicine and Rehabilitation

## 2024-06-22 ENCOUNTER — Telehealth: Payer: Self-pay

## 2024-06-22 NOTE — Telephone Encounter (Signed)
 Patient called asking about medication refill, patient was advised that medication needed a PA and as soon as that was [facility] will notify [facility].

## 2024-06-23 ENCOUNTER — Other Ambulatory Visit: Payer: Self-pay

## 2024-06-23 ENCOUNTER — Encounter (HOSPITAL_COMMUNITY): Payer: Self-pay | Admitting: *Deleted

## 2024-06-23 ENCOUNTER — Emergency Department (HOSPITAL_COMMUNITY)
Admission: EM | Admit: 2024-06-23 | Discharge: 2024-06-24 | Disposition: A | Discharge status: Home / Self Care | Attending: Emergency Medicine | Admitting: Emergency Medicine

## 2024-06-23 DIAGNOSIS — Z7984 Long term (current) use of oral hypoglycemic drugs: Secondary | ICD-10-CM | POA: Diagnosis not present

## 2024-06-23 DIAGNOSIS — E119 Type 2 diabetes mellitus without complications: Secondary | ICD-10-CM | POA: Diagnosis not present

## 2024-06-23 DIAGNOSIS — M5442 Lumbago with sciatica, left side: Secondary | ICD-10-CM | POA: Diagnosis not present

## 2024-06-23 DIAGNOSIS — G8929 Other chronic pain: Secondary | ICD-10-CM | POA: Insufficient documentation

## 2024-06-23 DIAGNOSIS — M545 Low back pain, unspecified: Secondary | ICD-10-CM | POA: Diagnosis not present

## 2024-06-23 MED ORDER — OXYCODONE-ACETAMINOPHEN 5-325 MG PO TABS
1.0000 | ORAL_TABLET | Freq: Once | ORAL | Status: AC
  Administered 2024-06-23: 1 via ORAL

## 2024-06-23 MED ORDER — OXYCODONE-ACETAMINOPHEN 5-325 MG PO TABS
ORAL_TABLET | ORAL | Status: AC
  Filled 2024-06-23: qty 1

## 2024-06-23 NOTE — ED Triage Notes (Signed)
 Pt c/o left side back and leg pain. Pt had a injection about 5 weeks ago without any improvement. Pt states she has sciatica pain.

## 2024-06-24 MED ORDER — METHYLPREDNISOLONE 4 MG PO TBPK: ORAL_TABLET | ORAL | 0 refills | Status: DC

## 2024-06-24 MED ORDER — LIDOCAINE 5 % EX PTCH
1.0000 | MEDICATED_PATCH | Freq: Once | CUTANEOUS | Status: DC
  Administered 2024-06-24: 1 via TRANSDERMAL
  Filled 2024-06-24: qty 1

## 2024-06-24 MED ORDER — LIDOCAINE 5 % EX PTCH
1.0000 | MEDICATED_PATCH | CUTANEOUS | 0 refills | Status: DC
Start: 2024-06-24 — End: 2024-09-26

## 2024-06-24 MED ORDER — HYDROCODONE-ACETAMINOPHEN 5-325 MG PO TABS: 1.0000 | ORAL_TABLET | ORAL | 0 refills | Status: DC | PRN

## 2024-06-24 MED ORDER — HYDROCODONE-ACETAMINOPHEN 5-325 MG PO TABS
1.0000 | ORAL_TABLET | Freq: Once | ORAL | Status: AC
  Administered 2024-06-24: 1 via ORAL
  Filled 2024-06-24: qty 1

## 2024-06-24 NOTE — ED Provider Notes (Signed)
 [place] EMERGENCY DEPARTMENT AT [facility] Provider Note   CSN: [id number] Arrival date & time: 06/23/24  1752     Patient presents with: Back Pain   Connie Cook is a 63 y.o. female.   The history is provided by the patient and medical records.  Back Pain  63 year old female with history of anxiety, PTSD, diabetes, memory loss, presenting to the ED with ongoing back pain.  This has been present for 6+ years, worse over the past few months.  She had an injection with Dr. Eldonna 05/18/24.  States she feels like he hit a nerve during procedure and did not get any relief with this.  States she has ongoing pain shooting down the legs, left more so than right.  She reports some intermittent paresthesias but denies numbness/weakness.  She has not had any falls.  No bowel/bladder incontinence.  States she has been calling his office trying to get something for pain relief but apparently has had some issues with the pharmacy needing authorization.  She has an appt with Dr. Eldonna on Monday (2 days from now).    Prior to Admission medications   Medication Sig Start Date End Date Taking? Authorizing Provider  albuterol  (VENTOLIN  HFA) 108 (90 Base) MCG/ACT inhaler Inhale 2 puffs into the lungs every 4 (four) hours as needed for wheezing or shortness of breath. 10/04/23   Newlin, Enobong, MD  ARIPiprazole  (ABILIFY ) 15 MG tablet Take 1 tablet (15 mg total) by mouth at bedtime. 09/28/23     ARIPiprazole  (ABILIFY ) 15 MG tablet Take 1 tablet (15 mg total) by mouth at bedtime. 11/26/23     ARIPiprazole  (ABILIFY ) 15 MG tablet Take 1 tablet by mouth once a day at bedtime 01/18/24     ARIPiprazole  (ABILIFY ) 15 MG tablet Take 1 tablet (15 mg total) by mouth at bedtime. 01/18/24   Akintayo, Mojeed, MD  ARIPiprazole  (ABILIFY ) 15 MG tablet Take 1 tablet (15 mg total) by mouth at bedtime. 03/09/24     ARIPiprazole  (ABILIFY ) 15 MG tablet Take 1 tablet orally once a day at bedtime 05/03/24      atorvastatin  (LIPITOR) 80 MG tablet Take 1 tablet (80 mg total) by mouth daily. 04/01/23   Newlin, Enobong, MD  Blood Glucose Monitoring Suppl (ONETOUCH VERIO) w/Device KIT USE AS DIRECTED 11/16/23   Newlin, Enobong, MD  budesonide -formoterol  (SYMBICORT ) 80-4.5 MCG/ACT inhaler Inhale 2 puffs into the lungs 2 (two) times daily with spacer and rinse mouth afterwards. 11/15/23   Luke Orlan HERO, DO  cyanocobalamin  (VITAMIN B12) 1000 MCG tablet Take 1 tablet (1,000 mcg total) by mouth daily. 11/25/23   Newlin, Enobong, MD  gabapentin  (NEURONTIN ) 300 MG capsule Take 1 capsule (300 mg total) by mouth 3 (three) times daily. 05/25/23   Lynnette Barter, MD  glucose blood (ONETOUCH ULTRA BLUE TEST) test strip Use as instructed 11/16/23   Newlin, Enobong, MD  hydrOXYzine  (ATARAX ) 25 MG tablet Take 1 tablet (25 mg total) by mouth 3 (three) times daily as needed. 05/25/23   Lynnette Barter, MD  ibuprofen (ADVIL) 200 MG tablet Take 200 mg by mouth every 6 (six) hours as needed.    [provider]  ipratropium (ATROVENT ) 0.03 % nasal spray Place 1-2 sprays into both nostrils 2 (two) times daily as needed (nasal drainage). 11/22/23   Luke Orlan HERO, DO  Lancets [facility] ULTRASOFT) lancets Use as instructed daily 11/16/23   Newlin, Enobong, MD  LORazepam  (ATIVAN ) 1 MG tablet Take 1 mg by mouth daily.  [provider]  LORazepam  (ATIVAN ) 1 MG tablet Take 1/2-1 tablet (1 mg total) by mouth at bedtime as needed. 01/26/24     Magnesium  100 MG TABS Take 2-3 tablets (200-300 mg total) by mouth at bedtime. 08/03/23   Akintayo, Mojeed, MD  Magnesium  100 MG TABS Take 2 to 3 tablets by mouth once a day at bedtime. 05/03/24   Akintayo, Mojeed, MD  Magnesium  Bisglycinate (MAG GLYCINATE) 100 MG TABS Take 2-3 tablets by mouth ONCE A  DAY AT BEDTIME. 11/26/23     Magnesium  Glycinate 100 MG CAPS Take 2-3 tablets by mouth as bedtime 01/18/24     Magnesium  Glycinate 100 MG CAPS Take 2-3 capsules by mouth at bedtime. 03/09/24   Akintayo, Mojeed,  MD  memantine  (NAMENDA ) 5 MG tablet Take 1 tablet (5 mg total) by mouth 2 (two) times daily. 11/16/23   Newlin, Enobong, MD  metFORMIN  (GLUCOPHAGE ) 500 MG tablet Take 1 tablet (500 mg total) by mouth daily with breakfast. Please schedule appointment with Dr. Newlin for additional refills. 06/21/24   Newlin, Enobong, MD  methocarbamol  (ROBAXIN ) 750 MG tablet Take 1 tablet (750 mg total) by mouth 2 (two) times daily as needed for musce spasms. 10/26/23   Jule Ronal CROME, PA-C  nystatin  cream (MYCOSTATIN ) Apply 1 Application topically 2 (two) times daily. 10/04/23   Newlin, Enobong, MD  Semaglutide ,0.25 or 0.5MG /DOS, (OZEMPIC , 0.25 OR 0.5 MG/DOSE,) 2 MG/3ML SOPN Inject 0.25 mg into the skin once a week. Patient not taking: Reported on 04/05/2024 11/16/23   Newlin, Enobong, MD  sertraline  (ZOLOFT ) 100 MG tablet Take 2 tablets (200 mg total) by mouth every morning after meals 09/28/23     sertraline  (ZOLOFT ) 100 MG tablet Take 2 tablets (200 mg total) by mouth every morning AFTER MEALS. 11/26/23     sertraline  (ZOLOFT ) 100 MG tablet Take 2 tablets (200 mg total) by mouth in the morning AFTER MEALS 01/18/24     sertraline  (ZOLOFT ) 100 MG tablet Take 2 tablets (200 mg total) by mouth in the morning after a meal 03/09/24     sertraline  (ZOLOFT ) 100 MG tablet Take 2 tablets by mouth every morning after meals 05/03/24     sulfamethoxazole -trimethoprim  (BACTRIM  DS) 800-160 MG tablet Take 1 tablet by mouth 2 (two) times daily. 01/25/24   Vicci Barnie NOVAK, MD  triamcinolone ointment (KENALOG) 0.1 % Apply 1 Application topically 2 (two) times daily. 10/25/23   [provider]    Allergies: Codeine, Penicillins, Prednisone, and Tramadol    Review of Systems  Musculoskeletal:  Positive for back pain.  All other systems reviewed and are negative.   Updated Vital Signs BP (!) 141/83 (BP Location: Right Arm)   Pulse 85   Temp 98.7 F (37.1 C) (Oral)   Resp 18   Ht 5' 2 (1.575 m)   Wt 103.4 kg   SpO2  97%   BMI 41.69 kg/m   Physical Exam Vitals and nursing note reviewed.  Constitutional:      Appearance: She is well-developed.  HENT:     Head: Normocephalic and atraumatic.  Eyes:     Conjunctiva/sclera: Conjunctivae normal.     Pupils: Pupils are equal, round, and reactive to light.  Cardiovascular:     Rate and Rhythm: Normal rate and regular rhythm.     Heart sounds: Normal heart sounds.  Pulmonary:     Effort: Pulmonary effort is normal.     Breath sounds: Normal breath sounds.  Abdominal:  General: Bowel sounds are normal.     Palpations: Abdomen is soft.  Musculoskeletal:        General: Normal range of motion.     Cervical back: Normal range of motion.     Comments: No acute deformities noted on the LS on exam, able to move each leg independently, does have some pain with SLR on left, able to flex/extend at hip and knee independently without issue, normal sensation throughout both legs, DP pulses intact BLE  Skin:    General: Skin is warm and dry.  Neurological:     Mental Status: She is alert and oriented to person, place, and time.     (all labs ordered are listed, but only abnormal results are displayed) Labs Reviewed - No data to display  EKG: None  Radiology: No results found.   Procedures   Medications Ordered in the ED  lidocaine  (LIDODERM ) 5 % 1 patch (1 patch Transdermal Patch Applied 06/24/24 0220)  oxyCODONE -acetaminophen  (PERCOCET/ROXICET) 5-325 MG per tablet 1 tablet (1 tablet Oral Given 06/23/24 1817)  HYDROcodone -acetaminophen  (NORCO/VICODIN) 5-325 MG per tablet 1 tablet (1 tablet Oral Given 06/24/24 0220)                                    Medical Decision Making Risk Prescription drug management.   63 year old female presenting to the ED with back pain.  Had a lumbar injection by Dr. Eldonna a few weeks back, has not had any significant improvement.  Continues to have some sciatic type pain.  She has an appointment with him in 2 days.   Has called the office and requested some pain medication but some issues with pharmacy.  She is afebrile and nontoxic in appearance here.  She does not have any focal neurologic deficits on exam.  She is able to move her legs independently, normal strength against resistance.  No foot drop.  She has not had any bowel/bladder incontinence.  She has no red flag symptoms currently.  As she has an appt with specialist in 2 days, do not feel emergent MRI or other imaging indicated at this time, rather will defer to specialist.  She is given medications for symptomatic control over weekend, discussed dosing with daughter at bedside.  Can return here for new concerns.  Final diagnoses:  Chronic bilateral low back pain with left-sided sciatica    ED Discharge Orders          Ordered    HYDROcodone -acetaminophen  (NORCO/VICODIN) 5-325 MG tablet  Every 4 hours PRN        06/24/24 0214    lidocaine  (LIDODERM ) 5 %  Every 24 hours        06/24/24 0214    methylPREDNISolone  (MEDROL  DOSEPAK) 4 MG TBPK tablet        06/24/24 0214               Jarold Olam HERO, PA-C 06/24/24 [id number]    Jerral Meth, MD 06/24/24 0532

## 2024-06-24 NOTE — Discharge Instructions (Signed)
 Take the prescribed medication as directed.  Can cut the hydrocodone  in half if need/want a smaller dose. Follow-up with Dr. Eldonna on Monday as scheduled. Return to the ED for new or worsening symptoms.

## 2024-06-26 ENCOUNTER — Other Ambulatory Visit: Payer: Self-pay

## 2024-06-26 ENCOUNTER — Encounter: Payer: Self-pay | Admitting: Physical Medicine and Rehabilitation

## 2024-06-26 ENCOUNTER — Ambulatory Visit: Admitting: Physical Medicine and Rehabilitation

## 2024-06-26 ENCOUNTER — Other Ambulatory Visit (HOSPITAL_COMMUNITY): Payer: Self-pay

## 2024-06-26 DIAGNOSIS — G894 Chronic pain syndrome: Secondary | ICD-10-CM | POA: Diagnosis not present

## 2024-06-26 DIAGNOSIS — M5416 Radiculopathy, lumbar region: Secondary | ICD-10-CM | POA: Diagnosis not present

## 2024-06-26 DIAGNOSIS — M5442 Lumbago with sciatica, left side: Secondary | ICD-10-CM | POA: Diagnosis not present

## 2024-06-26 DIAGNOSIS — G8929 Other chronic pain: Secondary | ICD-10-CM

## 2024-06-26 DIAGNOSIS — R269 Unspecified abnormalities of gait and mobility: Secondary | ICD-10-CM | POA: Diagnosis not present

## 2024-06-26 MED ORDER — HYDROCODONE-ACETAMINOPHEN 5-325 MG PO TABS: 1.0000 | ORAL_TABLET | Freq: Three times a day (TID) | ORAL | 0 refills | Status: DC | PRN

## 2024-06-26 NOTE — Progress Notes (Signed)
 Pain Scale   Average Pain 10 Patient advising her injection did not give her any relief.        +Driver, -BT, -Dye Allergies.

## 2024-06-26 NOTE — Progress Notes (Signed)
 Connie Cook - 63 y.o. female MRN [id number]  Date of birth: 1961/02/02  Office Visit Note: Visit Date: 06/26/2024 PCP: Delbert Clam, MD Referred by: Delbert Clam, MD  Subjective: Chief Complaint  Patient presents with   Lower Back - Pain   HPI: Connie Cook is a 63 y.o. female who comes in today for evaluation of chronic, worsening and severe left lower back pain radiating to buttock and down left posterolateral leg. Pain ongoing for several years, worsens with movement and activity. She describes as sore and shocking sensation, currently rates as 10 out of 10. States she is having trouble standing and walking due to pain. She was seen in the emergency department on 06/23/2024 due to increased pain, prescribed Norco, oral prednisone and Lidocaine  patches. Minimal relief of pain with these medications. History of formal physical therapy, minimal relief of pain with these treatments. 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 There is moderate foraminal stenoses bilaterally at L3-L4 and on the left at L5-S1. She underwent left L5-S1 interlaminar epidural steroid injection in our office, no relief of pain with this injection. States this injection was very painful and does not wish to continue with interventional procedures. Patient denies recent trauma or falls. She is currently using cane to assist with ambulation.   Patients course is complicated by diabetes mellitus, PTSD, memory loss, psoriatic arthritis, depression and anxiety.         Review of Systems  Musculoskeletal:  Positive for back pain and myalgias.  Neurological:  Negative for tingling, sensory change, focal weakness and weakness.  All other systems reviewed and are negative.  Otherwise per HPI.  Assessment & Plan: Visit Diagnoses:    ICD-10-CM   1. Chronic  left-sided low back pain with left-sided sciatica  M54.42    G89.29     2. Radiculopathy, lumbar region  M54.16     3. Gait abnormality  R26.9     4. Chronic pain syndrome  G89.4        Plan: Findings:  Chronic, worsening and severe left sided lower back pain radiating to buttock and down posterolateral leg to foot. Patient continues to have severe pain despite good conservative therapies such as formal physical therapy, home exercise regimen, rest and use of medications. Recent left L5-S1 interlaminar epidural steroid injection did not provide significant pain relief for her. Her symptoms do seem to be radicular in nature, more of L5 and S1 nerve pattern, however I do think there is a chronic pain aspect contributing to her symptoms. We discussed treatment options in detail today. She would like to consult with surgeon to discuss further treatment options, I did place referral to Dr. Ozell Ada. I also refilled short course of Norco for her, if required long time would consider referral to chronic pain management practice. She has no questions at this time. No red flag symptoms noted upon exam today.     Meds & Orders: No orders of the defined types were placed in this encounter.  No orders of the defined types were placed in this encounter.   Follow-up: Return if symptoms worsen or fail to improve.   Procedures: No procedures performed      Clinical History: CLINICAL DATA:  Low back pain   EXAM: MRI LUMBAR SPINE WITHOUT CONTRAST   TECHNIQUE: Multiplanar, multisequence MR imaging of the lumbar spine was  performed. No intravenous contrast was administered.   COMPARISON:  None Available.   FINDINGS: Segmentation: Standard.   Alignment:  Physiologic lumbar alignment is maintained.   Vertebrae: Schmorl's nodes at a few levels. Vertebral bodies demonstrate normal signal intensity. No compression fractures.   Conus medullaris and cauda equina: The conus medullaris  terminates at the level of L1-L2. The distal spinal cord signal intensity is normal. Congenital narrowing of the canal.   Paraspinal and other soft tissues: The visualized abdomen and pelvis show no soft tissue abnormality. The visualized aorta is normal.   Disc levels:   L1-L2: Disc is normal in configuration. Moderate bilateral facet arthropathy. No neuroforaminal stenosis. No spinal canal stenosis.   L2-L3: Disc bulge. Moderate bilateral facet arthropathy. No neuroforaminal stenosis. Mild spinal canal stenosis.   L3-L4: Disc bulge. Severe bilateral facet arthropathy. Moderate bilateral neuroforaminal stenosis. Moderate spinal canal stenosis.   L4-L5: Disc bulge. Severe bilateral facet arthropathy. Mild left neuroforaminal stenosis. Moderate spinal canal stenosis.   L5-S1: Disc bulge with central protrusion. Mild bilateral facet arthropathy. Moderate left neuroforaminal stenosis. Moderate spinal canal stenosis.   IMPRESSION: 1. 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 2. Moderate foraminal stenoses bilaterally at L3-L4 and on the left at L5-S1.     Electronically Signed   By: Clem Savory M.D.   On: 04/27/2024 11:55   She reports that she has never smoked. She has been exposed to tobacco smoke. She has never used smokeless tobacco.  Recent Labs    10/04/23 1429  HGBA1C 6.8*    Objective:  VS:  HT:    WT:   BMI:     BP:   HR: bpm  TEMP: ( )  RESP:  Physical Exam Vitals and nursing note reviewed.  HENT:     Head: Normocephalic and atraumatic.     Right Ear: External ear normal.     Left Ear: External ear normal.     Nose: Nose normal.     Mouth/Throat:     Mouth: Mucous membranes are moist.  Eyes:     Extraocular Movements: Extraocular movements intact.  Cardiovascular:     Rate and Rhythm: Normal rate.     Pulses: Normal pulses.  Pulmonary:     Effort:  Pulmonary effort is normal.  Abdominal:     General: Abdomen is flat. There is no distension.  Musculoskeletal:        General: Tenderness present.     Cervical back: Normal range of motion.     Comments: Patient is slow to rise from seated position to standing. Mild pain noted with facet loading and lumbar extension. 5/5 strength noted with bilateral hip flexion, knee flexion/extension, ankle dorsiflexion/plantarflexion and EHL. No clonus noted bilaterally. No pain upon palpation of greater trochanters. No pain with internal/external rotation of bilateral hips. Sensation intact bilaterally. Negative slump test bilaterally. Ambulates without aid, gait steady.   Skin:    General: Skin is warm and dry.     Capillary Refill: Capillary refill takes less than 2 seconds.  Neurological:     General: No focal deficit present.     Mental Status: She is alert and oriented to person, place, and time.  Psychiatric:        Mood and Affect: Mood normal.        Behavior: Behavior normal.     Ortho Exam  Imaging: No results found.  Past Medical/Family/Surgical/Social History: Medications &  Allergies reviewed per EMR, new medications updated. Patient Active Problem List   Diagnosis Date Noted   Memory loss of unknown cause 01/11/2024   Pure hypercholesterolemia 06/26/2022   Type 2 diabetes mellitus (HCC) 06/26/2022   Tendinopathy of left rotator cuff 06/25/2022   Weight gain 04/29/2022   PTSD (post-traumatic stress disorder) 01/06/2021   Generalized anxiety disorder 01/06/2021   Major depressive disorder, recurrent episode, severe (HCC) 11/22/2020   Past Medical History:  Diagnosis Date   Allergy  07/23/1988   Anxiety    Arthritis    Asthma 07/18/2005   DDD (degenerative disc disease), cervical    Depression    H/O adenoidectomy    History of removal of ovarian cyst    Hyperlipidemia    Sleep apnea 2010   Family History  Problem Relation Age of Onset   Pancreatic cancer Mother     Allergic rhinitis Mother    Depression Mother    Cancer Mother    COPD Mother    Hyperlipidemia Mother    Hypertension Mother    Lung cancer Father        Smoker   Arthritis Father        Psoriatic arthritis   Parkinson's disease Father    Alcohol abuse Father    Cancer Father    COPD Father    Depression Father    Hearing loss Father    Hyperlipidemia Father    Lung cancer Maternal Uncle    Cancer Maternal Uncle    Congestive Heart Failure Paternal Grandmother    Arthritis Paternal Grandmother    Transient ischemic attack Maternal Grandmother    Diabetes Maternal Grandmother    Stroke Maternal Grandmother    Liver disease Paternal Grandfather    Anxiety disorder Niece    Depression Niece    ADD / ADHD Nephew    Colon cancer Maternal Uncle    Stomach cancer Maternal Uncle    Heart disease Other    Throat cancer Other        Smoker   Bladder Cancer Paternal Uncle    Alcohol abuse Paternal Uncle    Cancer Paternal Uncle    Depression Paternal Uncle    Cancer Maternal Uncle    Early death Maternal Uncle    Hyperlipidemia Sister    Hypertension Sister    Kidney disease Maternal Aunt    Past Surgical History:  Procedure Laterality Date   ADENOIDECTOMY     APPENDECTOMY     TONSILLECTOMY     Social History   Occupational History   Not on file  Tobacco Use   Smoking status: Never    Passive exposure: Past (Both parent)   Smokeless tobacco: Never  Vaping Use   Vaping status: Never Used  Substance and Sexual Activity   Alcohol use: Never   Drug use: Never   Sexual activity: Not Currently    Birth control/protection: Post-menopausal

## 2024-06-27 ENCOUNTER — Other Ambulatory Visit: Payer: Self-pay | Admitting: Physical Medicine and Rehabilitation

## 2024-06-27 ENCOUNTER — Telehealth: Payer: Self-pay | Admitting: Physical Medicine and Rehabilitation

## 2024-06-27 DIAGNOSIS — L4050 Arthropathic psoriasis, unspecified: Secondary | ICD-10-CM

## 2024-06-27 NOTE — Telephone Encounter (Signed)
 Patient called. She would like to be referred to a Rheumatologists. Dr. Mai

## 2024-06-29 ENCOUNTER — Other Ambulatory Visit: Payer: Self-pay | Admitting: Physical Medicine and Rehabilitation

## 2024-06-29 ENCOUNTER — Telehealth: Payer: Self-pay | Admitting: Physical Medicine and Rehabilitation

## 2024-06-29 DIAGNOSIS — M5416 Radiculopathy, lumbar region: Secondary | ICD-10-CM

## 2024-06-29 NOTE — Telephone Encounter (Signed)
 Pt called requesting a referral be sent to Dr Rockey Peru at [facility] phone number [phone]. Pt phone number is [phone].Pt has cancelled appt with Georgina and prefer to see Cabbell.

## 2024-06-29 NOTE — Telephone Encounter (Signed)
 Patient's sister called. Would like the referral for patient to go see Dr. Rockey Peru instead of Dr. Georgina.

## 2024-06-30 ENCOUNTER — Telehealth: Payer: Self-pay | Admitting: Physical Medicine and Rehabilitation

## 2024-06-30 NOTE — Telephone Encounter (Signed)
 Records faxed to Dr. Gillie [phone] referred patient

## 2024-07-04 ENCOUNTER — Other Ambulatory Visit (HOSPITAL_COMMUNITY): Payer: Self-pay | Admitting: Family Medicine

## 2024-07-04 ENCOUNTER — Other Ambulatory Visit: Payer: Self-pay | Admitting: Family Medicine

## 2024-07-04 ENCOUNTER — Other Ambulatory Visit (HOSPITAL_COMMUNITY): Payer: Self-pay

## 2024-07-04 ENCOUNTER — Other Ambulatory Visit: Payer: Self-pay

## 2024-07-04 DIAGNOSIS — F411 Generalized anxiety disorder: Secondary | ICD-10-CM

## 2024-07-11 DIAGNOSIS — M5127 Other intervertebral disc displacement, lumbosacral region: Secondary | ICD-10-CM | POA: Diagnosis not present

## 2024-07-11 DIAGNOSIS — Z6839 Body mass index (BMI) 39.0-39.9, adult: Secondary | ICD-10-CM | POA: Diagnosis not present

## 2024-07-13 ENCOUNTER — Other Ambulatory Visit: Payer: Self-pay | Admitting: Family Medicine

## 2024-07-30 ENCOUNTER — Other Ambulatory Visit: Payer: Self-pay | Admitting: Family Medicine

## 2024-07-30 ENCOUNTER — Other Ambulatory Visit (HOSPITAL_COMMUNITY): Payer: Self-pay

## 2024-07-31 ENCOUNTER — Other Ambulatory Visit (HOSPITAL_COMMUNITY): Payer: Self-pay

## 2024-07-31 ENCOUNTER — Other Ambulatory Visit: Payer: Self-pay | Admitting: Family Medicine

## 2024-07-31 ENCOUNTER — Other Ambulatory Visit: Payer: Self-pay

## 2024-07-31 ENCOUNTER — Other Ambulatory Visit (HOSPITAL_COMMUNITY): Payer: Self-pay | Admitting: Family Medicine

## 2024-07-31 ENCOUNTER — Encounter: Admitting: Orthopedic Surgery

## 2024-07-31 DIAGNOSIS — F411 Generalized anxiety disorder: Secondary | ICD-10-CM

## 2024-08-01 ENCOUNTER — Other Ambulatory Visit (HOSPITAL_COMMUNITY): Payer: Self-pay

## 2024-08-01 ENCOUNTER — Other Ambulatory Visit: Payer: Self-pay

## 2024-08-01 MED ORDER — HYDROXYZINE HCL 25 MG PO TABS
25.0000 mg | ORAL_TABLET | Freq: Three times a day (TID) | ORAL | 0 refills | Status: DC | PRN
  Filled 2024-08-01: qty 90, 30d supply, fill #0

## 2024-08-01 MED ORDER — METFORMIN HCL 500 MG PO TABS
500.0000 mg | ORAL_TABLET | Freq: Every day | ORAL | 0 refills | Status: DC
  Filled 2024-08-01: qty 30, 30d supply, fill #0

## 2024-08-01 MED ORDER — ATORVASTATIN CALCIUM 80 MG PO TABS
80.0000 mg | ORAL_TABLET | Freq: Every day | ORAL | 0 refills | Status: DC
  Filled 2024-08-01: qty 90, 90d supply, fill #0

## 2024-08-02 ENCOUNTER — Other Ambulatory Visit (HOSPITAL_COMMUNITY): Payer: Self-pay

## 2024-08-03 ENCOUNTER — Other Ambulatory Visit (HOSPITAL_COMMUNITY): Payer: Self-pay

## 2024-08-03 ENCOUNTER — Other Ambulatory Visit: Payer: Self-pay

## 2024-08-03 DIAGNOSIS — F410 Panic disorder [episodic paroxysmal anxiety] without agoraphobia: Secondary | ICD-10-CM | POA: Diagnosis not present

## 2024-08-03 DIAGNOSIS — F411 Generalized anxiety disorder: Secondary | ICD-10-CM | POA: Diagnosis not present

## 2024-08-03 DIAGNOSIS — F4312 Post-traumatic stress disorder, chronic: Secondary | ICD-10-CM | POA: Diagnosis not present

## 2024-08-03 DIAGNOSIS — F331 Major depressive disorder, recurrent, moderate: Secondary | ICD-10-CM | POA: Diagnosis not present

## 2024-08-03 MED ORDER — SERTRALINE HCL 100 MG PO TABS
200.0000 mg | ORAL_TABLET | Freq: Every morning | ORAL | 1 refills | Status: AC
  Filled 2024-09-15: qty 60, 30d supply, fill #0
  Filled 2024-12-07: qty 60, 30d supply, fill #1

## 2024-08-03 MED ORDER — MAGNESIUM GLYCINATE 100 MG PO CAPS
2.0000 | ORAL_CAPSULE | Freq: Every evening | ORAL | 1 refills | Status: AC
  Filled 2024-10-23: qty 120, 40d supply, fill #0

## 2024-08-03 MED ORDER — HYDROXYZINE HCL 25 MG PO TABS: 12.5000 mg | ORAL_TABLET | Freq: Two times a day (BID) | ORAL | 1 refills | Status: DC

## 2024-08-03 MED ORDER — ARIPIPRAZOLE 15 MG PO TABS: 15.0000 mg | ORAL_TABLET | Freq: Every day | ORAL | 1 refills | Status: AC

## 2024-08-03 MED ORDER — SERTRALINE HCL 100 MG PO TABS: 200.0000 mg | ORAL_TABLET | Freq: Every day | ORAL | 1 refills | Status: AC

## 2024-08-04 ENCOUNTER — Other Ambulatory Visit (HOSPITAL_COMMUNITY): Payer: Self-pay

## 2024-08-09 ENCOUNTER — Other Ambulatory Visit (HOSPITAL_COMMUNITY): Payer: Self-pay

## 2024-08-09 ENCOUNTER — Other Ambulatory Visit: Payer: Self-pay | Admitting: Family Medicine

## 2024-08-09 ENCOUNTER — Other Ambulatory Visit: Payer: Self-pay

## 2024-08-09 MED ORDER — TRIAMCINOLONE ACETONIDE 0.1 % EX OINT
1.0000 | TOPICAL_OINTMENT | Freq: Two times a day (BID) | CUTANEOUS | 1 refills | Status: AC
  Filled 2024-08-09: qty 30, 15d supply, fill #0
  Filled 2024-10-23: qty 30, 15d supply, fill #1

## 2024-08-11 NOTE — Progress Notes (Signed)
 Connie Cook                                          MRN: [id number]   08/11/2024   The [facility] Quality Team Specialist reviewed this patient medical record for the purposes of chart review for care gap closure. The following were reviewed: chart review for care gap closure-glycemic status assessment.    [facility] Quality Team

## 2024-08-18 DIAGNOSIS — M5127 Other intervertebral disc displacement, lumbosacral region: Secondary | ICD-10-CM | POA: Diagnosis not present

## 2024-08-18 DIAGNOSIS — M47817 Spondylosis without myelopathy or radiculopathy, lumbosacral region: Secondary | ICD-10-CM | POA: Diagnosis not present

## 2024-08-18 DIAGNOSIS — M48061 Spinal stenosis, lumbar region without neurogenic claudication: Secondary | ICD-10-CM | POA: Diagnosis not present

## 2024-08-18 DIAGNOSIS — M4316 Spondylolisthesis, lumbar region: Secondary | ICD-10-CM | POA: Diagnosis not present

## 2024-08-30 DIAGNOSIS — Z111 Encounter for screening for respiratory tuberculosis: Secondary | ICD-10-CM | POA: Diagnosis not present

## 2024-08-30 DIAGNOSIS — R5383 Other fatigue: Secondary | ICD-10-CM | POA: Diagnosis not present

## 2024-08-30 DIAGNOSIS — M6788 Other specified disorders of synovium and tendon, other site: Secondary | ICD-10-CM | POA: Diagnosis not present

## 2024-08-30 DIAGNOSIS — L4059 Other psoriatic arthropathy: Secondary | ICD-10-CM | POA: Diagnosis not present

## 2024-09-02 LAB — LAB REPORT - SCANNED
EGFR: 98
HM Hepatitis Screen: NEGATIVE

## 2024-09-12 ENCOUNTER — Other Ambulatory Visit (HOSPITAL_COMMUNITY): Payer: Self-pay

## 2024-09-12 DIAGNOSIS — L4059 Other psoriatic arthropathy: Secondary | ICD-10-CM | POA: Diagnosis not present

## 2024-09-12 DIAGNOSIS — M6788 Other specified disorders of synovium and tendon, other site: Secondary | ICD-10-CM | POA: Diagnosis not present

## 2024-09-12 MED ORDER — METHOTREXATE SODIUM 2.5 MG PO TABS
10.0000 mg | ORAL_TABLET | ORAL | 1 refills | Status: AC
  Filled 2024-09-12: qty 48, 84d supply, fill #0
  Filled 2024-09-13: qty 52, 91d supply, fill #0
  Filled 2024-12-07: qty 52, 91d supply, fill #1

## 2024-09-12 MED ORDER — FOLIC ACID 1 MG PO TABS
1.0000 mg | ORAL_TABLET | Freq: Every day | ORAL | 3 refills | Status: AC
  Filled 2024-09-12 – 2024-09-13 (×2): qty 90, 90d supply, fill #0
  Filled 2024-12-07: qty 90, 90d supply, fill #1

## 2024-09-13 ENCOUNTER — Other Ambulatory Visit (HOSPITAL_COMMUNITY): Payer: Self-pay

## 2024-09-13 ENCOUNTER — Encounter: Payer: Self-pay | Admitting: Family Medicine

## 2024-09-13 ENCOUNTER — Other Ambulatory Visit: Payer: Self-pay

## 2024-09-13 ENCOUNTER — Ambulatory Visit: Attending: Family Medicine | Admitting: Family Medicine

## 2024-09-13 VITALS — BP 135/80 | HR 84 | Temp 98.0°F | Ht 62.0 in | Wt 287.0 lb

## 2024-09-13 DIAGNOSIS — Z6841 Body Mass Index (BMI) 40.0 and over, adult: Secondary | ICD-10-CM

## 2024-09-13 DIAGNOSIS — Z7985 Long-term (current) use of injectable non-insulin antidiabetic drugs: Secondary | ICD-10-CM

## 2024-09-13 DIAGNOSIS — E785 Hyperlipidemia, unspecified: Secondary | ICD-10-CM

## 2024-09-13 DIAGNOSIS — F411 Generalized anxiety disorder: Secondary | ICD-10-CM | POA: Diagnosis not present

## 2024-09-13 DIAGNOSIS — B372 Candidiasis of skin and nail: Secondary | ICD-10-CM

## 2024-09-13 DIAGNOSIS — Z23 Encounter for immunization: Secondary | ICD-10-CM | POA: Diagnosis not present

## 2024-09-13 DIAGNOSIS — Z1211 Encounter for screening for malignant neoplasm of colon: Secondary | ICD-10-CM

## 2024-09-13 DIAGNOSIS — R413 Other amnesia: Secondary | ICD-10-CM

## 2024-09-13 DIAGNOSIS — E1169 Type 2 diabetes mellitus with other specified complication: Secondary | ICD-10-CM

## 2024-09-13 DIAGNOSIS — Z79899 Other long term (current) drug therapy: Secondary | ICD-10-CM

## 2024-09-13 DIAGNOSIS — Z7984 Long term (current) use of oral hypoglycemic drugs: Secondary | ICD-10-CM

## 2024-09-13 LAB — POCT GLYCOSYLATED HEMOGLOBIN (HGB A1C): HbA1c, POC (controlled diabetic range): 6 % (ref 0.0–7.0)

## 2024-09-13 MED ORDER — NYSTATIN 100000 UNIT/GM EX CREA
1.0000 | TOPICAL_CREAM | Freq: Two times a day (BID) | CUTANEOUS | 2 refills | Status: AC
  Filled 2024-09-13 (×2): qty 30, 15d supply, fill #0

## 2024-09-13 MED ORDER — HYDROXYZINE HCL 25 MG PO TABS
25.0000 mg | ORAL_TABLET | Freq: Three times a day (TID) | ORAL | 3 refills | Status: AC | PRN
Start: 2024-09-13 — End: ?
  Filled 2024-09-13 (×2): qty 90, 30d supply, fill #0

## 2024-09-13 MED ORDER — MEMANTINE HCL 5 MG PO TABS
5.0000 mg | ORAL_TABLET | Freq: Two times a day (BID) | ORAL | 3 refills | Status: AC
  Filled 2024-09-13 (×2): qty 60, 30d supply, fill #0
  Filled 2024-11-16: qty 60, 30d supply, fill #1
  Filled 2024-12-07 – 2024-12-20 (×2): qty 60, 30d supply, fill #2

## 2024-09-13 MED ORDER — SEMAGLUTIDE (1 MG/DOSE) 4 MG/3ML ~~LOC~~ SOPN
1.0000 mg | PEN_INJECTOR | SUBCUTANEOUS | 3 refills | Status: AC
  Filled 2024-09-13 – 2024-10-23 (×2): qty 3, 28d supply, fill #0
  Filled 2024-11-16: qty 3, 28d supply, fill #1
  Filled 2024-12-14: qty 3, 28d supply, fill #2

## 2024-09-13 MED ORDER — ATORVASTATIN CALCIUM 80 MG PO TABS
80.0000 mg | ORAL_TABLET | Freq: Every day | ORAL | 1 refills | Status: DC
  Filled 2024-09-13: qty 90, 90d supply, fill #0

## 2024-09-13 MED ORDER — OZEMPIC (0.25 OR 0.5 MG/DOSE) 2 MG/3ML ~~LOC~~ SOPN
0.5000 mg | PEN_INJECTOR | SUBCUTANEOUS | 0 refills | Status: AC
  Filled 2024-09-13: qty 3, fill #0
  Filled 2024-09-13: qty 3, 28d supply, fill #0

## 2024-09-13 NOTE — Patient Instructions (Signed)
 VISIT SUMMARY:  You had a follow-up appointment today to discuss your recovery after back surgery and to manage your diabetes medication. You are pleased with the outcome of your back surgery, and your diabetes management is showing improvement. We also discussed your weight management, cholesterol levels, anxiety, and memory issues.  YOUR PLAN:   -TYPE 2 DIABETES MELLITUS: Your A1c has improved to 6.0. You restarted Ozempic  after surgery and will increase the dose to help with weight loss. You will increase Ozempic  to 0.5 mg in the first week of November and to 1 mg in the first week of December. You will discontinue metformin  to avoid low blood sugar.  -MORBID OBESITY: Weight management is important for your overall health. Increasing your Ozempic  dose as planned for your diabetes will also help with weight loss.  -HYPERLIPIDEMIA: Hyperlipidemia means you have high cholesterol levels. We need to monitor your cholesterol, kidney, and liver function with a fasting blood test.  -GENERALIZED ANXIETY DISORDER: You are continuing with hydroxyzine  for anxiety management. Your prescription for hydroxyzine  will be refilled.  -MEMORY IMPAIRMENT, AT RISK FOR ALZHEIMER'S DISEASE: You are taking Namenda  for memory issues and are currently not experiencing significant problems. Your sister helps you manage your medications. Your prescription for Namenda  will be refilled.  -GENERAL HEALTH MAINTENANCE: You are due for a colonoscopy and have been referred to Dr. Kristie for this procedure. You also received a flu shot today.  INSTRUCTIONS:  Please follow up with Dr. Kristie for your colonoscopy. You will also need to have fasting blood work done to check your cholesterol, kidney, and liver function. Increase your Ozempic  dose as planned and discontinue metformin . Continue walking to strengthen your legs.

## 2024-09-13 NOTE — Progress Notes (Signed)
 Subjective:  Patient ID: Connie Cook, female    DOB: 1961/04/20  Age: 63 y.o. MRN: [id number]  CC: Medical Management of Chronic Issues (Discuss ozempic  medication)     Discussed the use of AI scribe software for clinical note transcription with the patient, who gave verbal consent to proceed.  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 who presents for follow-up after back surgery and management of her diabetes medication.  She underwent back surgery on August 18, 2024, and is pleased with the outcome. She experienced significant difficulty walking prior to the surgery, which led to the cancellation of a previous appointment. She did not undergo physical therapy post-surgery but had done so prior to the procedure.  For diabetes management, she discontinued Ozempic  prior to surgery and restarted it immediately after returning home. She is on a dose of 0.25 mg, currently in her third week, and her A1c has improved from 6.8 to 6.0. She also takes metformin  500 mg daily. She has gained seven pounds recently and is interested in weight management.  Her medication regimen includes atorvastatin  for cholesterol, hydroxyzine  for anxiety, and Namenda  for memory issues. Her sister assists with medication management, and she reports no significant memory issues currently.  She experienced a fall about two weeks ago, resulting in a pulled calf muscle, but reports no significant ongoing pain or limping. The fall occurred while going to church and wearing new shoes. She attributes some leg weakness to her back issues, which began last year.  She is due for a colonoscopy and has previously seen Dr. Kristie for this procedure. She plans to have labs drawn soon, as she ate prior to today's appointment.    Past  Medical History:  Diagnosis Date   Allergy  07/23/1988   Anxiety    Arthritis    Asthma 07/18/2005   DDD (degenerative disc disease), cervical    Depression    H/O adenoidectomy    History of removal of ovarian cyst    Hyperlipidemia    Sleep apnea 2010    Past Surgical History:  Procedure Laterality Date   ADENOIDECTOMY     APPENDECTOMY     TONSILLECTOMY      Family History  Problem Relation Age of Onset   Pancreatic cancer Mother    Allergic rhinitis Mother    Depression Mother    Cancer Mother    COPD Mother    Hyperlipidemia Mother    Hypertension Mother    Lung cancer Father        Smoker   Arthritis Father        Psoriatic arthritis   Parkinson's disease Father    Alcohol abuse Father    Cancer Father    COPD Father    Depression Father    Hearing loss Father    Hyperlipidemia Father    Lung cancer Maternal Uncle    Cancer Maternal Uncle    Congestive Heart Failure Paternal Grandmother    Arthritis Paternal Grandmother    Transient ischemic attack Maternal Grandmother    Diabetes Maternal Grandmother    Stroke Maternal Grandmother    Liver disease Paternal Grandfather    Anxiety disorder Niece    Depression Niece    ADD / ADHD Nephew    Colon cancer Maternal Uncle    Stomach cancer Maternal Uncle  Heart disease Other    Throat cancer Other        Smoker   Bladder Cancer Paternal Uncle    Alcohol abuse Paternal Uncle    Cancer Paternal Uncle    Depression Paternal Uncle    Cancer Maternal Uncle    Early death Maternal Uncle    Hyperlipidemia Sister    Hypertension Sister    Kidney disease Maternal Aunt     Social History   Socioeconomic History   Marital status: Single    Spouse name: Not on file   Number of children: 0   Years of education: Not on file   Highest education level: Some college, no degree  Occupational History   Not on file  Tobacco Use   Smoking status: Never    Passive exposure: Past (Both parent)   Smokeless  tobacco: Never  Vaping Use   Vaping status: Never Used  Substance and Sexual Activity   Alcohol use: Never   Drug use: Never   Sexual activity: Not Currently    Birth control/protection: Post-menopausal  Other Topics Concern   Not on file  Social History Narrative   Not on file   Social Drivers of Health   Financial Resource Strain: Medium Risk (04/05/2024)   Overall Financial Resource Strain (CARDIA)    Difficulty of Paying Living Expenses: Somewhat hard  Food Insecurity: No Food Insecurity (04/05/2024)   Hunger Vital Sign    Worried About Running Out of Food in the Last Year: Never true    Ran Out of Food in the Last Year: Never true  Transportation Needs: No Transportation Needs (04/05/2024)   PRAPARE - Administrator, Civil Service (Medical): No    Lack of Transportation (Non-Medical): No  Physical Activity: Inactive (04/05/2024)   Exercise Vital Sign    Days of Exercise per Week: 0 days    Minutes of Exercise per Session: 0 min  Stress: No Stress Concern Present (04/05/2024)   [employer] of Occupational Health - Occupational Stress Questionnaire    Feeling of Stress : Only a little  Social Connections: Socially Isolated (04/05/2024)   Social Connection and Isolation Panel    Frequency of Communication with Friends and Family: Three times a week    Frequency of Social Gatherings with Friends and Family: Twice a week    Attends Religious Services: Never    Database Administrator or Organizations: No    Attends Engineer, Structural: Never    Marital Status: Never married    Allergies  Allergen Reactions   Codeine Shortness Of Breath   Penicillins Rash   Prednisone Other (See Comments)    Has tolerated Depomedrol in past.   Tramadol Itching    Outpatient Medications Prior to Visit  Medication Sig Dispense Refill   albuterol  (VENTOLIN  HFA) 108 (90 Base) MCG/ACT inhaler Inhale 2 puffs into the lungs every 4 (four) hours as needed for wheezing  or shortness of breath. 6.7 g 3   ARIPiprazole  (ABILIFY ) 15 MG tablet Take 1 tablet by mouth once a day at bedtime 30 tablet 1   ARIPiprazole  (ABILIFY ) 15 MG tablet Take 1 tablet orally once a day at bedtime 30 tablet 1   ARIPiprazole  (ABILIFY ) 15 MG tablet Take 1 tablet (15 mg total) by mouth at bedtime. 30 tablet 1   Blood Glucose Monitoring Suppl (ONETOUCH VERIO) w/Device KIT USE AS DIRECTED 1 kit 0   budesonide -formoterol  (SYMBICORT ) 80-4.5 MCG/ACT inhaler Inhale 2 puffs into the  lungs 2 (two) times daily with spacer and rinse mouth afterwards. 10.2 g 3   cyanocobalamin  (VITAMIN B12) 1000 MCG tablet Take 1 tablet (1,000 mcg total) by mouth daily. 90 tablet 1   folic acid (FOLVITE) 1 MG tablet Take 1 tablet (1 mg total) by mouth daily. 90 tablet 3   glucose blood (ONETOUCH ULTRA BLUE TEST) test strip Use as instructed 100 each 12   hydrOXYzine  (ATARAX ) 25 MG tablet Take 1/2  tablet  by mouth daily as needed for anxiety attacks, and 1/2 tablet at bedtime for sleep/anxiety 30 tablet 1   ibuprofen (ADVIL) 200 MG tablet Take 200 mg by mouth every 6 (six) hours as needed.     ipratropium (ATROVENT ) 0.03 % nasal spray Place 1-2 sprays into both nostrils 2 (two) times daily as needed (nasal drainage). 30 mL 5   Lancets (ONETOUCH ULTRASOFT) lancets Use as instructed daily 100 each 12   LORazepam  (ATIVAN ) 1 MG tablet Take 1 mg by mouth daily.     LORazepam  (ATIVAN ) 1 MG tablet Take 1/2-1 tablet (1 mg total) by mouth at bedtime as needed. 30 tablet 1   Magnesium  Bisglycinate (MAG GLYCINATE) 100 MG TABS Take 200-300 mg by mouth at bedtime. 90 tablet 1   Magnesium  Glycinate 100 MG CAPS Take 2-3 capsules by mouth at bedtime. 90 capsule 1   methocarbamol  (ROBAXIN ) 750 MG tablet Take 1 tablet (750 mg total) by mouth 2 (two) times daily as needed for musce spasms. 20 tablet 2   methotrexate (RHEUMATREX) 2.5 MG tablet Take 4 tablets (10 mg total) by mouth once a week. 52 tablet 1   sertraline  (ZOLOFT ) 100 MG  tablet Take 2 tablets (200 mg total) by mouth every morning after meals 60 tablet 1   sertraline  (ZOLOFT ) 100 MG tablet Take 2 tablets by mouth every morning after meals 60 tablet 1   sertraline  (ZOLOFT ) 100 MG tablet Take 2 tablets (200 mg total) by mouth every morning after a meal. 60 tablet 1   sertraline  (ZOLOFT ) 100 MG tablet Take 2 tablets (200 mg total) by mouth daily after breakfast. 60 tablet 1   triamcinolone  ointment (KENALOG ) 0.1 % Apply 1 Application topically 2 (two) times daily. 30 g 1   atorvastatin  (LIPITOR) 80 MG tablet Take 1 tablet (80 mg total) by mouth daily. Please schedule visit with Dr. Kassy Mcenroe for additional refills. 90 tablet 0   hydrOXYzine  (ATARAX ) 25 MG tablet Take 1 tablet (25 mg total) by mouth 3 (three) times daily as needed. 90 tablet 0   memantine  (NAMENDA ) 5 MG tablet Take 1 tablet (5 mg total) by mouth 2 (two) times daily. 60 tablet 3   metFORMIN  (GLUCOPHAGE ) 500 MG tablet Take 1 tablet (500 mg total) by mouth daily with breakfast. Please schedule visit with Dr. Marylin Lathon for additional refills. 30 tablet 0   nystatin  cream (MYCOSTATIN ) Apply 1 Application topically 2 (two) times daily. 30 g 2   gabapentin  (NEURONTIN ) 300 MG capsule Take 1 capsule (300 mg total) by mouth 3 (three) times daily. 90 capsule 3   HYDROcodone -acetaminophen  (NORCO/VICODIN) 5-325 MG tablet Take 1 tablet by mouth every 8 (eight) hours as needed for moderate pain (pain score 4-6) or severe pain (pain score 7-10). 15 tablet 0   lidocaine  (LIDODERM ) 5 % Place 1 patch onto the skin daily. Remove & Discard patch within 12 hours or as directed by MD 30 patch 0   methylPREDNISolone  (MEDROL  DOSEPAK) 4 MG TBPK tablet Follow package directions 21 tablet  0   Semaglutide ,0.25 or 0.5MG /DOS, (OZEMPIC , 0.25 OR 0.5 MG/DOSE,) 2 MG/3ML SOPN Inject 0.25 mg into the skin once a week. (Patient not taking: Reported on 09/13/2024) 3 mL 3   sulfamethoxazole -trimethoprim  (BACTRIM  DS) 800-160 MG tablet Take 1 tablet by  mouth 2 (two) times daily. 14 tablet 0   No facility-administered medications prior to visit.     ROS Review of Systems  Constitutional:  Negative for activity change and appetite change.  HENT:  Negative for sinus pressure and sore throat.   Respiratory:  Negative for chest tightness, shortness of breath and wheezing.   Cardiovascular:  Negative for chest pain and palpitations.  Gastrointestinal:  Negative for abdominal distention, abdominal pain and constipation.  Genitourinary: Negative.   Musculoskeletal: Negative.   Psychiatric/Behavioral:  Negative for behavioral problems and dysphoric mood.     Objective:  BP 135/80   Pulse 84   Temp 98 F (36.7 C) (Oral)   Ht 5' 2 (1.575 m)   Wt 287 lb (130.2 kg)   SpO2 97%   BMI 52.49 kg/m      09/13/2024   11:26 AM 06/24/2024    2:01 AM 06/23/2024    9:36 PM  BP/Weight  Systolic BP 135 141 135  Diastolic BP 80 83 79  Wt. (Lbs) 287    BMI 52.49 kg/m2        Physical Exam Constitutional:      Appearance: She is well-developed.  Cardiovascular:     Rate and Rhythm: Normal rate.     Heart sounds: Normal heart sounds. No murmur heard. Pulmonary:     Effort: Pulmonary effort is normal.     Breath sounds: Normal breath sounds. No wheezing or rales.  Chest:     Chest wall: No tenderness.  Abdominal:     General: Bowel sounds are normal. There is no distension.     Palpations: Abdomen is soft. There is no mass.     Tenderness: There is no abdominal tenderness.  Musculoskeletal:        General: Normal range of motion.     Right lower leg: No edema.     Left lower leg: No edema.  Neurological:     Mental Status: She is alert and oriented to person, place, and time.  Psychiatric:        Mood and Affect: Mood normal.        Latest Ref Rng & Units 03/31/2023    1:56 PM 09/30/2022   11:33 AM 06/25/2022    9:45 AM  CMP  Glucose 70 - 99 mg/dL 95  97  94   BUN 8 - 27 mg/dL 21  10  11    Creatinine 0.57 - 1.00 mg/dL 9.22   9.30  9.22   Sodium 134 - 144 mmol/L 140  140  144   Potassium 3.5 - 5.2 mmol/L 4.7  4.3  4.0   Chloride 96 - 106 mmol/L 102  101  103   CO2 20 - 29 mmol/L 23  26  23    Calcium  8.7 - 10.3 mg/dL 9.6  9.7  9.2   Total Protein 6.0 - 8.5 g/dL 7.2  7.2  7.6   Total Bilirubin 0.0 - 1.2 mg/dL 0.6  0.4  0.4   Alkaline Phos 44 - 121 IU/L 118  91  91   AST 0 - 40 IU/L 23  22  27    ALT 0 - 32 IU/L 14  14  23  Lipid Panel     Component Value Date/Time   CHOL 245 (H) 03/31/2023 1356   TRIG 100 03/31/2023 1356   HDL 51 03/31/2023 1356   LDLCALC 176 (H) 03/31/2023 1356    CBC    Component Value Date/Time   WBC 8.1 03/31/2023 1356   RBC 4.72 03/31/2023 1356   HGB 13.3 03/31/2023 1356   HCT 40.0 03/31/2023 1356   PLT 354 03/31/2023 1356   MCV 85 03/31/2023 1356   MCH 28.2 03/31/2023 1356   MCHC 33.3 03/31/2023 1356   RDW 11.9 03/31/2023 1356   LYMPHSABS 1.4 03/31/2023 1356   EOSABS 0.1 03/31/2023 1356   BASOSABS 0.1 03/31/2023 1356    Lab Results  Component Value Date   HGBA1C 6.0 09/13/2024   Lab Results  Component Value Date   HGBA1C 6.0 09/13/2024   HGBA1C 6.8 (H) 10/04/2023   HGBA1C 6.0 (H) 03/31/2023       Assessment & Plan  Type 2 diabetes mellitus A1c improved to 6.0. Restarted Ozempic  post-surgery. Plan to increase dose to aid in weight loss. Discontinued metformin  to prevent hypoglycemia. - Increase Ozempic  to 0.5 mg in the first week of November. - Increase Ozempic  to 1 mg in the first week of December. - Discontinue metformin . -Counseled on Diabetic diet, the healthy plate, 849 minutes of moderate intensity exercise/week Blood sugar logs with fasting goals of 80-120 mg/dl, random of less than 819 and in the event of sugars less than 60 mg/dl or greater than 599 mg/dl encouraged to notify the clinic. Advised on the need for annual eye exams, annual foot exams, Pneumonia vaccine.   Morbid obesity Weight management is a priority. Plan to increase Ozempic   dose to aid in weight loss. - Increase Ozempic  dose as per diabetes management plan.  Hyperlipidemia associated with type 2 diabetes mellitus Uncontrolled Cholesterol levels need monitoring. Recent levels unavailable due to non-fasting state. - Order fasting blood work to check cholesterol, kidney, and liver function.  Generalized anxiety disorder Continues on hydroxyzine  for anxiety management. - Refill hydroxyzine .  Memory impairment, at risk for Alzheimer's disease Continues on Namenda . No significant memory issues reported. Sister assists with medication management. - Refill Namenda .  General Health Maintenance Screening for colon cancer-- Refer to Dr. Kristie for colonoscopy. Need for immunization against influenza-flu shot administered.         Meds ordered this encounter  Medications   atorvastatin  (LIPITOR) 80 MG tablet    Sig: Take 1 tablet (80 mg total) by mouth daily.    Dispense:  90 tablet    Refill:  1   hydrOXYzine  (ATARAX ) 25 MG tablet    Sig: Take 1 tablet (25 mg total) by mouth 3 (three) times daily as needed.    Dispense:  90 tablet    Refill:  3   memantine  (NAMENDA ) 5 MG tablet    Sig: Take 1 tablet (5 mg total) by mouth 2 (two) times daily.    Dispense:  60 tablet    Refill:  3   nystatin  cream (MYCOSTATIN )    Sig: Apply 1 Application topically 2 (two) times daily.    Dispense:  30 g    Refill:  2   Semaglutide ,0.25 or 0.5MG /DOS, (OZEMPIC , 0.25 OR 0.5 MG/DOSE,) 2 MG/3ML SOPN    Sig: Inject 0.5 mg into the skin once a week. For 4 weeks then increase to 1mg     Dispense:  3 mL    Refill:  0   Semaglutide , 1 MG/DOSE, 4  MG/3ML SOPN    Sig: Inject 1 mg as directed once a week.    Dispense:  3 mL    Refill:  3    Follow-up: Return in about 6 months (around 03/14/2025) for Chronic medical conditions.       Corrina Sabin, MD, FAAFP. [facility] [place], [place] [phone]   09/13/2024, 12:18 PM

## 2024-09-14 ENCOUNTER — Ambulatory Visit

## 2024-09-14 ENCOUNTER — Other Ambulatory Visit (HOSPITAL_COMMUNITY): Payer: Self-pay

## 2024-09-15 ENCOUNTER — Other Ambulatory Visit: Payer: Self-pay

## 2024-09-15 ENCOUNTER — Other Ambulatory Visit (HOSPITAL_COMMUNITY): Payer: Self-pay

## 2024-09-15 ENCOUNTER — Ambulatory Visit: Attending: Family Medicine

## 2024-09-15 DIAGNOSIS — E1169 Type 2 diabetes mellitus with other specified complication: Secondary | ICD-10-CM | POA: Diagnosis not present

## 2024-09-17 LAB — CMP14+EGFR
ALT: 18 IU/L (ref 0–32)
AST: 23 IU/L (ref 0–40)
Albumin: 4 g/dL (ref 3.9–4.9)
Alkaline Phosphatase: 131 IU/L (ref 49–135)
BUN/Creatinine Ratio: 19 (ref 12–28)
BUN: 15 mg/dL (ref 8–27)
Bilirubin Total: 0.3 mg/dL (ref 0.0–1.2)
CO2: 25 mmol/L (ref 20–29)
Calcium: 9.1 mg/dL (ref 8.7–10.3)
Chloride: 104 mmol/L (ref 96–106)
Creatinine, Ser: 0.79 mg/dL (ref 0.57–1.00)
Globulin, Total: 2.6 g/dL (ref 1.5–4.5)
Glucose: 99 mg/dL (ref 70–99)
Potassium: 4.8 mmol/L (ref 3.5–5.2)
Sodium: 144 mmol/L (ref 134–144)
Total Protein: 6.6 g/dL (ref 6.0–8.5)
eGFR: 84 mL/min/1.73 (ref >59)

## 2024-09-17 LAB — LP+NON-HDL CHOLESTEROL
Cholesterol, Total: 232 mg/dL — ABNORMAL HIGH (ref 100–199)
HDL: 51 mg/dL (ref >39)
LDL Chol Calc (NIH): 152 mg/dL — ABNORMAL HIGH (ref 0–99)
Total Non-HDL-Chol (LDL+VLDL): 181 mg/dL — ABNORMAL HIGH (ref 0–129)
Triglycerides: 159 mg/dL — ABNORMAL HIGH (ref 0–149)
VLDL Cholesterol Cal: 29 mg/dL (ref 5–40)

## 2024-09-17 LAB — MICROALBUMIN / CREATININE URINE RATIO
Creatinine, Urine: 104.1 mg/dL
Microalb/Creat Ratio: 3 mg/g{creat} (ref 0–29)
Microalbumin, Urine: 3.4 ug/mL

## 2024-09-18 ENCOUNTER — Ambulatory Visit: Payer: Self-pay | Admitting: Family Medicine

## 2024-09-18 ENCOUNTER — Other Ambulatory Visit: Payer: Self-pay

## 2024-09-18 ENCOUNTER — Encounter: Payer: Self-pay | Admitting: Radiology

## 2024-09-18 ENCOUNTER — Other Ambulatory Visit (HOSPITAL_COMMUNITY): Payer: Self-pay

## 2024-09-18 DIAGNOSIS — E7841 Elevated Lipoprotein(a): Secondary | ICD-10-CM

## 2024-09-18 MED ORDER — ROSUVASTATIN CALCIUM 20 MG PO TABS
20.0000 mg | ORAL_TABLET | Freq: Every day | ORAL | 1 refills | Status: AC
  Filled 2024-09-18 (×2): qty 90, 90d supply, fill #0
  Filled 2024-12-07: qty 90, 90d supply, fill #1

## 2024-09-20 LAB — SPECIMEN STATUS REPORT

## 2024-09-20 LAB — LIPOPROTEIN A (LPA): Lipoprotein (a): 403.3 nmol/L — ABNORMAL HIGH (ref <75.0)

## 2024-09-26 ENCOUNTER — Encounter (HOSPITAL_BASED_OUTPATIENT_CLINIC_OR_DEPARTMENT_OTHER): Payer: Self-pay | Admitting: Internal Medicine

## 2024-09-26 ENCOUNTER — Telehealth (HOSPITAL_BASED_OUTPATIENT_CLINIC_OR_DEPARTMENT_OTHER): Payer: Self-pay | Admitting: *Deleted

## 2024-09-26 ENCOUNTER — Ambulatory Visit (HOSPITAL_BASED_OUTPATIENT_CLINIC_OR_DEPARTMENT_OTHER): Admitting: Internal Medicine

## 2024-09-26 VITALS — BP 108/68 | HR 79 | Ht 62.0 in | Wt 207.0 lb

## 2024-09-26 DIAGNOSIS — Z5181 Encounter for therapeutic drug level monitoring: Secondary | ICD-10-CM | POA: Diagnosis not present

## 2024-09-26 DIAGNOSIS — E7849 Other hyperlipidemia: Secondary | ICD-10-CM | POA: Diagnosis not present

## 2024-09-26 DIAGNOSIS — E7841 Elevated Lipoprotein(a): Secondary | ICD-10-CM | POA: Diagnosis not present

## 2024-09-26 NOTE — Telephone Encounter (Signed)
 Patient was in to see Dr Mona who wants to start patient on Repatha  Message forwarded to PA team to initiate PA

## 2024-09-26 NOTE — Progress Notes (Signed)
 LIPID CLINIC CONSULT NOTE  Chief Complaint:  Manage dyslipidemia  Primary Care Physician: Delbert Clam, MD  Primary Cardiologist:  None  HPI:  Connie Cook is a 63 y.o. female who is being seen today for the evaluation of dyslipidemia at the request of Delbert Clam, MD. this is a pleasant 63 year old female kindly referred for evaluation management of dyslipidemia.  She has a history of high cholesterol with LDL over 190 suggestive of familial hyperlipidemia in the past however recently she was placed on rosuvastatin 20 mg daily.  Repeat lipids afterwards showed total cholesterol 232, triglycerides 159, HDL 51 and LDL 152.  She subsequently had an LP(a) assessed which is high at 403 nmol/L.  She is on low-dose aspirin 81 mg daily which I would highly suggest continuing because of the prothrombotic risk of LP(a).  This could help reduce stroke and blood clotting.  Additionally she is on some Abilify .  This medication might cause secondary hyperlipidemia as well.  She has been recently trying to lose weight on Ozempic .  She did have to stop that for her back surgery but is planning to restart it.  This could be helpful for her dyslipidemia as well.  She notes high cholesterol in her family.  There is no clear early onset heart disease.  PMHx:  Past Medical History:  Diagnosis Date   Allergy  07/23/1988   Anxiety    Arthritis    Asthma 07/18/2005   DDD (degenerative disc disease), cervical    Depression    H/O adenoidectomy    History of removal of ovarian cyst    Hyperlipidemia    Sleep apnea 2010    Past Surgical History:  Procedure Laterality Date   ADENOIDECTOMY     APPENDECTOMY     TONSILLECTOMY      FAMHx:  Family History  Problem Relation Age of Onset   Pancreatic cancer Mother    Allergic rhinitis Mother    Depression Mother    Cancer Mother    COPD Mother    Hyperlipidemia Mother    Hypertension Mother    Lung cancer Father        Smoker    Arthritis Father        Psoriatic arthritis   Parkinson's disease Father    Alcohol abuse Father    Cancer Father    COPD Father    Depression Father    Hearing loss Father    Hyperlipidemia Father    Lung cancer Maternal Uncle    Cancer Maternal Uncle    Congestive Heart Failure Paternal Grandmother    Arthritis Paternal Grandmother    Transient ischemic attack Maternal Grandmother    Diabetes Maternal Grandmother    Stroke Maternal Grandmother    Liver disease Paternal Grandfather    Anxiety disorder Niece    Depression Niece    ADD / ADHD Nephew    Colon cancer Maternal Uncle    Stomach cancer Maternal Uncle    Heart disease Other    Throat cancer Other        Smoker   Bladder Cancer Paternal Uncle    Alcohol abuse Paternal Uncle    Cancer Paternal Uncle    Depression Paternal Uncle    Cancer Maternal Uncle    Early death Maternal Uncle    Hyperlipidemia Sister    Hypertension Sister    Kidney disease Maternal Aunt     SOCHx:   reports that she has never smoked. She has been  exposed to tobacco smoke. She has never used smokeless tobacco. She reports that she does not drink alcohol and does not use drugs.  ALLERGIES:  Allergies  Allergen Reactions   Codeine Shortness Of Breath   Penicillins Rash   Prednisone Other (See Comments)    Has tolerated Depomedrol in past.   Tramadol Itching    ROS: Pertinent items noted in HPI and remainder of comprehensive ROS otherwise negative.  HOME MEDS: Current Outpatient Medications on File Prior to Visit  Medication Sig Dispense Refill   albuterol  (VENTOLIN  HFA) 108 (90 Base) MCG/ACT inhaler Inhale 2 puffs into the lungs every 4 (four) hours as needed for wheezing or shortness of breath. 6.7 g 3   ARIPiprazole  (ABILIFY ) 15 MG tablet Take 1 tablet by mouth once a day at bedtime 30 tablet 1   Blood Glucose Monitoring Suppl (ONETOUCH VERIO) w/Device KIT USE AS DIRECTED 1 kit 0   budesonide -formoterol  (SYMBICORT ) 80-4.5  MCG/ACT inhaler Inhale 2 puffs into the lungs 2 (two) times daily with spacer and rinse mouth afterwards. 10.2 g 3   cyanocobalamin  (VITAMIN B12) 1000 MCG tablet Take 1 tablet (1,000 mcg total) by mouth daily. 90 tablet 1   folic acid (FOLVITE) 1 MG tablet Take 1 tablet (1 mg total) by mouth daily. 90 tablet 3   glucose blood (ONETOUCH ULTRA BLUE TEST) test strip Use as instructed 100 each 12   hydrOXYzine  (ATARAX ) 25 MG tablet Take 1 tablet (25 mg total) by mouth 3 (three) times daily as needed. 90 tablet 3   ibuprofen (ADVIL) 200 MG tablet Take 200 mg by mouth every 6 (six) hours as needed.     ipratropium (ATROVENT ) 0.03 % nasal spray Place 1-2 sprays into both nostrils 2 (two) times daily as needed (nasal drainage). 30 mL 5   Lancets (ONETOUCH ULTRASOFT) lancets Use as instructed daily 100 each 12   Magnesium  Bisglycinate (MAG GLYCINATE) 100 MG TABS Take 200-300 mg by mouth at bedtime. 90 tablet 1   memantine  (NAMENDA ) 5 MG tablet Take 1 tablet (5 mg total) by mouth 2 (two) times daily. 60 tablet 3   methocarbamol  (ROBAXIN ) 750 MG tablet Take 1 tablet (750 mg total) by mouth 2 (two) times daily as needed for musce spasms. 20 tablet 2   methotrexate (RHEUMATREX) 2.5 MG tablet Take 4 tablets (10 mg total) by mouth once a week. 52 tablet 1   nystatin  cream (MYCOSTATIN ) Apply 1 Application topically 2 (two) times daily. 30 g 2   rosuvastatin (CRESTOR) 20 MG tablet Take 1 tablet (20 mg total) by mouth daily. 90 tablet 1   Semaglutide ,0.25 or 0.5MG /DOS, (OZEMPIC , 0.25 OR 0.5 MG/DOSE,) 2 MG/3ML SOPN Inject 0.5 mg into the skin once a week. For 4 weeks then increase to 1mg  3 mL 0   sertraline  (ZOLOFT ) 100 MG tablet Take 2 tablets (200 mg total) by mouth every morning after a meal. 60 tablet 1   triamcinolone  ointment (KENALOG ) 0.1 % Apply 1 Application topically 2 (two) times daily. 30 g 1   ARIPiprazole  (ABILIFY ) 15 MG tablet Take 1 tablet orally once a day at bedtime (Patient not taking: Reported on  09/26/2024) 30 tablet 1   ARIPiprazole  (ABILIFY ) 15 MG tablet Take 1 tablet (15 mg total) by mouth at bedtime. (Patient not taking: Reported on 09/26/2024) 30 tablet 1   hydrOXYzine  (ATARAX ) 25 MG tablet Take 1/2  tablet  by mouth daily as needed for anxiety attacks, and 1/2 tablet at bedtime for sleep/anxiety (Patient not  taking: Reported on 09/26/2024) 30 tablet 1   LORazepam  (ATIVAN ) 1 MG tablet Take 1 mg by mouth daily. (Patient not taking: Reported on 09/26/2024)     LORazepam  (ATIVAN ) 1 MG tablet Take 1/2-1 tablet (1 mg total) by mouth at bedtime as needed. (Patient not taking: Reported on 09/26/2024) 30 tablet 1   Magnesium  Glycinate 100 MG CAPS Take 2-3 capsules by mouth at bedtime. (Patient not taking: Reported on 09/26/2024) 90 capsule 1   Semaglutide , 1 MG/DOSE, 4 MG/3ML SOPN Inject 1 mg as directed once a week. (Patient not taking: Reported on 09/26/2024) 3 mL 3   Semaglutide ,0.25 or 0.5MG /DOS, (OZEMPIC , 0.25 OR 0.5 MG/DOSE,) 2 MG/3ML SOPN Inject 0.25 mg into the skin once a week. (Patient not taking: Reported on 09/26/2024) 3 mL 3   sertraline  (ZOLOFT ) 100 MG tablet Take 2 tablets (200 mg total) by mouth every morning after meals (Patient not taking: Reported on 09/26/2024) 60 tablet 1   sertraline  (ZOLOFT ) 100 MG tablet Take 2 tablets by mouth every morning after meals (Patient not taking: Reported on 09/26/2024) 60 tablet 1   sertraline  (ZOLOFT ) 100 MG tablet Take 2 tablets (200 mg total) by mouth daily after breakfast. (Patient not taking: Reported on 09/26/2024) 60 tablet 1   No current facility-administered medications on file prior to visit.    LABS/IMAGING: No results found for this or any previous visit (from the past 48 hours). No results found.  LIPID PANEL:    Component Value Date/Time   CHOL 232 (H) 09/15/2024 0900   TRIG 159 (H) 09/15/2024 0900   HDL 51 09/15/2024 0900   LDLCALC 152 (H) 09/15/2024 0900    Lipoprotein (a)  Date/Time Value Ref Range Status   09/15/2024 09:00 AM 403.3 (H) <75.0 nmol/L Final    Comment:    Note:  Values greater than or equal to 75.0 nmol/L may        indicate an independent risk factor for CHD,        but must be evaluated with caution when applied        to non-Caucasian populations due to the        influence of genetic factors on Lp(a) across        ethnicities.      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   Connie Cook has a dyslipidemia with a target LDL less than 70.  She has had less than expected reduction in her cholesterol on rosuvastatin 20 mg daily.  Based on that she is a good candidate to add a PCSK9 inhibitor for further lipid-lowering.  I would advise Repatha 140 mg every 2 weeks.  This could also help lower LP(a).  Will reach out for prior authorization.  Plan repeat lipids including NMR and LP(a) in about 3 months.  Follow-up with our lipid APP.  Thanks again for the kind referral.  Connie KYM Maxcy, MD, [facility], FNLA, [facility]  [facility]  Medical Director of the Advanced Lipid Disorders &  Cardiovascular Risk Reduction Clinic Diplomate of the American Board of Clinical Lipidology Attending Cardiologist  Direct Dial: [phone]  Fax: [phone]  Website:  [email or web address] Connie Cook 09/26/2024, 3:39 PM

## 2024-09-26 NOTE — Patient Instructions (Signed)
 Medication Instructions:  START REPATHA 140 MG EVERY 14 DAYS ONCE WE GET PRIOR AUTHORIZATION   *If you need a refill on your cardiac medications before your next appointment, please call your pharmacy*  Lab Work: Your physician recommends that you return for lab work in: FASTING IN 3 MONTHS    NMR Lipoprofile and Lp(a)  If you have labs (blood work) drawn today and your tests are completely normal, you will receive your results only by: MyChart Message (if you have MyChart) OR A paper copy in the mail If you have any lab test that is abnormal or we need to change your treatment, we will call you to review the results.  Testing/Procedures: NONE   Follow-Up: At [facility], you and your health needs are our priority.  As part of our continuing mission to provide you with exceptional heart care, our providers are all part of one team.  This team includes your primary Cardiologist (physician) and Advanced Practice Providers or APPs (Physician Assistants and Nurse Practitioners) who all work together to provide you with the care you need, when you need it.  Your next appointment:   6 month(s)  Provider:   Rosaline Bane, NP in Lipid Clinic  We recommend signing up for the patient portal called MyChart.  Sign up information is provided on this After Visit Summary.  MyChart is used to connect with patients for Virtual Visits (Telemedicine).  Patients are able to view lab/test results, encounter notes, upcoming appointments, etc.  Non-urgent messages can be sent to your provider as well.   To learn more about what you can do with MyChart, go to [email or web address].

## 2024-09-27 ENCOUNTER — Other Ambulatory Visit (HOSPITAL_COMMUNITY): Payer: Self-pay

## 2024-09-27 ENCOUNTER — Telehealth: Payer: Self-pay | Admitting: Pharmacy Technician

## 2024-09-27 DIAGNOSIS — E7841 Elevated Lipoprotein(a): Secondary | ICD-10-CM

## 2024-09-27 DIAGNOSIS — E7849 Other hyperlipidemia: Secondary | ICD-10-CM

## 2024-09-27 DIAGNOSIS — K080 Exfoliation of teeth due to systemic causes: Secondary | ICD-10-CM | POA: Diagnosis not present

## 2024-09-27 NOTE — Telephone Encounter (Signed)
 Pharmacy Patient Advocate Encounter   Received notification from Physician's Office that prior authorization for repatha is required/requested.   Insurance verification completed.   The patient is insured through [employer].   Per test claim: PA required; PA submitted to above mentioned insurance via Latent Key/confirmation #/EOC [id number] Status is pending

## 2024-09-27 NOTE — Telephone Encounter (Signed)
 Pharmacy Patient Advocate Encounter  Received notification from [facility] that Prior Authorization for repatha has been APPROVED from 09/27/24 to 09/27/25. Ran test claim, Copay is $12.15- one month. This test claim was processed through [facility]- copay amounts may vary at other pharmacies due to pharmacy/plan contracts, or as the patient moves through the different stages of their insurance plan.   PA #/Case ID/Reference #: [id number]

## 2024-09-28 ENCOUNTER — Other Ambulatory Visit (HOSPITAL_COMMUNITY): Payer: Self-pay

## 2024-09-28 ENCOUNTER — Other Ambulatory Visit: Payer: Self-pay

## 2024-09-28 DIAGNOSIS — F410 Panic disorder [episodic paroxysmal anxiety] without agoraphobia: Secondary | ICD-10-CM | POA: Diagnosis not present

## 2024-09-28 DIAGNOSIS — F4312 Post-traumatic stress disorder, chronic: Secondary | ICD-10-CM | POA: Diagnosis not present

## 2024-09-28 DIAGNOSIS — F411 Generalized anxiety disorder: Secondary | ICD-10-CM | POA: Diagnosis not present

## 2024-09-28 DIAGNOSIS — F331 Major depressive disorder, recurrent, moderate: Secondary | ICD-10-CM | POA: Diagnosis not present

## 2024-09-28 MED ORDER — MAGNESIUM GLYCINATE 100 MG PO CAPS: 2.0000 | ORAL_CAPSULE | Freq: Every evening | ORAL | 2 refills | Status: AC

## 2024-09-28 MED ORDER — ARIPIPRAZOLE 15 MG PO TABS
15.0000 mg | ORAL_TABLET | Freq: Every day | ORAL | 2 refills | Status: AC
  Filled 2024-09-28: qty 30, 30d supply, fill #0

## 2024-09-28 MED ORDER — SERTRALINE HCL 100 MG PO TABS
200.0000 mg | ORAL_TABLET | ORAL | 2 refills | Status: AC
  Filled 2024-11-06 – 2024-12-12 (×2): qty 60, 30d supply, fill #0

## 2024-09-28 MED ORDER — HYDROXYZINE HCL 25 MG PO TABS
ORAL_TABLET | ORAL | 2 refills | Status: AC
  Filled 2024-11-16: qty 30, 30d supply, fill #0

## 2024-09-29 NOTE — Telephone Encounter (Signed)
 Pt calling to request Repatha be sent to [facility]. Please advise.

## 2024-09-29 NOTE — Telephone Encounter (Signed)
 Patient reviewed mychart message Repatha approved however she did not respond to where she wanted sent.  Left message to call back with pharmacy or respond to mychart message

## 2024-10-02 ENCOUNTER — Other Ambulatory Visit (HOSPITAL_COMMUNITY): Payer: Self-pay

## 2024-10-02 MED ORDER — REPATHA SURECLICK 140 MG/ML ~~LOC~~ SOAJ
1.0000 mL | SUBCUTANEOUS | 1 refills | Status: AC
  Filled 2024-10-02: qty 6, 84d supply, fill #0
  Filled 2024-11-16 – 2024-12-20 (×2): qty 6, 84d supply, fill #1

## 2024-10-02 NOTE — Addendum Note (Signed)
 Addended by: DARRELL BRUCKNER on: 10/02/2024 07:54 AM   Modules accepted: Orders

## 2024-10-04 DIAGNOSIS — K080 Exfoliation of teeth due to systemic causes: Secondary | ICD-10-CM | POA: Diagnosis not present

## 2024-10-05 DIAGNOSIS — Z1211 Encounter for screening for malignant neoplasm of colon: Secondary | ICD-10-CM | POA: Diagnosis not present

## 2024-10-05 NOTE — Telephone Encounter (Signed)
 Repatha was sent to pharmacy 11/17

## 2024-10-14 DIAGNOSIS — Z1211 Encounter for screening for malignant neoplasm of colon: Secondary | ICD-10-CM | POA: Diagnosis not present

## 2024-10-14 DIAGNOSIS — Z1212 Encounter for screening for malignant neoplasm of rectum: Secondary | ICD-10-CM | POA: Diagnosis not present

## 2024-10-18 DIAGNOSIS — L4059 Other psoriatic arthropathy: Secondary | ICD-10-CM | POA: Diagnosis not present

## 2024-10-23 ENCOUNTER — Other Ambulatory Visit (HOSPITAL_COMMUNITY): Payer: Self-pay

## 2024-10-23 ENCOUNTER — Other Ambulatory Visit: Payer: Self-pay

## 2024-11-06 ENCOUNTER — Other Ambulatory Visit (HOSPITAL_COMMUNITY): Payer: Self-pay

## 2024-11-17 ENCOUNTER — Other Ambulatory Visit (HOSPITAL_COMMUNITY): Payer: Self-pay

## 2024-11-17 ENCOUNTER — Other Ambulatory Visit: Payer: Self-pay

## 2024-11-20 ENCOUNTER — Other Ambulatory Visit: Payer: Self-pay

## 2024-12-06 ENCOUNTER — Ambulatory Visit: Payer: Self-pay

## 2024-12-06 NOTE — Telephone Encounter (Signed)
 FYI Only or Action Required?: FYI only for provider: appointment scheduled on 1/22.  Patient was last seen in primary care on 09/13/2024 by Newlin, Enobong, MD.  Called Nurse Triage reporting Cyst.  Symptoms began several days ago.  Interventions attempted: OTC medications: ibuprofen and Ice/heat application.  Symptoms are: gradually worsening.  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 Comes and goes. Has been present since birth. 3/10 pain. No fever. Clear yellow drainage. Bigger than the tip of a pencil. Scheduled virtual appt at the [facility] tomorrow d/t no availability at home office with any provider within timeframe. Advised UC or ED for worsening symptoms.    Copied from CRM #[id number]. Topic: Clinical - Medication Question >> Dec 06, 2024  4:55 PM Travis F wrote: Reason for CRM: Patient is calling in requesting an antibiotic because she has a Cyst on her ear that she believes is in infected. Reason for Disposition  [1] Boil AND [2] diabetes mellitus or weak immune system (e.g., HIV positive, cancer chemo, splenectomy, organ transplant, chronic steroids)  Answer Assessment - Initial Assessment Questions 1. APPEARANCE of BOIL: What does the boil look like?      Red cyst  2. LOCATION: Where is the boil located?      Left outer front ear  3. NUMBER: How many boils are there?      1 4. SIZE: How big is the boil? (e.g., inches, cm; compare to size of a coin or other object)     A little bigger than tip of a pencil  5. ONSET: When did the boil start?     Weekend  6. PAIN: Is there any pain? If Yes, ask: How bad is the pain?   (Scale 1-10; or mild, moderate, severe)     3/10  7. FEVER: Do you have a fever? If Yes, ask: What is it, how was it measured, and when did it start?      Denies  8. SOURCE: Have you been around anyone with boils or  other Staph infections? Have you ever had boils before?     Denies  9. OTHER SYMPTOMS: Do you have any other symptoms? (e.g., shaking chills, weakness, rash elsewhere on body)     Denies  Protocols used: Boil (Skin Abscess)-A-AH

## 2024-12-07 ENCOUNTER — Telehealth (INDEPENDENT_AMBULATORY_CARE_PROVIDER_SITE_OTHER): Payer: Self-pay | Admitting: Nurse Practitioner

## 2024-12-07 ENCOUNTER — Other Ambulatory Visit: Payer: Self-pay

## 2024-12-07 ENCOUNTER — Other Ambulatory Visit: Payer: Self-pay | Admitting: Neurosurgery

## 2024-12-07 ENCOUNTER — Encounter: Payer: Self-pay | Admitting: Nurse Practitioner

## 2024-12-07 DIAGNOSIS — M5127 Other intervertebral disc displacement, lumbosacral region: Secondary | ICD-10-CM

## 2024-12-07 DIAGNOSIS — L03211 Cellulitis of face: Secondary | ICD-10-CM

## 2024-12-07 MED ORDER — DOXYCYCLINE HYCLATE 100 MG PO TABS
100.0000 mg | ORAL_TABLET | Freq: Two times a day (BID) | ORAL | 0 refills | Status: AC
  Filled 2024-12-07: qty 14, 7d supply, fill #0

## 2024-12-07 NOTE — Progress Notes (Signed)
 " Virtual Visit Consent   Connie Cook, you are scheduled for a virtual visit with a [facility] provider today. Just as with appointments in the office, your consent must be obtained to participate. Your consent will be active for this visit and any virtual visit you may have with one of our providers in the next 365 days. If you have a MyChart account, a copy of this consent can be sent to you electronically.  As this is a virtual visit, video technology does not allow for your provider to perform a traditional examination. This may limit your provider's ability to fully assess your condition. If your provider identifies any concerns that need to be evaluated in person or the need to arrange testing (such as labs, EKG, etc.), we will make arrangements to do so. Although advances in technology are sophisticated, we cannot ensure that it will always work on either your end or our end. If the connection with a video visit is poor, the visit may have to be switched to a telephone visit. With either a video or telephone visit, we are not always able to ensure that we have a secure connection.  By engaging in this virtual visit, you consent to the provision of healthcare and authorize for your insurance to be billed (if applicable) for the services provided during this visit. Depending on your insurance coverage, you may receive a charge related to this service.  I need to obtain your verbal consent now. Are you willing to proceed with your visit today? Gloriana [name] Matt has provided verbal consent on 12/07/2024 for a virtual visit (video or telephone). Haze LELON Servant, NP  Date: 12/07/2024 10:28 AM   Virtual Visit via Video Note   I, Haze LELON Servant, connected with  Connie Cook  ([id number], 12/07/1960) on 12/07/24 at 10:00 AM EST by a video-enabled telemedicine application and verified that I am speaking with the correct person using two identifiers.  Location: Patient: Virtual  Visit Location Patient: Home Provider: Virtual Visit Location Provider: Home Office   I discussed the limitations of evaluation and management by telemedicine and the availability of in person appointments. The patient expressed understanding and agreed to proceed.    History of Present Illness: Connie Cook is a 64 y.o. who identifies as a female who was assigned female at birth, and is being seen today for abscess of left preauricular cyst.  She has been experiencing pain swelling redness and purulent drainage since 5 days ago. Despite applying warm compresses the pain, redness and swelling have persisted. She has taken antibiotics for this in the past with complete resolution.    Problems:  Patient Active Problem List   Diagnosis Date Noted   Memory loss of unknown cause 01/11/2024   Pure hypercholesterolemia 06/26/2022   Type 2 diabetes mellitus (HCC) 06/26/2022   Tendinopathy of left rotator cuff 06/25/2022   Weight gain 04/29/2022   PTSD (post-traumatic stress disorder) 01/06/2021   Generalized anxiety disorder 01/06/2021   Major depressive disorder, recurrent episode, severe (HCC) 11/22/2020    Allergies: Allergies[1] Medications: Current Medications[2]  Observations/Objective: Patient is well-developed, well-nourished in no acute distress.  Resting comfortably at home.  Head is normocephalic, atraumatic.  No labored breathing.  Speech is clear and coherent with logical content.  Patient is alert and oriented at baseline.  Preauricular Left ear is red and swollen    Assessment and Plan: 1. Preauricular cellulitis (Primary) - doxycycline  (VIBRA -TABS) 100 MG tablet; Take 1 tablet (100  mg total) by mouth 2 (two) times daily for 7 days.  Dispense: 14 tablet; Refill: 0  Apply warm compresses to the area for relief of pain. May take tylenol    Follow Up Instructions: I discussed the assessment and treatment plan with the patient. The patient was provided an  opportunity to ask questions and all were answered. The patient agreed with the plan and demonstrated an understanding of the instructions.  A copy of instructions were sent to the patient via MyChart unless otherwise noted below.     The patient was advised to call back or seek an in-person evaluation if the symptoms worsen or if the condition fails to improve as anticipated.    Haze LELON Servant, NP     [1]  Allergies Allergen Reactions   Codeine Shortness Of Breath   Penicillins Rash   Prednisone Other (See Comments)    Has tolerated Depomedrol in past.   Tramadol Itching  [2]  Current Outpatient Medications:    doxycycline  (VIBRA -TABS) 100 MG tablet, Take 1 tablet (100 mg total) by mouth 2 (two) times daily for 7 days., Disp: 14 tablet, Rfl: 0   albuterol  (VENTOLIN  HFA) 108 (90 Base) MCG/ACT inhaler, Inhale 2 puffs into the lungs every 4 (four) hours as needed for wheezing or shortness of breath., Disp: 6.7 g, Rfl: 3   ARIPiprazole  (ABILIFY ) 15 MG tablet, Take 1 tablet orally once a day at bedtime (Patient not taking: Reported on 09/26/2024), Disp: 30 tablet, Rfl: 1   ARIPiprazole  (ABILIFY ) 15 MG tablet, Take 1 tablet (15 mg total) by mouth at bedtime. (Patient not taking: Reported on 09/26/2024), Disp: 30 tablet, Rfl: 1   ARIPiprazole  (ABILIFY ) 15 MG tablet, Take 1 tablet (15 mg total) by mouth at bedtime., Disp: 30 tablet, Rfl: 2   aspirin EC 81 MG tablet, Take 81 mg by mouth daily. Swallow whole., Disp: , Rfl:    Blood Glucose Monitoring Suppl (ONETOUCH VERIO) w/Device KIT, USE AS DIRECTED, Disp: 1 kit, Rfl: 0   budesonide -formoterol  (SYMBICORT ) 80-4.5 MCG/ACT inhaler, Inhale 2 puffs into the lungs 2 (two) times daily with spacer and rinse mouth afterwards., Disp: 10.2 g, Rfl: 3   cyanocobalamin  (VITAMIN B12) 1000 MCG tablet, Take 1 tablet (1,000 mcg total) by mouth daily., Disp: 90 tablet, Rfl: 1   Evolocumab  (REPATHA  SURECLICK) 140 MG/ML SOAJ, Inject 140 mg into the skin every 14  (fourteen) days., Disp: 6 mL, Rfl: 1   folic acid  (FOLVITE ) 1 MG tablet, Take 1 tablet (1 mg total) by mouth daily., Disp: 90 tablet, Rfl: 3   glucose blood (ONETOUCH ULTRA BLUE TEST) test strip, Use as instructed, Disp: 100 each, Rfl: 12   hydrOXYzine  (ATARAX ) 25 MG tablet, Take 1 tablet (25 mg total) by mouth 3 (three) times daily as needed., Disp: 90 tablet, Rfl: 3   hydrOXYzine  (ATARAX ) 25 MG tablet, Take 0.5 tablets (12.5 mg total) by mouth at bedtime for sleep/anxiety. May also take 0.5 tablets (12.5 mg total) by mouth daily as needed for anxiety attacks., Disp: 30 tablet, Rfl: 2   ibuprofen (ADVIL) 200 MG tablet, Take 200 mg by mouth every 6 (six) hours as needed., Disp: , Rfl:    ipratropium (ATROVENT ) 0.03 % nasal spray, Place 1-2 sprays into both nostrils 2 (two) times daily as needed (nasal drainage)., Disp: 30 mL, Rfl: 5   Lancets (ONETOUCH ULTRASOFT) lancets, Use as instructed daily, Disp: 100 each, Rfl: 12   LORazepam  (ATIVAN ) 1 MG tablet, Take 1/2-1 tablet (1 mg total)  by mouth at bedtime as needed. (Patient not taking: Reported on 09/26/2024), Disp: 30 tablet, Rfl: 1   Magnesium  Glycinate 100 MG CAPS, Take 2-3 capsules by mouth at bedtime., Disp: 90 capsule, Rfl: 1   Magnesium  Glycinate 100 MG CAPS, Take 2-3 capsules by mouth at bedtime., Disp: 90 capsule, Rfl: 2   memantine  (NAMENDA ) 5 MG tablet, Take 1 tablet (5 mg total) by mouth 2 (two) times daily., Disp: 60 tablet, Rfl: 3   methocarbamol  (ROBAXIN ) 750 MG tablet, Take 1 tablet (750 mg total) by mouth 2 (two) times daily as needed for musce spasms., Disp: 20 tablet, Rfl: 2   methotrexate  (RHEUMATREX) 2.5 MG tablet, Take 4 tablets (10 mg total) by mouth once a week., Disp: 52 tablet, Rfl: 1   nystatin  cream (MYCOSTATIN ), Apply 1 Application topically 2 (two) times daily., Disp: 30 g, Rfl: 2   rosuvastatin  (CRESTOR ) 20 MG tablet, Take 1 tablet (20 mg total) by mouth daily., Disp: 90 tablet, Rfl: 1   Semaglutide , 1 MG/DOSE, 4 MG/3ML  SOPN, Inject 1 mg as directed once a week. Diagnosis E11.9 verified via secure chat with Dr Newlin on 11/18/23 ae, Disp: 3 mL, Rfl: 3   Semaglutide ,0.25 or 0.5MG /DOS, (OZEMPIC , 0.25 OR 0.5 MG/DOSE,) 2 MG/3ML SOPN, Inject 0.5 mg into the skin once a week. For 4 weeks then increase to 1mg , Disp: 3 mL, Rfl: 0   sertraline  (ZOLOFT ) 100 MG tablet, Take 2 tablets (200 mg total) by mouth every morning after a meal., Disp: 60 tablet, Rfl: 1   sertraline  (ZOLOFT ) 100 MG tablet, Take 2 tablets (200 mg total) by mouth daily after breakfast. (Patient not taking: Reported on 09/26/2024), Disp: 60 tablet, Rfl: 1   sertraline  (ZOLOFT ) 100 MG tablet, Take 2 tablets (200 mg total) by mouth every morning after meals., Disp: 60 tablet, Rfl: 2   triamcinolone  ointment (KENALOG ) 0.1 %, Apply 1 Application topically 2 (two) times daily., Disp: 30 g, Rfl: 1  "

## 2024-12-07 NOTE — Telephone Encounter (Signed)
 Noted.

## 2024-12-08 ENCOUNTER — Other Ambulatory Visit (HOSPITAL_COMMUNITY): Payer: Self-pay

## 2024-12-08 ENCOUNTER — Other Ambulatory Visit: Payer: Self-pay

## 2024-12-08 ENCOUNTER — Encounter: Payer: Self-pay | Admitting: Neurosurgery

## 2024-12-12 ENCOUNTER — Other Ambulatory Visit: Payer: Self-pay

## 2024-12-12 ENCOUNTER — Other Ambulatory Visit (HOSPITAL_COMMUNITY): Payer: Self-pay

## 2024-12-12 MED ORDER — ARIPIPRAZOLE 15 MG PO TABS: 15.0000 mg | ORAL_TABLET | Freq: Every evening | ORAL | 2 refills | Status: AC

## 2024-12-12 MED ORDER — MAGNESIUM GLYCINATE 100 MG PO CAPS
ORAL_CAPSULE | ORAL | 2 refills | Status: AC
  Filled 2024-12-12: qty 90, 30d supply, fill #0

## 2024-12-12 MED ORDER — HYDROXYZINE HCL 25 MG PO TABS: ORAL_TABLET | ORAL | 2 refills | Status: AC

## 2024-12-12 MED ORDER — SERTRALINE HCL 100 MG PO TABS: 200.0000 mg | ORAL_TABLET | Freq: Every morning | ORAL | 2 refills | Status: AC

## 2024-12-14 ENCOUNTER — Other Ambulatory Visit: Payer: Self-pay

## 2024-12-18 ENCOUNTER — Other Ambulatory Visit

## 2024-12-20 ENCOUNTER — Other Ambulatory Visit (HOSPITAL_COMMUNITY): Payer: Self-pay

## 2024-12-27 ENCOUNTER — Other Ambulatory Visit

## 2025-03-14 ENCOUNTER — Ambulatory Visit: Admitting: Family Medicine
